# Patient Record
Sex: Female | Born: 1935 | Hispanic: No | Smoking: Never smoker
Health system: Southern US, Community
[De-identification: ages and names within clinical notes are randomized; demographics above are authoritative.]

## PROBLEM LIST (undated history)

## (undated) DIAGNOSIS — I4891 Unspecified atrial fibrillation: Secondary | ICD-10-CM

## (undated) DIAGNOSIS — I10 Essential (primary) hypertension: Secondary | ICD-10-CM

## (undated) DIAGNOSIS — G629 Polyneuropathy, unspecified: Secondary | ICD-10-CM

## (undated) DIAGNOSIS — K219 Gastro-esophageal reflux disease without esophagitis: Secondary | ICD-10-CM

## (undated) DIAGNOSIS — E119 Type 2 diabetes mellitus without complications: Secondary | ICD-10-CM

## (undated) DIAGNOSIS — N179 Acute kidney failure, unspecified: Secondary | ICD-10-CM

## (undated) DIAGNOSIS — R42 Dizziness and giddiness: Secondary | ICD-10-CM

## (undated) DIAGNOSIS — K635 Polyp of colon: Secondary | ICD-10-CM

## (undated) DIAGNOSIS — L84 Corns and callosities: Secondary | ICD-10-CM

## (undated) DIAGNOSIS — N189 Chronic kidney disease, unspecified: Secondary | ICD-10-CM

## (undated) DIAGNOSIS — F0280 Dementia in other diseases classified elsewhere without behavioral disturbance: Secondary | ICD-10-CM

## (undated) DIAGNOSIS — G309 Alzheimer's disease, unspecified: Secondary | ICD-10-CM

## (undated) DIAGNOSIS — I421 Obstructive hypertrophic cardiomyopathy: Secondary | ICD-10-CM

## (undated) DIAGNOSIS — M21619 Bunion of unspecified foot: Secondary | ICD-10-CM

## (undated) DIAGNOSIS — I495 Sick sinus syndrome: Secondary | ICD-10-CM

## (undated) DIAGNOSIS — I5189 Other ill-defined heart diseases: Secondary | ICD-10-CM

## (undated) DIAGNOSIS — M797 Fibromyalgia: Secondary | ICD-10-CM

## (undated) DIAGNOSIS — L8990 Pressure ulcer of unspecified site, unspecified stage: Secondary | ICD-10-CM

## (undated) DIAGNOSIS — R4182 Altered mental status, unspecified: Secondary | ICD-10-CM

## (undated) HISTORY — PX: EYE SURGERY: SHX253

## (undated) HISTORY — DX: Acute kidney failure, unspecified: N17.9

## (undated) HISTORY — PX: COLONOSCOPY: SHX174

## (undated) HISTORY — PX: UPPER GASTROINTESTINAL ENDOSCOPY: SHX188

## (undated) HISTORY — DX: Chronic kidney disease, unspecified: N18.9

## (undated) HISTORY — DX: Sick sinus syndrome: I49.5

## (undated) HISTORY — DX: Dizziness and giddiness: R42

## (undated) HISTORY — DX: Other ill-defined heart diseases: I51.89

## (undated) HISTORY — DX: Altered mental status, unspecified: R41.82

## (undated) HISTORY — PX: SHOULDER SURGERY: SHX246

## (undated) HISTORY — DX: Pressure ulcer of unspecified site, unspecified stage: L89.90

## (undated) HISTORY — DX: Obstructive hypertrophic cardiomyopathy: I42.1

## (undated) HISTORY — PX: HEMORRHOID SURGERY: SHX153

## (undated) HISTORY — DX: Bunion of unspecified foot: M21.619

## (undated) HISTORY — DX: Corns and callosities: L84

---

## 1999-01-30 ENCOUNTER — Other Ambulatory Visit: Admission: RE | Admit: 1999-01-30 | Discharge: 1999-01-30 | Payer: Self-pay | Admitting: Gynecology

## 1999-04-17 ENCOUNTER — Encounter (INDEPENDENT_AMBULATORY_CARE_PROVIDER_SITE_OTHER): Payer: Self-pay

## 1999-04-17 ENCOUNTER — Other Ambulatory Visit: Admission: RE | Admit: 1999-04-17 | Discharge: 1999-04-17 | Payer: Self-pay | Admitting: Gynecology

## 2000-03-30 ENCOUNTER — Other Ambulatory Visit: Admission: RE | Admit: 2000-03-30 | Discharge: 2000-03-30 | Payer: Self-pay | Admitting: Gynecology

## 2002-06-09 ENCOUNTER — Other Ambulatory Visit: Admission: RE | Admit: 2002-06-09 | Discharge: 2002-06-09 | Payer: Self-pay | Admitting: Gynecology

## 2004-07-09 ENCOUNTER — Other Ambulatory Visit: Admission: RE | Admit: 2004-07-09 | Discharge: 2004-07-09 | Payer: Self-pay | Admitting: Gynecology

## 2006-10-07 ENCOUNTER — Other Ambulatory Visit: Admission: RE | Admit: 2006-10-07 | Discharge: 2006-10-07 | Payer: Self-pay | Admitting: Gynecology

## 2010-06-03 DIAGNOSIS — M1990 Unspecified osteoarthritis, unspecified site: Secondary | ICD-10-CM | POA: Insufficient documentation

## 2010-06-03 DIAGNOSIS — K5792 Diverticulitis of intestine, part unspecified, without perforation or abscess without bleeding: Secondary | ICD-10-CM | POA: Insufficient documentation

## 2010-06-03 DIAGNOSIS — M797 Fibromyalgia: Secondary | ICD-10-CM | POA: Insufficient documentation

## 2010-12-16 DIAGNOSIS — N189 Chronic kidney disease, unspecified: Secondary | ICD-10-CM

## 2010-12-16 DIAGNOSIS — R32 Unspecified urinary incontinence: Secondary | ICD-10-CM | POA: Insufficient documentation

## 2010-12-16 HISTORY — DX: Chronic kidney disease, unspecified: N18.9

## 2011-01-27 DIAGNOSIS — M5137 Other intervertebral disc degeneration, lumbosacral region: Secondary | ICD-10-CM | POA: Insufficient documentation

## 2011-05-01 DIAGNOSIS — E119 Type 2 diabetes mellitus without complications: Secondary | ICD-10-CM

## 2011-05-01 DIAGNOSIS — I459 Conduction disorder, unspecified: Secondary | ICD-10-CM | POA: Insufficient documentation

## 2011-05-01 HISTORY — DX: Type 2 diabetes mellitus without complications: E11.9

## 2011-05-07 DIAGNOSIS — I495 Sick sinus syndrome: Secondary | ICD-10-CM

## 2011-05-07 DIAGNOSIS — I517 Cardiomegaly: Secondary | ICD-10-CM | POA: Insufficient documentation

## 2011-05-07 HISTORY — DX: Sick sinus syndrome: I49.5

## 2012-05-31 DIAGNOSIS — Q248 Other specified congenital malformations of heart: Secondary | ICD-10-CM | POA: Insufficient documentation

## 2012-05-31 DIAGNOSIS — I5189 Other ill-defined heart diseases: Secondary | ICD-10-CM

## 2012-05-31 HISTORY — DX: Other ill-defined heart diseases: I51.89

## 2013-06-29 DIAGNOSIS — I10 Essential (primary) hypertension: Secondary | ICD-10-CM | POA: Diagnosis not present

## 2013-06-29 DIAGNOSIS — M5137 Other intervertebral disc degeneration, lumbosacral region: Secondary | ICD-10-CM | POA: Diagnosis not present

## 2013-06-29 DIAGNOSIS — E785 Hyperlipidemia, unspecified: Secondary | ICD-10-CM | POA: Diagnosis not present

## 2013-06-29 DIAGNOSIS — E1149 Type 2 diabetes mellitus with other diabetic neurological complication: Secondary | ICD-10-CM | POA: Diagnosis not present

## 2013-06-29 DIAGNOSIS — E1142 Type 2 diabetes mellitus with diabetic polyneuropathy: Secondary | ICD-10-CM | POA: Diagnosis not present

## 2013-07-04 DIAGNOSIS — I359 Nonrheumatic aortic valve disorder, unspecified: Secondary | ICD-10-CM | POA: Diagnosis not present

## 2013-07-04 DIAGNOSIS — E1142 Type 2 diabetes mellitus with diabetic polyneuropathy: Secondary | ICD-10-CM | POA: Diagnosis not present

## 2013-07-04 DIAGNOSIS — I421 Obstructive hypertrophic cardiomyopathy: Secondary | ICD-10-CM | POA: Insufficient documentation

## 2013-07-04 DIAGNOSIS — I4949 Other premature depolarization: Secondary | ICD-10-CM | POA: Diagnosis not present

## 2013-07-04 DIAGNOSIS — I495 Sick sinus syndrome: Secondary | ICD-10-CM | POA: Diagnosis not present

## 2013-07-04 DIAGNOSIS — E1149 Type 2 diabetes mellitus with other diabetic neurological complication: Secondary | ICD-10-CM | POA: Diagnosis not present

## 2013-07-04 DIAGNOSIS — N189 Chronic kidney disease, unspecified: Secondary | ICD-10-CM | POA: Diagnosis not present

## 2013-07-04 DIAGNOSIS — I10 Essential (primary) hypertension: Secondary | ICD-10-CM | POA: Diagnosis not present

## 2013-07-04 DIAGNOSIS — I491 Atrial premature depolarization: Secondary | ICD-10-CM | POA: Diagnosis not present

## 2013-07-12 DIAGNOSIS — M2060 Acquired deformities of toe(s), unspecified, unspecified foot: Secondary | ICD-10-CM | POA: Insufficient documentation

## 2013-07-12 DIAGNOSIS — M7989 Other specified soft tissue disorders: Secondary | ICD-10-CM | POA: Diagnosis not present

## 2013-07-12 DIAGNOSIS — E1149 Type 2 diabetes mellitus with other diabetic neurological complication: Secondary | ICD-10-CM | POA: Diagnosis not present

## 2013-07-12 DIAGNOSIS — M2040 Other hammer toe(s) (acquired), unspecified foot: Secondary | ICD-10-CM | POA: Diagnosis not present

## 2013-07-12 DIAGNOSIS — E1142 Type 2 diabetes mellitus with diabetic polyneuropathy: Secondary | ICD-10-CM | POA: Diagnosis not present

## 2013-07-14 DIAGNOSIS — IMO0001 Reserved for inherently not codable concepts without codable children: Secondary | ICD-10-CM | POA: Diagnosis not present

## 2013-07-14 DIAGNOSIS — M5137 Other intervertebral disc degeneration, lumbosacral region: Secondary | ICD-10-CM | POA: Diagnosis not present

## 2013-08-19 DIAGNOSIS — R300 Dysuria: Secondary | ICD-10-CM | POA: Diagnosis not present

## 2013-09-02 DIAGNOSIS — M2040 Other hammer toe(s) (acquired), unspecified foot: Secondary | ICD-10-CM | POA: Diagnosis not present

## 2013-09-02 DIAGNOSIS — R0602 Shortness of breath: Secondary | ICD-10-CM | POA: Diagnosis not present

## 2013-09-02 DIAGNOSIS — E1149 Type 2 diabetes mellitus with other diabetic neurological complication: Secondary | ICD-10-CM | POA: Diagnosis not present

## 2013-09-02 DIAGNOSIS — E1142 Type 2 diabetes mellitus with diabetic polyneuropathy: Secondary | ICD-10-CM | POA: Diagnosis not present

## 2013-09-02 DIAGNOSIS — I129 Hypertensive chronic kidney disease with stage 1 through stage 4 chronic kidney disease, or unspecified chronic kidney disease: Secondary | ICD-10-CM | POA: Diagnosis not present

## 2013-09-02 DIAGNOSIS — M25519 Pain in unspecified shoulder: Secondary | ICD-10-CM | POA: Diagnosis not present

## 2013-09-02 DIAGNOSIS — N390 Urinary tract infection, site not specified: Secondary | ICD-10-CM | POA: Diagnosis not present

## 2013-09-05 DIAGNOSIS — E1142 Type 2 diabetes mellitus with diabetic polyneuropathy: Secondary | ICD-10-CM | POA: Diagnosis not present

## 2013-09-05 DIAGNOSIS — E1149 Type 2 diabetes mellitus with other diabetic neurological complication: Secondary | ICD-10-CM | POA: Diagnosis not present

## 2013-09-05 DIAGNOSIS — F329 Major depressive disorder, single episode, unspecified: Secondary | ICD-10-CM | POA: Diagnosis not present

## 2013-09-05 DIAGNOSIS — Z9181 History of falling: Secondary | ICD-10-CM | POA: Diagnosis not present

## 2013-09-05 DIAGNOSIS — I509 Heart failure, unspecified: Secondary | ICD-10-CM | POA: Diagnosis not present

## 2013-09-05 DIAGNOSIS — F3289 Other specified depressive episodes: Secondary | ICD-10-CM | POA: Diagnosis not present

## 2013-09-08 DIAGNOSIS — M19019 Primary osteoarthritis, unspecified shoulder: Secondary | ICD-10-CM | POA: Diagnosis not present

## 2013-09-08 DIAGNOSIS — F329 Major depressive disorder, single episode, unspecified: Secondary | ICD-10-CM | POA: Diagnosis not present

## 2013-09-08 DIAGNOSIS — E1149 Type 2 diabetes mellitus with other diabetic neurological complication: Secondary | ICD-10-CM | POA: Diagnosis not present

## 2013-09-08 DIAGNOSIS — E1142 Type 2 diabetes mellitus with diabetic polyneuropathy: Secondary | ICD-10-CM | POA: Diagnosis not present

## 2013-09-08 DIAGNOSIS — F3289 Other specified depressive episodes: Secondary | ICD-10-CM | POA: Diagnosis not present

## 2013-09-08 DIAGNOSIS — I509 Heart failure, unspecified: Secondary | ICD-10-CM | POA: Diagnosis not present

## 2013-09-08 DIAGNOSIS — M67919 Unspecified disorder of synovium and tendon, unspecified shoulder: Secondary | ICD-10-CM | POA: Diagnosis not present

## 2013-09-08 DIAGNOSIS — Z9181 History of falling: Secondary | ICD-10-CM | POA: Diagnosis not present

## 2013-09-09 DIAGNOSIS — I509 Heart failure, unspecified: Secondary | ICD-10-CM | POA: Diagnosis not present

## 2013-09-09 DIAGNOSIS — E1149 Type 2 diabetes mellitus with other diabetic neurological complication: Secondary | ICD-10-CM | POA: Diagnosis not present

## 2013-09-09 DIAGNOSIS — F3289 Other specified depressive episodes: Secondary | ICD-10-CM | POA: Diagnosis not present

## 2013-09-09 DIAGNOSIS — Z9181 History of falling: Secondary | ICD-10-CM | POA: Diagnosis not present

## 2013-09-09 DIAGNOSIS — E1142 Type 2 diabetes mellitus with diabetic polyneuropathy: Secondary | ICD-10-CM | POA: Diagnosis not present

## 2013-09-09 DIAGNOSIS — F329 Major depressive disorder, single episode, unspecified: Secondary | ICD-10-CM | POA: Diagnosis not present

## 2013-09-13 DIAGNOSIS — I509 Heart failure, unspecified: Secondary | ICD-10-CM | POA: Diagnosis not present

## 2013-09-13 DIAGNOSIS — E1142 Type 2 diabetes mellitus with diabetic polyneuropathy: Secondary | ICD-10-CM | POA: Diagnosis not present

## 2013-09-13 DIAGNOSIS — F329 Major depressive disorder, single episode, unspecified: Secondary | ICD-10-CM | POA: Diagnosis not present

## 2013-09-13 DIAGNOSIS — Z9181 History of falling: Secondary | ICD-10-CM | POA: Diagnosis not present

## 2013-09-13 DIAGNOSIS — E1149 Type 2 diabetes mellitus with other diabetic neurological complication: Secondary | ICD-10-CM | POA: Diagnosis not present

## 2013-09-13 DIAGNOSIS — F3289 Other specified depressive episodes: Secondary | ICD-10-CM | POA: Diagnosis not present

## 2013-09-13 DIAGNOSIS — M2040 Other hammer toe(s) (acquired), unspecified foot: Secondary | ICD-10-CM | POA: Diagnosis not present

## 2013-09-13 DIAGNOSIS — L84 Corns and callosities: Secondary | ICD-10-CM | POA: Diagnosis not present

## 2013-09-15 DIAGNOSIS — Z9181 History of falling: Secondary | ICD-10-CM | POA: Diagnosis not present

## 2013-09-15 DIAGNOSIS — F329 Major depressive disorder, single episode, unspecified: Secondary | ICD-10-CM | POA: Diagnosis not present

## 2013-09-15 DIAGNOSIS — E1149 Type 2 diabetes mellitus with other diabetic neurological complication: Secondary | ICD-10-CM | POA: Diagnosis not present

## 2013-09-15 DIAGNOSIS — F3289 Other specified depressive episodes: Secondary | ICD-10-CM | POA: Diagnosis not present

## 2013-09-15 DIAGNOSIS — E1142 Type 2 diabetes mellitus with diabetic polyneuropathy: Secondary | ICD-10-CM | POA: Diagnosis not present

## 2013-09-15 DIAGNOSIS — I509 Heart failure, unspecified: Secondary | ICD-10-CM | POA: Diagnosis not present

## 2013-09-16 DIAGNOSIS — Z9181 History of falling: Secondary | ICD-10-CM | POA: Diagnosis not present

## 2013-09-16 DIAGNOSIS — I509 Heart failure, unspecified: Secondary | ICD-10-CM | POA: Diagnosis not present

## 2013-09-16 DIAGNOSIS — E1142 Type 2 diabetes mellitus with diabetic polyneuropathy: Secondary | ICD-10-CM | POA: Diagnosis not present

## 2013-09-16 DIAGNOSIS — F329 Major depressive disorder, single episode, unspecified: Secondary | ICD-10-CM | POA: Diagnosis not present

## 2013-09-16 DIAGNOSIS — F3289 Other specified depressive episodes: Secondary | ICD-10-CM | POA: Diagnosis not present

## 2013-09-16 DIAGNOSIS — E1149 Type 2 diabetes mellitus with other diabetic neurological complication: Secondary | ICD-10-CM | POA: Diagnosis not present

## 2013-09-20 DIAGNOSIS — E1142 Type 2 diabetes mellitus with diabetic polyneuropathy: Secondary | ICD-10-CM | POA: Diagnosis not present

## 2013-09-20 DIAGNOSIS — F3289 Other specified depressive episodes: Secondary | ICD-10-CM | POA: Diagnosis not present

## 2013-09-20 DIAGNOSIS — Z9181 History of falling: Secondary | ICD-10-CM | POA: Diagnosis not present

## 2013-09-20 DIAGNOSIS — F329 Major depressive disorder, single episode, unspecified: Secondary | ICD-10-CM | POA: Diagnosis not present

## 2013-09-20 DIAGNOSIS — I509 Heart failure, unspecified: Secondary | ICD-10-CM | POA: Diagnosis not present

## 2013-09-20 DIAGNOSIS — E1149 Type 2 diabetes mellitus with other diabetic neurological complication: Secondary | ICD-10-CM | POA: Diagnosis not present

## 2013-09-22 DIAGNOSIS — I509 Heart failure, unspecified: Secondary | ICD-10-CM | POA: Diagnosis not present

## 2013-09-22 DIAGNOSIS — F329 Major depressive disorder, single episode, unspecified: Secondary | ICD-10-CM | POA: Diagnosis not present

## 2013-09-22 DIAGNOSIS — E1149 Type 2 diabetes mellitus with other diabetic neurological complication: Secondary | ICD-10-CM | POA: Diagnosis not present

## 2013-09-22 DIAGNOSIS — E1142 Type 2 diabetes mellitus with diabetic polyneuropathy: Secondary | ICD-10-CM | POA: Diagnosis not present

## 2013-09-22 DIAGNOSIS — F3289 Other specified depressive episodes: Secondary | ICD-10-CM | POA: Diagnosis not present

## 2013-09-22 DIAGNOSIS — Z9181 History of falling: Secondary | ICD-10-CM | POA: Diagnosis not present

## 2013-10-04 DIAGNOSIS — E1149 Type 2 diabetes mellitus with other diabetic neurological complication: Secondary | ICD-10-CM | POA: Diagnosis not present

## 2013-10-04 DIAGNOSIS — F3289 Other specified depressive episodes: Secondary | ICD-10-CM | POA: Diagnosis not present

## 2013-10-04 DIAGNOSIS — Z9181 History of falling: Secondary | ICD-10-CM | POA: Diagnosis not present

## 2013-10-04 DIAGNOSIS — I509 Heart failure, unspecified: Secondary | ICD-10-CM | POA: Diagnosis not present

## 2013-10-04 DIAGNOSIS — F329 Major depressive disorder, single episode, unspecified: Secondary | ICD-10-CM | POA: Diagnosis not present

## 2013-10-04 DIAGNOSIS — E1142 Type 2 diabetes mellitus with diabetic polyneuropathy: Secondary | ICD-10-CM | POA: Diagnosis not present

## 2013-10-11 DIAGNOSIS — M2010 Hallux valgus (acquired), unspecified foot: Secondary | ICD-10-CM | POA: Diagnosis not present

## 2013-10-11 DIAGNOSIS — M2040 Other hammer toe(s) (acquired), unspecified foot: Secondary | ICD-10-CM | POA: Diagnosis not present

## 2013-10-11 DIAGNOSIS — L84 Corns and callosities: Secondary | ICD-10-CM | POA: Diagnosis not present

## 2013-10-11 DIAGNOSIS — M216X9 Other acquired deformities of unspecified foot: Secondary | ICD-10-CM | POA: Diagnosis not present

## 2013-11-24 DIAGNOSIS — R413 Other amnesia: Secondary | ICD-10-CM | POA: Diagnosis not present

## 2013-11-24 DIAGNOSIS — I10 Essential (primary) hypertension: Secondary | ICD-10-CM | POA: Diagnosis not present

## 2013-11-24 DIAGNOSIS — M5137 Other intervertebral disc degeneration, lumbosacral region: Secondary | ICD-10-CM | POA: Diagnosis not present

## 2013-11-24 DIAGNOSIS — E1149 Type 2 diabetes mellitus with other diabetic neurological complication: Secondary | ICD-10-CM | POA: Diagnosis not present

## 2013-11-24 DIAGNOSIS — E1142 Type 2 diabetes mellitus with diabetic polyneuropathy: Secondary | ICD-10-CM | POA: Diagnosis not present

## 2013-11-24 DIAGNOSIS — E785 Hyperlipidemia, unspecified: Secondary | ICD-10-CM | POA: Diagnosis not present

## 2013-12-12 DIAGNOSIS — G3184 Mild cognitive impairment, so stated: Secondary | ICD-10-CM | POA: Diagnosis not present

## 2013-12-13 DIAGNOSIS — H3531 Nonexudative age-related macular degeneration: Secondary | ICD-10-CM | POA: Diagnosis not present

## 2013-12-13 DIAGNOSIS — Z961 Presence of intraocular lens: Secondary | ICD-10-CM | POA: Diagnosis not present

## 2013-12-13 DIAGNOSIS — E11319 Type 2 diabetes mellitus with unspecified diabetic retinopathy without macular edema: Secondary | ICD-10-CM | POA: Diagnosis not present

## 2013-12-13 DIAGNOSIS — H35059 Retinal neovascularization, unspecified, unspecified eye: Secondary | ICD-10-CM | POA: Diagnosis not present

## 2014-01-04 DIAGNOSIS — H43813 Vitreous degeneration, bilateral: Secondary | ICD-10-CM | POA: Diagnosis not present

## 2014-01-04 DIAGNOSIS — H43393 Other vitreous opacities, bilateral: Secondary | ICD-10-CM | POA: Diagnosis not present

## 2014-01-04 DIAGNOSIS — H3531 Nonexudative age-related macular degeneration: Secondary | ICD-10-CM | POA: Diagnosis not present

## 2014-01-12 DIAGNOSIS — Z23 Encounter for immunization: Secondary | ICD-10-CM | POA: Diagnosis not present

## 2014-02-09 DIAGNOSIS — J0100 Acute maxillary sinusitis, unspecified: Secondary | ICD-10-CM | POA: Diagnosis not present

## 2014-02-09 DIAGNOSIS — H66003 Acute suppurative otitis media without spontaneous rupture of ear drum, bilateral: Secondary | ICD-10-CM | POA: Diagnosis not present

## 2014-02-24 DIAGNOSIS — I350 Nonrheumatic aortic (valve) stenosis: Secondary | ICD-10-CM | POA: Diagnosis not present

## 2014-02-24 DIAGNOSIS — E785 Hyperlipidemia, unspecified: Secondary | ICD-10-CM | POA: Diagnosis not present

## 2014-02-24 DIAGNOSIS — E0843 Diabetes mellitus due to underlying condition with diabetic autonomic (poly)neuropathy: Secondary | ICD-10-CM | POA: Diagnosis not present

## 2014-02-24 DIAGNOSIS — M5137 Other intervertebral disc degeneration, lumbosacral region: Secondary | ICD-10-CM | POA: Diagnosis not present

## 2014-02-24 DIAGNOSIS — I10 Essential (primary) hypertension: Secondary | ICD-10-CM | POA: Diagnosis not present

## 2014-02-24 DIAGNOSIS — J029 Acute pharyngitis, unspecified: Secondary | ICD-10-CM | POA: Diagnosis not present

## 2014-05-21 DIAGNOSIS — E1142 Type 2 diabetes mellitus with diabetic polyneuropathy: Secondary | ICD-10-CM | POA: Insufficient documentation

## 2014-05-21 DIAGNOSIS — K219 Gastro-esophageal reflux disease without esophagitis: Secondary | ICD-10-CM | POA: Insufficient documentation

## 2014-06-01 DIAGNOSIS — I10 Essential (primary) hypertension: Secondary | ICD-10-CM | POA: Diagnosis not present

## 2014-06-01 DIAGNOSIS — M25512 Pain in left shoulder: Secondary | ICD-10-CM | POA: Diagnosis not present

## 2014-06-01 DIAGNOSIS — E785 Hyperlipidemia, unspecified: Secondary | ICD-10-CM | POA: Diagnosis not present

## 2014-06-01 DIAGNOSIS — F418 Other specified anxiety disorders: Secondary | ICD-10-CM | POA: Diagnosis not present

## 2014-06-01 DIAGNOSIS — E119 Type 2 diabetes mellitus without complications: Secondary | ICD-10-CM | POA: Diagnosis not present

## 2014-06-01 DIAGNOSIS — G8929 Other chronic pain: Secondary | ICD-10-CM | POA: Diagnosis not present

## 2014-06-01 DIAGNOSIS — K219 Gastro-esophageal reflux disease without esophagitis: Secondary | ICD-10-CM | POA: Diagnosis not present

## 2014-06-01 DIAGNOSIS — E0842 Diabetes mellitus due to underlying condition with diabetic polyneuropathy: Secondary | ICD-10-CM | POA: Diagnosis not present

## 2014-06-01 DIAGNOSIS — M25519 Pain in unspecified shoulder: Secondary | ICD-10-CM | POA: Diagnosis not present

## 2014-06-01 DIAGNOSIS — M542 Cervicalgia: Secondary | ICD-10-CM | POA: Diagnosis not present

## 2014-06-13 DIAGNOSIS — I10 Essential (primary) hypertension: Secondary | ICD-10-CM | POA: Diagnosis not present

## 2014-06-13 DIAGNOSIS — E119 Type 2 diabetes mellitus without complications: Secondary | ICD-10-CM | POA: Diagnosis not present

## 2014-06-13 DIAGNOSIS — E785 Hyperlipidemia, unspecified: Secondary | ICD-10-CM | POA: Diagnosis not present

## 2014-07-21 DIAGNOSIS — M12812 Other specific arthropathies, not elsewhere classified, left shoulder: Secondary | ICD-10-CM | POA: Diagnosis not present

## 2014-07-21 DIAGNOSIS — M12819 Other specific arthropathies, not elsewhere classified, unspecified shoulder: Secondary | ICD-10-CM | POA: Insufficient documentation

## 2014-07-21 DIAGNOSIS — M25512 Pain in left shoulder: Secondary | ICD-10-CM | POA: Diagnosis not present

## 2014-07-21 DIAGNOSIS — G8929 Other chronic pain: Secondary | ICD-10-CM | POA: Diagnosis not present

## 2014-09-17 DIAGNOSIS — N183 Chronic kidney disease, stage 3 unspecified: Secondary | ICD-10-CM | POA: Insufficient documentation

## 2014-09-19 DIAGNOSIS — I10 Essential (primary) hypertension: Secondary | ICD-10-CM | POA: Diagnosis not present

## 2014-09-19 DIAGNOSIS — E785 Hyperlipidemia, unspecified: Secondary | ICD-10-CM | POA: Diagnosis not present

## 2014-09-19 DIAGNOSIS — N183 Chronic kidney disease, stage 3 (moderate): Secondary | ICD-10-CM | POA: Diagnosis not present

## 2014-09-19 DIAGNOSIS — E0842 Diabetes mellitus due to underlying condition with diabetic polyneuropathy: Secondary | ICD-10-CM | POA: Diagnosis not present

## 2014-09-21 DIAGNOSIS — E0842 Diabetes mellitus due to underlying condition with diabetic polyneuropathy: Secondary | ICD-10-CM | POA: Diagnosis not present

## 2014-09-21 DIAGNOSIS — M25512 Pain in left shoulder: Secondary | ICD-10-CM | POA: Diagnosis not present

## 2014-09-21 DIAGNOSIS — I10 Essential (primary) hypertension: Secondary | ICD-10-CM | POA: Diagnosis not present

## 2014-09-21 DIAGNOSIS — M12812 Other specific arthropathies, not elsewhere classified, left shoulder: Secondary | ICD-10-CM | POA: Diagnosis not present

## 2014-09-21 DIAGNOSIS — D492 Neoplasm of unspecified behavior of bone, soft tissue, and skin: Secondary | ICD-10-CM | POA: Diagnosis not present

## 2014-09-21 DIAGNOSIS — E785 Hyperlipidemia, unspecified: Secondary | ICD-10-CM | POA: Diagnosis not present

## 2014-09-21 DIAGNOSIS — G8929 Other chronic pain: Secondary | ICD-10-CM | POA: Diagnosis not present

## 2014-10-18 DIAGNOSIS — L570 Actinic keratosis: Secondary | ICD-10-CM | POA: Diagnosis not present

## 2014-10-18 DIAGNOSIS — L821 Other seborrheic keratosis: Secondary | ICD-10-CM | POA: Diagnosis not present

## 2014-10-24 DIAGNOSIS — M79675 Pain in left toe(s): Secondary | ICD-10-CM | POA: Diagnosis not present

## 2014-10-24 DIAGNOSIS — M2042 Other hammer toe(s) (acquired), left foot: Secondary | ICD-10-CM | POA: Diagnosis not present

## 2014-10-24 DIAGNOSIS — M79674 Pain in right toe(s): Secondary | ICD-10-CM | POA: Diagnosis not present

## 2014-10-24 DIAGNOSIS — L602 Onychogryphosis: Secondary | ICD-10-CM | POA: Diagnosis not present

## 2014-10-24 DIAGNOSIS — M2012 Hallux valgus (acquired), left foot: Secondary | ICD-10-CM | POA: Diagnosis not present

## 2014-10-24 DIAGNOSIS — E118 Type 2 diabetes mellitus with unspecified complications: Secondary | ICD-10-CM | POA: Diagnosis not present

## 2014-10-24 DIAGNOSIS — M2041 Other hammer toe(s) (acquired), right foot: Secondary | ICD-10-CM | POA: Diagnosis not present

## 2014-10-24 DIAGNOSIS — M2011 Hallux valgus (acquired), right foot: Secondary | ICD-10-CM | POA: Diagnosis not present

## 2014-10-31 DIAGNOSIS — S4992XA Unspecified injury of left shoulder and upper arm, initial encounter: Secondary | ICD-10-CM | POA: Diagnosis not present

## 2014-10-31 DIAGNOSIS — S0990XA Unspecified injury of head, initial encounter: Secondary | ICD-10-CM | POA: Diagnosis not present

## 2014-10-31 DIAGNOSIS — M19012 Primary osteoarthritis, left shoulder: Secondary | ICD-10-CM | POA: Diagnosis not present

## 2014-10-31 DIAGNOSIS — F332 Major depressive disorder, recurrent severe without psychotic features: Secondary | ICD-10-CM | POA: Diagnosis not present

## 2014-10-31 DIAGNOSIS — G319 Degenerative disease of nervous system, unspecified: Secondary | ICD-10-CM | POA: Diagnosis not present

## 2014-10-31 DIAGNOSIS — T148 Other injury of unspecified body region: Secondary | ICD-10-CM | POA: Diagnosis not present

## 2014-10-31 DIAGNOSIS — F329 Major depressive disorder, single episode, unspecified: Secondary | ICD-10-CM | POA: Diagnosis not present

## 2014-10-31 DIAGNOSIS — I421 Obstructive hypertrophic cardiomyopathy: Secondary | ICD-10-CM | POA: Diagnosis not present

## 2014-10-31 DIAGNOSIS — Z7982 Long term (current) use of aspirin: Secondary | ICD-10-CM | POA: Diagnosis not present

## 2014-10-31 DIAGNOSIS — R531 Weakness: Secondary | ICD-10-CM | POA: Diagnosis not present

## 2014-10-31 DIAGNOSIS — Z888 Allergy status to other drugs, medicaments and biological substances status: Secondary | ICD-10-CM | POA: Diagnosis not present

## 2014-10-31 DIAGNOSIS — S40012A Contusion of left shoulder, initial encounter: Secondary | ICD-10-CM | POA: Diagnosis not present

## 2014-10-31 DIAGNOSIS — E1122 Type 2 diabetes mellitus with diabetic chronic kidney disease: Secondary | ICD-10-CM | POA: Diagnosis not present

## 2014-10-31 DIAGNOSIS — M542 Cervicalgia: Secondary | ICD-10-CM | POA: Diagnosis not present

## 2014-10-31 DIAGNOSIS — W1839XA Other fall on same level, initial encounter: Secondary | ICD-10-CM | POA: Diagnosis not present

## 2014-10-31 DIAGNOSIS — Z79899 Other long term (current) drug therapy: Secondary | ICD-10-CM | POA: Diagnosis not present

## 2014-11-01 DIAGNOSIS — R45851 Suicidal ideations: Secondary | ICD-10-CM | POA: Diagnosis present

## 2014-11-01 DIAGNOSIS — I509 Heart failure, unspecified: Secondary | ICD-10-CM | POA: Diagnosis present

## 2014-11-01 DIAGNOSIS — I129 Hypertensive chronic kidney disease with stage 1 through stage 4 chronic kidney disease, or unspecified chronic kidney disease: Secondary | ICD-10-CM | POA: Diagnosis present

## 2014-11-01 DIAGNOSIS — R1312 Dysphagia, oropharyngeal phase: Secondary | ICD-10-CM | POA: Diagnosis not present

## 2014-11-01 DIAGNOSIS — Z888 Allergy status to other drugs, medicaments and biological substances status: Secondary | ICD-10-CM | POA: Diagnosis not present

## 2014-11-01 DIAGNOSIS — R2681 Unsteadiness on feet: Secondary | ICD-10-CM | POA: Diagnosis not present

## 2014-11-01 DIAGNOSIS — M797 Fibromyalgia: Secondary | ICD-10-CM | POA: Diagnosis present

## 2014-11-01 DIAGNOSIS — J302 Other seasonal allergic rhinitis: Secondary | ICD-10-CM | POA: Diagnosis present

## 2014-11-01 DIAGNOSIS — F329 Major depressive disorder, single episode, unspecified: Secondary | ICD-10-CM | POA: Diagnosis present

## 2014-11-01 DIAGNOSIS — K219 Gastro-esophageal reflux disease without esophagitis: Secondary | ICD-10-CM | POA: Diagnosis present

## 2014-11-01 DIAGNOSIS — G308 Other Alzheimer's disease: Secondary | ICD-10-CM | POA: Diagnosis not present

## 2014-11-01 DIAGNOSIS — M12812 Other specific arthropathies, not elsewhere classified, left shoulder: Secondary | ICD-10-CM | POA: Diagnosis not present

## 2014-11-01 DIAGNOSIS — N183 Chronic kidney disease, stage 3 (moderate): Secondary | ICD-10-CM | POA: Diagnosis not present

## 2014-11-01 DIAGNOSIS — R32 Unspecified urinary incontinence: Secondary | ICD-10-CM | POA: Diagnosis present

## 2014-11-01 DIAGNOSIS — E0842 Diabetes mellitus due to underlying condition with diabetic polyneuropathy: Secondary | ICD-10-CM | POA: Diagnosis not present

## 2014-11-01 DIAGNOSIS — I421 Obstructive hypertrophic cardiomyopathy: Secondary | ICD-10-CM | POA: Diagnosis present

## 2014-11-01 DIAGNOSIS — E1142 Type 2 diabetes mellitus with diabetic polyneuropathy: Secondary | ICD-10-CM | POA: Diagnosis present

## 2014-11-01 DIAGNOSIS — N179 Acute kidney failure, unspecified: Secondary | ICD-10-CM | POA: Diagnosis not present

## 2014-11-01 DIAGNOSIS — F0281 Dementia in other diseases classified elsewhere with behavioral disturbance: Secondary | ICD-10-CM | POA: Diagnosis not present

## 2014-11-01 DIAGNOSIS — M6281 Muscle weakness (generalized): Secondary | ICD-10-CM | POA: Diagnosis not present

## 2014-11-01 DIAGNOSIS — M479 Spondylosis, unspecified: Secondary | ICD-10-CM | POA: Diagnosis present

## 2014-11-01 DIAGNOSIS — E785 Hyperlipidemia, unspecified: Secondary | ICD-10-CM | POA: Diagnosis present

## 2014-11-01 DIAGNOSIS — E1122 Type 2 diabetes mellitus with diabetic chronic kidney disease: Secondary | ICD-10-CM | POA: Diagnosis present

## 2014-11-01 DIAGNOSIS — M5137 Other intervertebral disc degeneration, lumbosacral region: Secondary | ICD-10-CM | POA: Diagnosis not present

## 2014-11-01 DIAGNOSIS — F99 Mental disorder, not otherwise specified: Secondary | ICD-10-CM | POA: Diagnosis not present

## 2014-11-01 DIAGNOSIS — S40012A Contusion of left shoulder, initial encounter: Secondary | ICD-10-CM | POA: Diagnosis present

## 2014-11-01 DIAGNOSIS — E538 Deficiency of other specified B group vitamins: Secondary | ICD-10-CM | POA: Diagnosis present

## 2014-11-01 DIAGNOSIS — I350 Nonrheumatic aortic (valve) stenosis: Secondary | ICD-10-CM | POA: Diagnosis present

## 2014-11-01 DIAGNOSIS — Z9181 History of falling: Secondary | ICD-10-CM | POA: Diagnosis not present

## 2014-11-01 DIAGNOSIS — N189 Chronic kidney disease, unspecified: Secondary | ICD-10-CM | POA: Diagnosis not present

## 2014-11-01 DIAGNOSIS — M47817 Spondylosis without myelopathy or radiculopathy, lumbosacral region: Secondary | ICD-10-CM | POA: Diagnosis present

## 2014-11-01 DIAGNOSIS — Z79899 Other long term (current) drug therapy: Secondary | ICD-10-CM | POA: Diagnosis not present

## 2014-11-01 DIAGNOSIS — G309 Alzheimer's disease, unspecified: Secondary | ICD-10-CM | POA: Diagnosis present

## 2014-11-01 DIAGNOSIS — Z7951 Long term (current) use of inhaled steroids: Secondary | ICD-10-CM | POA: Diagnosis not present

## 2014-11-01 DIAGNOSIS — Z794 Long term (current) use of insulin: Secondary | ICD-10-CM | POA: Diagnosis not present

## 2014-11-01 DIAGNOSIS — F332 Major depressive disorder, recurrent severe without psychotic features: Secondary | ICD-10-CM | POA: Diagnosis present

## 2014-11-01 DIAGNOSIS — I10 Essential (primary) hypertension: Secondary | ICD-10-CM | POA: Diagnosis not present

## 2014-11-01 DIAGNOSIS — Z7982 Long term (current) use of aspirin: Secondary | ICD-10-CM | POA: Diagnosis not present

## 2014-11-02 DIAGNOSIS — F332 Major depressive disorder, recurrent severe without psychotic features: Secondary | ICD-10-CM | POA: Insufficient documentation

## 2014-11-03 DIAGNOSIS — F0281 Dementia in other diseases classified elsewhere with behavioral disturbance: Secondary | ICD-10-CM | POA: Insufficient documentation

## 2014-11-03 DIAGNOSIS — G309 Alzheimer's disease, unspecified: Secondary | ICD-10-CM | POA: Insufficient documentation

## 2014-11-07 DIAGNOSIS — H9190 Unspecified hearing loss, unspecified ear: Secondary | ICD-10-CM | POA: Insufficient documentation

## 2014-11-14 DIAGNOSIS — R490 Dysphonia: Secondary | ICD-10-CM | POA: Diagnosis not present

## 2014-11-14 DIAGNOSIS — R41841 Cognitive communication deficit: Secondary | ICD-10-CM | POA: Diagnosis not present

## 2014-11-14 DIAGNOSIS — M6281 Muscle weakness (generalized): Secondary | ICD-10-CM | POA: Diagnosis not present

## 2014-11-14 DIAGNOSIS — R488 Other symbolic dysfunctions: Secondary | ICD-10-CM | POA: Diagnosis not present

## 2014-11-14 DIAGNOSIS — R296 Repeated falls: Secondary | ICD-10-CM | POA: Diagnosis not present

## 2014-11-14 DIAGNOSIS — R2681 Unsteadiness on feet: Secondary | ICD-10-CM | POA: Diagnosis not present

## 2014-11-15 DIAGNOSIS — R490 Dysphonia: Secondary | ICD-10-CM | POA: Diagnosis not present

## 2014-11-15 DIAGNOSIS — R41841 Cognitive communication deficit: Secondary | ICD-10-CM | POA: Diagnosis not present

## 2014-11-15 DIAGNOSIS — M6281 Muscle weakness (generalized): Secondary | ICD-10-CM | POA: Diagnosis not present

## 2014-11-15 DIAGNOSIS — R488 Other symbolic dysfunctions: Secondary | ICD-10-CM | POA: Diagnosis not present

## 2014-11-15 DIAGNOSIS — R2681 Unsteadiness on feet: Secondary | ICD-10-CM | POA: Diagnosis not present

## 2014-11-15 DIAGNOSIS — R296 Repeated falls: Secondary | ICD-10-CM | POA: Diagnosis not present

## 2014-11-16 DIAGNOSIS — R41841 Cognitive communication deficit: Secondary | ICD-10-CM | POA: Diagnosis not present

## 2014-11-16 DIAGNOSIS — R488 Other symbolic dysfunctions: Secondary | ICD-10-CM | POA: Diagnosis not present

## 2014-11-16 DIAGNOSIS — M6281 Muscle weakness (generalized): Secondary | ICD-10-CM | POA: Diagnosis not present

## 2014-11-16 DIAGNOSIS — R490 Dysphonia: Secondary | ICD-10-CM | POA: Diagnosis not present

## 2014-11-16 DIAGNOSIS — R296 Repeated falls: Secondary | ICD-10-CM | POA: Diagnosis not present

## 2014-11-16 DIAGNOSIS — R2681 Unsteadiness on feet: Secondary | ICD-10-CM | POA: Diagnosis not present

## 2014-11-17 DIAGNOSIS — R296 Repeated falls: Secondary | ICD-10-CM | POA: Diagnosis not present

## 2014-11-17 DIAGNOSIS — R488 Other symbolic dysfunctions: Secondary | ICD-10-CM | POA: Diagnosis not present

## 2014-11-17 DIAGNOSIS — R2681 Unsteadiness on feet: Secondary | ICD-10-CM | POA: Diagnosis not present

## 2014-11-17 DIAGNOSIS — R41841 Cognitive communication deficit: Secondary | ICD-10-CM | POA: Diagnosis not present

## 2014-11-17 DIAGNOSIS — R490 Dysphonia: Secondary | ICD-10-CM | POA: Diagnosis not present

## 2014-11-17 DIAGNOSIS — M6281 Muscle weakness (generalized): Secondary | ICD-10-CM | POA: Diagnosis not present

## 2014-11-20 DIAGNOSIS — R2681 Unsteadiness on feet: Secondary | ICD-10-CM | POA: Diagnosis not present

## 2014-11-20 DIAGNOSIS — R296 Repeated falls: Secondary | ICD-10-CM | POA: Diagnosis not present

## 2014-11-20 DIAGNOSIS — M6281 Muscle weakness (generalized): Secondary | ICD-10-CM | POA: Diagnosis not present

## 2014-11-20 DIAGNOSIS — R490 Dysphonia: Secondary | ICD-10-CM | POA: Diagnosis not present

## 2014-11-20 DIAGNOSIS — R41841 Cognitive communication deficit: Secondary | ICD-10-CM | POA: Diagnosis not present

## 2014-11-20 DIAGNOSIS — R488 Other symbolic dysfunctions: Secondary | ICD-10-CM | POA: Diagnosis not present

## 2014-11-21 DIAGNOSIS — R296 Repeated falls: Secondary | ICD-10-CM | POA: Diagnosis not present

## 2014-11-21 DIAGNOSIS — R2681 Unsteadiness on feet: Secondary | ICD-10-CM | POA: Diagnosis not present

## 2014-11-21 DIAGNOSIS — M6281 Muscle weakness (generalized): Secondary | ICD-10-CM | POA: Diagnosis not present

## 2014-11-21 DIAGNOSIS — R41841 Cognitive communication deficit: Secondary | ICD-10-CM | POA: Diagnosis not present

## 2014-11-21 DIAGNOSIS — R488 Other symbolic dysfunctions: Secondary | ICD-10-CM | POA: Diagnosis not present

## 2014-11-21 DIAGNOSIS — R490 Dysphonia: Secondary | ICD-10-CM | POA: Diagnosis not present

## 2014-11-22 DIAGNOSIS — R490 Dysphonia: Secondary | ICD-10-CM | POA: Diagnosis not present

## 2014-11-22 DIAGNOSIS — R2681 Unsteadiness on feet: Secondary | ICD-10-CM | POA: Diagnosis not present

## 2014-11-22 DIAGNOSIS — R488 Other symbolic dysfunctions: Secondary | ICD-10-CM | POA: Diagnosis not present

## 2014-11-22 DIAGNOSIS — R41841 Cognitive communication deficit: Secondary | ICD-10-CM | POA: Diagnosis not present

## 2014-11-22 DIAGNOSIS — M6281 Muscle weakness (generalized): Secondary | ICD-10-CM | POA: Diagnosis not present

## 2014-11-22 DIAGNOSIS — R296 Repeated falls: Secondary | ICD-10-CM | POA: Diagnosis not present

## 2014-11-23 ENCOUNTER — Encounter (HOSPITAL_COMMUNITY): Payer: Self-pay | Admitting: Emergency Medicine

## 2014-11-23 ENCOUNTER — Inpatient Hospital Stay (HOSPITAL_COMMUNITY)
Admission: EM | Admit: 2014-11-23 | Discharge: 2014-11-27 | DRG: 683 | Disposition: A | Discharge status: Skilled Nursing Facility | Payer: Medicare Other | Attending: Internal Medicine | Admitting: Internal Medicine

## 2014-11-23 DIAGNOSIS — N179 Acute kidney failure, unspecified: Secondary | ICD-10-CM

## 2014-11-23 DIAGNOSIS — R488 Other symbolic dysfunctions: Secondary | ICD-10-CM | POA: Diagnosis not present

## 2014-11-23 DIAGNOSIS — R4182 Altered mental status, unspecified: Secondary | ICD-10-CM | POA: Diagnosis not present

## 2014-11-23 DIAGNOSIS — R2681 Unsteadiness on feet: Secondary | ICD-10-CM | POA: Diagnosis not present

## 2014-11-23 DIAGNOSIS — R41841 Cognitive communication deficit: Secondary | ICD-10-CM | POA: Diagnosis not present

## 2014-11-23 DIAGNOSIS — F0280 Dementia in other diseases classified elsewhere without behavioral disturbance: Secondary | ICD-10-CM | POA: Diagnosis present

## 2014-11-23 DIAGNOSIS — N390 Urinary tract infection, site not specified: Secondary | ICD-10-CM | POA: Diagnosis present

## 2014-11-23 DIAGNOSIS — R778 Other specified abnormalities of plasma proteins: Secondary | ICD-10-CM | POA: Diagnosis present

## 2014-11-23 DIAGNOSIS — I129 Hypertensive chronic kidney disease with stage 1 through stage 4 chronic kidney disease, or unspecified chronic kidney disease: Secondary | ICD-10-CM | POA: Diagnosis present

## 2014-11-23 DIAGNOSIS — Z7982 Long term (current) use of aspirin: Secondary | ICD-10-CM

## 2014-11-23 DIAGNOSIS — Z833 Family history of diabetes mellitus: Secondary | ICD-10-CM

## 2014-11-23 DIAGNOSIS — B961 Klebsiella pneumoniae [K. pneumoniae] as the cause of diseases classified elsewhere: Secondary | ICD-10-CM | POA: Diagnosis present

## 2014-11-23 DIAGNOSIS — R531 Weakness: Secondary | ICD-10-CM | POA: Diagnosis not present

## 2014-11-23 DIAGNOSIS — E1122 Type 2 diabetes mellitus with diabetic chronic kidney disease: Secondary | ICD-10-CM | POA: Diagnosis present

## 2014-11-23 DIAGNOSIS — R419 Unspecified symptoms and signs involving cognitive functions and awareness: Secondary | ICD-10-CM | POA: Diagnosis not present

## 2014-11-23 DIAGNOSIS — L89151 Pressure ulcer of sacral region, stage 1: Secondary | ICD-10-CM | POA: Diagnosis present

## 2014-11-23 DIAGNOSIS — G309 Alzheimer's disease, unspecified: Secondary | ICD-10-CM | POA: Diagnosis present

## 2014-11-23 DIAGNOSIS — E876 Hypokalemia: Secondary | ICD-10-CM | POA: Diagnosis present

## 2014-11-23 DIAGNOSIS — R7989 Other specified abnormal findings of blood chemistry: Secondary | ICD-10-CM | POA: Diagnosis present

## 2014-11-23 DIAGNOSIS — I10 Essential (primary) hypertension: Secondary | ICD-10-CM | POA: Diagnosis not present

## 2014-11-23 DIAGNOSIS — Z79899 Other long term (current) drug therapy: Secondary | ICD-10-CM | POA: Diagnosis not present

## 2014-11-23 DIAGNOSIS — F329 Major depressive disorder, single episode, unspecified: Secondary | ICD-10-CM | POA: Diagnosis present

## 2014-11-23 DIAGNOSIS — M797 Fibromyalgia: Secondary | ICD-10-CM | POA: Diagnosis present

## 2014-11-23 DIAGNOSIS — Z66 Do not resuscitate: Secondary | ICD-10-CM | POA: Diagnosis present

## 2014-11-23 DIAGNOSIS — K219 Gastro-esophageal reflux disease without esophagitis: Secondary | ICD-10-CM | POA: Diagnosis present

## 2014-11-23 DIAGNOSIS — Z86010 Personal history of colonic polyps: Secondary | ICD-10-CM

## 2014-11-23 DIAGNOSIS — Z823 Family history of stroke: Secondary | ICD-10-CM

## 2014-11-23 DIAGNOSIS — L8990 Pressure ulcer of unspecified site, unspecified stage: Secondary | ICD-10-CM | POA: Insufficient documentation

## 2014-11-23 DIAGNOSIS — R748 Abnormal levels of other serum enzymes: Secondary | ICD-10-CM | POA: Diagnosis present

## 2014-11-23 DIAGNOSIS — R1312 Dysphagia, oropharyngeal phase: Secondary | ICD-10-CM | POA: Diagnosis not present

## 2014-11-23 DIAGNOSIS — R911 Solitary pulmonary nodule: Secondary | ICD-10-CM | POA: Diagnosis not present

## 2014-11-23 DIAGNOSIS — M6281 Muscle weakness (generalized): Secondary | ICD-10-CM | POA: Diagnosis not present

## 2014-11-23 DIAGNOSIS — N189 Chronic kidney disease, unspecified: Secondary | ICD-10-CM | POA: Diagnosis present

## 2014-11-23 DIAGNOSIS — Z9181 History of falling: Secondary | ICD-10-CM | POA: Diagnosis not present

## 2014-11-23 DIAGNOSIS — R296 Repeated falls: Secondary | ICD-10-CM | POA: Diagnosis not present

## 2014-11-23 DIAGNOSIS — M25551 Pain in right hip: Secondary | ICD-10-CM | POA: Diagnosis not present

## 2014-11-23 DIAGNOSIS — R490 Dysphonia: Secondary | ICD-10-CM | POA: Diagnosis not present

## 2014-11-23 DIAGNOSIS — E119 Type 2 diabetes mellitus without complications: Secondary | ICD-10-CM | POA: Diagnosis not present

## 2014-11-23 HISTORY — DX: Gastro-esophageal reflux disease without esophagitis: K21.9

## 2014-11-23 HISTORY — DX: Type 2 diabetes mellitus without complications: E11.9

## 2014-11-23 HISTORY — DX: Polyneuropathy, unspecified: G62.9

## 2014-11-23 HISTORY — DX: Polyp of colon: K63.5

## 2014-11-23 HISTORY — DX: Alzheimer's disease, unspecified: G30.9

## 2014-11-23 HISTORY — DX: Dementia in other diseases classified elsewhere, unspecified severity, without behavioral disturbance, psychotic disturbance, mood disturbance, and anxiety: F02.80

## 2014-11-23 HISTORY — DX: Fibromyalgia: M79.7

## 2014-11-23 HISTORY — DX: Essential (primary) hypertension: I10

## 2014-11-23 LAB — CBC WITH DIFFERENTIAL/PLATELET
BASOS ABS: 0 10*3/uL (ref 0.0–0.1)
BASOS PCT: 0 %
Eosinophils Absolute: 0.1 10*3/uL (ref 0.0–0.7)
Eosinophils Relative: 0 %
HEMATOCRIT: 44 % (ref 36.0–46.0)
HEMOGLOBIN: 14.6 g/dL (ref 12.0–15.0)
Lymphocytes Relative: 19 %
Lymphs Abs: 2.2 10*3/uL (ref 0.7–4.0)
MCH: 30.5 pg (ref 26.0–34.0)
MCHC: 33.2 g/dL (ref 30.0–36.0)
MCV: 92.1 fL (ref 78.0–100.0)
MONO ABS: 1.1 10*3/uL — AB (ref 0.1–1.0)
Monocytes Relative: 10 %
NEUTROS ABS: 8 10*3/uL — AB (ref 1.7–7.7)
NEUTROS PCT: 71 %
Platelets: 234 10*3/uL (ref 150–400)
RBC: 4.78 MIL/uL (ref 3.87–5.11)
RDW: 13.3 % (ref 11.5–15.5)
WBC: 11.4 10*3/uL — ABNORMAL HIGH (ref 4.0–10.5)

## 2014-11-23 LAB — COMPREHENSIVE METABOLIC PANEL
ALBUMIN: 4 g/dL (ref 3.5–5.0)
ALT: 20 U/L (ref 14–54)
AST: 35 U/L (ref 15–41)
Alkaline Phosphatase: 82 U/L (ref 38–126)
Anion gap: 12 (ref 5–15)
BILIRUBIN TOTAL: 1.1 mg/dL (ref 0.3–1.2)
BUN: 66 mg/dL — AB (ref 6–20)
CHLORIDE: 107 mmol/L (ref 101–111)
CO2: 24 mmol/L (ref 22–32)
Calcium: 10 mg/dL (ref 8.9–10.3)
Creatinine, Ser: 2.49 mg/dL — ABNORMAL HIGH (ref 0.44–1.00)
GFR calc Af Amer: 19 mL/min — ABNORMAL LOW (ref >60)
GFR calc non Af Amer: 17 mL/min — ABNORMAL LOW (ref >60)
GLUCOSE: 103 mg/dL — AB (ref 65–99)
POTASSIUM: 4.1 mmol/L (ref 3.5–5.1)
SODIUM: 143 mmol/L (ref 135–145)
TOTAL PROTEIN: 7.3 g/dL (ref 6.5–8.1)

## 2014-11-23 LAB — I-STAT TROPONIN, ED: Troponin i, poc: 0.42 ng/mL (ref 0.00–0.08)

## 2014-11-23 LAB — I-STAT CG4 LACTIC ACID, ED: LACTIC ACID, VENOUS: 1.17 mmol/L (ref 0.5–2.0)

## 2014-11-23 MED ORDER — SODIUM CHLORIDE 0.9 % IV SOLN
Freq: Once | INTRAVENOUS | Status: AC
  Administered 2014-11-24: 01:00:00 via INTRAVENOUS

## 2014-11-23 MED ORDER — ASPIRIN EC 81 MG PO TBEC
81.0000 mg | DELAYED_RELEASE_TABLET | Freq: Once | ORAL | Status: AC
  Administered 2014-11-24: 81 mg via ORAL
  Filled 2014-11-23: qty 1

## 2014-11-23 MED ORDER — SODIUM CHLORIDE 0.9 % IV BOLUS (SEPSIS)
500.0000 mL | Freq: Once | INTRAVENOUS | Status: AC
  Administered 2014-11-23: 500 mL via INTRAVENOUS

## 2014-11-23 NOTE — ED Notes (Signed)
Pt from [facility] with her son. Pt has a hx of renal insufficiency and alzheimer's. Pt is alert and oriented x 4 at assessment. Pt's son states that the pt was hospitalized recently and was moved to [place] approximately 2 weeks ago (for depression and a fall at home). In that time the pt's son reports that the pt is no longer able to walk with her walker and is more lethargic. The pt reports that she is more "tired than normal and has a difficult time swallowing.     Pt was seen at [facility] today where blood was drawn and she was instructed to come to the ED for "problems with her kidneys".  Pt denies pain or discomfort at this time

## 2014-11-23 NOTE — ED Notes (Signed)
Pt sent by PCP for dehydration. Pt c/o increased fatigue and decreased UO. Denies pain, N/V/D, fever/chills. A&O x4.

## 2014-11-23 NOTE — ED Notes (Signed)
Nurse drawing labs. 

## 2014-11-24 ENCOUNTER — Inpatient Hospital Stay (HOSPITAL_COMMUNITY): Payer: Medicare Other

## 2014-11-24 ENCOUNTER — Encounter (HOSPITAL_COMMUNITY): Payer: Self-pay | Admitting: Internal Medicine

## 2014-11-24 DIAGNOSIS — I10 Essential (primary) hypertension: Secondary | ICD-10-CM

## 2014-11-24 DIAGNOSIS — N189 Chronic kidney disease, unspecified: Secondary | ICD-10-CM

## 2014-11-24 DIAGNOSIS — E119 Type 2 diabetes mellitus without complications: Secondary | ICD-10-CM

## 2014-11-24 DIAGNOSIS — F329 Major depressive disorder, single episode, unspecified: Secondary | ICD-10-CM | POA: Diagnosis present

## 2014-11-24 DIAGNOSIS — N179 Acute kidney failure, unspecified: Secondary | ICD-10-CM | POA: Diagnosis present

## 2014-11-24 DIAGNOSIS — L8990 Pressure ulcer of unspecified site, unspecified stage: Secondary | ICD-10-CM | POA: Insufficient documentation

## 2014-11-24 DIAGNOSIS — R7989 Other specified abnormal findings of blood chemistry: Secondary | ICD-10-CM

## 2014-11-24 DIAGNOSIS — R531 Weakness: Secondary | ICD-10-CM

## 2014-11-24 HISTORY — DX: Pressure ulcer of unspecified site, unspecified stage: L89.90

## 2014-11-24 HISTORY — DX: Chronic kidney disease, unspecified: N18.9

## 2014-11-24 HISTORY — DX: Chronic kidney disease, unspecified: N17.9

## 2014-11-24 LAB — CBC WITH DIFFERENTIAL/PLATELET
BASOS PCT: 0 %
Basophils Absolute: 0 10*3/uL (ref 0.0–0.1)
EOS ABS: 0.1 10*3/uL (ref 0.0–0.7)
Eosinophils Relative: 1 %
HCT: 41.4 % (ref 36.0–46.0)
Hemoglobin: 13.8 g/dL (ref 12.0–15.0)
Lymphocytes Relative: 18 %
Lymphs Abs: 1.8 10*3/uL (ref 0.7–4.0)
MCH: 30.7 pg (ref 26.0–34.0)
MCHC: 33.3 g/dL (ref 30.0–36.0)
MCV: 92 fL (ref 78.0–100.0)
MONO ABS: 0.8 10*3/uL (ref 0.1–1.0)
MONOS PCT: 8 %
NEUTROS PCT: 73 %
Neutro Abs: 7.3 10*3/uL (ref 1.7–7.7)
Platelets: 189 10*3/uL (ref 150–400)
RBC: 4.5 MIL/uL (ref 3.87–5.11)
RDW: 13.3 % (ref 11.5–15.5)
WBC: 10 10*3/uL (ref 4.0–10.5)

## 2014-11-24 LAB — GLUCOSE, CAPILLARY
GLUCOSE-CAPILLARY: 146 mg/dL — AB (ref 65–99)
Glucose-Capillary: 45 mg/dL — ABNORMAL LOW (ref 65–99)
Glucose-Capillary: 54 mg/dL — ABNORMAL LOW (ref 65–99)
Glucose-Capillary: 81 mg/dL (ref 65–99)
Glucose-Capillary: 72 mg/dL (ref 65–99)

## 2014-11-24 LAB — I-STAT CG4 LACTIC ACID, ED: LACTIC ACID, VENOUS: 0.81 mmol/L (ref 0.5–2.0)

## 2014-11-24 LAB — URINALYSIS, ROUTINE W REFLEX MICROSCOPIC
GLUCOSE, UA: NEGATIVE mg/dL
Ketones, ur: NEGATIVE mg/dL
Nitrite: POSITIVE — AB
PH: 5 (ref 5.0–8.0)
Protein, ur: NEGATIVE mg/dL
Specific Gravity, Urine: 1.02 (ref 1.005–1.030)
Urobilinogen, UA: 1 mg/dL (ref 0.0–1.0)

## 2014-11-24 LAB — SODIUM, URINE, RANDOM: Sodium, Ur: 65 mmol/L

## 2014-11-24 LAB — TROPONIN I
TROPONIN I: 0.27 ng/mL — AB (ref <0.031)
TROPONIN I: 0.24 ng/mL — AB (ref <0.031)

## 2014-11-24 LAB — COMPREHENSIVE METABOLIC PANEL
ALK PHOS: 71 U/L (ref 38–126)
ALT: 20 U/L (ref 14–54)
AST: 30 U/L (ref 15–41)
Albumin: 3.5 g/dL (ref 3.5–5.0)
Anion gap: 12 (ref 5–15)
BILIRUBIN TOTAL: 1 mg/dL (ref 0.3–1.2)
BUN: 63 mg/dL — ABNORMAL HIGH (ref 6–20)
CALCIUM: 9.4 mg/dL (ref 8.9–10.3)
CO2: 20 mmol/L — AB (ref 22–32)
CREATININE: 2.08 mg/dL — AB (ref 0.44–1.00)
Chloride: 110 mmol/L (ref 101–111)
GFR calc non Af Amer: 21 mL/min — ABNORMAL LOW (ref >60)
GFR, EST AFRICAN AMERICAN: 24 mL/min — AB (ref >60)
GLUCOSE: 86 mg/dL (ref 65–99)
Potassium: 3.1 mmol/L — ABNORMAL LOW (ref 3.5–5.1)
SODIUM: 142 mmol/L (ref 135–145)
TOTAL PROTEIN: 6.5 g/dL (ref 6.5–8.1)

## 2014-11-24 LAB — URINE MICROSCOPIC-ADD ON

## 2014-11-24 LAB — CREATININE, URINE, RANDOM: Creatinine, Urine: 172.72 mg/dL

## 2014-11-24 LAB — CBG MONITORING, ED: GLUCOSE-CAPILLARY: 89 mg/dL (ref 65–99)

## 2014-11-24 MED ORDER — ESCITALOPRAM OXALATE 10 MG PO TABS
5.0000 mg | ORAL_TABLET | Freq: Every day | ORAL | Status: DC
  Administered 2014-11-24 – 2014-11-27 (×4): 5 mg via ORAL
  Filled 2014-11-24 (×4): qty 1

## 2014-11-24 MED ORDER — PANTOPRAZOLE SODIUM 20 MG PO TBEC
20.0000 mg | DELAYED_RELEASE_TABLET | Freq: Every day | ORAL | Status: DC
  Administered 2014-11-24 – 2014-11-27 (×4): 20 mg via ORAL
  Filled 2014-11-24 (×4): qty 1

## 2014-11-24 MED ORDER — DEXTROSE 5 % IV SOLN
1.0000 g | INTRAVENOUS | Status: DC
  Administered 2014-11-25 – 2014-11-27 (×3): 1 g via INTRAVENOUS
  Filled 2014-11-24 (×3): qty 10

## 2014-11-24 MED ORDER — ASPIRIN EC 325 MG PO TBEC
325.0000 mg | DELAYED_RELEASE_TABLET | Freq: Every day | ORAL | Status: DC
  Administered 2014-11-25 – 2014-11-27 (×3): 325 mg via ORAL
  Filled 2014-11-24 (×3): qty 1

## 2014-11-24 MED ORDER — ENOXAPARIN SODIUM 30 MG/0.3ML ~~LOC~~ SOLN
30.0000 mg | SUBCUTANEOUS | Status: DC
  Administered 2014-11-25 – 2014-11-26 (×2): 30 mg via SUBCUTANEOUS
  Filled 2014-11-24 (×2): qty 0.3

## 2014-11-24 MED ORDER — GLIMEPIRIDE 2 MG PO TABS
2.0000 mg | ORAL_TABLET | Freq: Every day | ORAL | Status: DC
  Administered 2014-11-24 – 2014-11-25 (×2): 2 mg via ORAL
  Filled 2014-11-24 (×2): qty 1

## 2014-11-24 MED ORDER — MELATONIN 1 MG PO TABS: 1.0000 mg | ORAL_TABLET | Freq: Every day | ORAL | Status: DC

## 2014-11-24 MED ORDER — ACETAMINOPHEN 325 MG PO TABS
650.0000 mg | ORAL_TABLET | Freq: Four times a day (QID) | ORAL | Status: DC | PRN
  Administered 2014-11-26: 650 mg via ORAL
  Filled 2014-11-24: qty 2

## 2014-11-24 MED ORDER — SODIUM CHLORIDE 0.9 % IV SOLN
INTRAVENOUS | Status: AC
  Administered 2014-11-24 (×2): via INTRAVENOUS

## 2014-11-24 MED ORDER — LABETALOL HCL 5 MG/ML IV SOLN
5.0000 mg | INTRAVENOUS | Status: DC | PRN
  Administered 2014-11-27: 5 mg via INTRAVENOUS
  Filled 2014-11-24: qty 4

## 2014-11-24 MED ORDER — ENOXAPARIN SODIUM 40 MG/0.4ML ~~LOC~~ SOLN
40.0000 mg | SUBCUTANEOUS | Status: DC
  Administered 2014-11-24: 40 mg via SUBCUTANEOUS
  Filled 2014-11-24: qty 0.4

## 2014-11-24 MED ORDER — LOXAPINE SUCCINATE 25 MG PO CAPS
25.0000 mg | ORAL_CAPSULE | Freq: Every day | ORAL | Status: DC
  Administered 2014-11-24 – 2014-11-26 (×3): 25 mg via ORAL
  Filled 2014-11-24 (×5): qty 1

## 2014-11-24 MED ORDER — METOPROLOL SUCCINATE ER 25 MG PO TB24
25.0000 mg | ORAL_TABLET | Freq: Every day | ORAL | Status: DC
  Administered 2014-11-24 – 2014-11-27 (×4): 25 mg via ORAL
  Filled 2014-11-24 (×4): qty 1

## 2014-11-24 MED ORDER — ADULT MULTIVITAMIN W/MINERALS CH
1.0000 | ORAL_TABLET | Freq: Every day | ORAL | Status: DC
  Administered 2014-11-24 – 2014-11-27 (×4): 1 via ORAL
  Filled 2014-11-24 (×4): qty 1

## 2014-11-24 MED ORDER — DEXTROSE 5 % IV SOLN
1.0000 g | Freq: Once | INTRAVENOUS | Status: AC
  Administered 2014-11-24: 1 g via INTRAVENOUS
  Filled 2014-11-24: qty 10

## 2014-11-24 MED ORDER — POTASSIUM CHLORIDE CRYS ER 20 MEQ PO TBCR
40.0000 meq | EXTENDED_RELEASE_TABLET | Freq: Once | ORAL | Status: AC
  Administered 2014-11-24: 40 meq via ORAL
  Filled 2014-11-24: qty 2

## 2014-11-24 MED ORDER — VITAMIN B-12 1000 MCG PO TABS
1000.0000 ug | ORAL_TABLET | Freq: Every day | ORAL | Status: DC
  Administered 2014-11-24 – 2014-11-27 (×4): 1000 ug via ORAL
  Filled 2014-11-24 (×4): qty 1

## 2014-11-24 MED ORDER — FLUTICASONE PROPIONATE 50 MCG/ACT NA SUSP
2.0000 | Freq: Every day | NASAL | Status: DC
  Administered 2014-11-24 – 2014-11-27 (×4): 2 via NASAL
  Filled 2014-11-24 (×2): qty 16

## 2014-11-24 MED ORDER — ACETAMINOPHEN 650 MG RE SUPP: 650.0000 mg | Freq: Four times a day (QID) | RECTAL | Status: DC | PRN

## 2014-11-24 MED ORDER — ONDANSETRON HCL 4 MG/2ML IJ SOLN: 4.0000 mg | Freq: Four times a day (QID) | INTRAMUSCULAR | Status: DC | PRN

## 2014-11-24 MED ORDER — INSULIN ASPART 100 UNIT/ML ~~LOC~~ SOLN
0.0000 [IU] | Freq: Three times a day (TID) | SUBCUTANEOUS | Status: DC
  Administered 2014-11-24 – 2014-11-25 (×2): 1 [IU] via SUBCUTANEOUS

## 2014-11-24 MED ORDER — ONDANSETRON HCL 4 MG PO TABS: 4.0000 mg | ORAL_TABLET | Freq: Four times a day (QID) | ORAL | Status: DC | PRN

## 2014-11-24 NOTE — Consult Note (Signed)
WOC wound consult note Reason for Consult: Stage I Pressure injury to coccyx, present on admission Wound type: Stage I pressure injury, intact nonblanchable skin to coccyx Pressure Ulcer POA: Yes Measurement: 1 cm x 1cm Wound bed:Intact nonblanchable redness Drainage (amount, consistency, odor) none Periwound:intact Dressing procedure/placement/frequency:Cleanse sacrococcygeal area with soap and water.  Pat gently dry.  Apply Allevyn sacral silicone bordered foam dressing.  Change every 3 days and PRN soilage. Will not follow at this time.  Please re-consult if needed.  Domenic Moras RN BSN [place] Pager [phone]

## 2014-11-24 NOTE — Progress Notes (Signed)
  Echocardiogram 2D Echocardiogram has been performed.  Emma Mcguire 11/24/2014, 12:41 PM

## 2014-11-24 NOTE — H&P (Signed)
[facility] History and Physical  Emma Mcguire:[id number] DOB: 02/10/1928 DOA: 11/23/2014  Referring physician: Dr.Nguyen. PCP: Hermine Messick, MD  Specialists: None.  History obtained from patient's son. Patient has history of dementia.  Chief Complaint: Weakness.  HPI: Emma Mcguire is a 81 y.o. female who was recently admitted at [facility] for psychosis last month and has been placed at assisted living facility in [place] recently was found to be increasingly weak over the last few days by patient's family. Patient was taken to her new PCP yesterday and lab work showed worsening creatinine. Patient's creatinine in August 2060 was around 1.1 which can be found in care everywhere. Presently is around 2.4. Point-of-care troponin is mildly positive but EKG is unremarkable. On exam patient denies any chest pain or shortness of breath does complain of mild right groin pain around the right hip. Denies any nausea vomiting diarrhea. As per patient's son patient has not been drinking a lot of fluid over the last few weeks. Patient has been admitted for further management of acute renal failure and mildly elevated point-of-care troponin.   Review of Systems: As presented in the history of presenting illness, rest negative.  Past Medical History  Diagnosis Date  . Hypertension   . Alzheimer disease   . Fibromyalgia   . Abnormal heart rhythm   . Colon polyp   . GERD (gastroesophageal reflux disease)   . Diabetes mellitus without complication   . Neuropathy    Past Surgical History  Procedure Laterality Date  . Shoulder surgery     Social History:  reports that she has never smoked. She does not have any smokeless tobacco history on file. She reports that she does not drink alcohol or use illicit drugs. Where does patient live in assisted living facility. Can patient participate in ADLs? Not sure.  Allergies  Allergen Reactions  . Cymbalta [Duloxetine Hcl]  Other (See Comments)    Listed on MAR  . Lipitor [Atorvastatin] Other (See Comments)    Listed on MAR  . Metformin And Related Other (See Comments)    Listed on MAR  . Simvastatin Other (See Comments)    Listed on MAR  . Verapamil     Sinus arrest    Family History:  Family History  Problem Relation Age of Onset  . Stroke Mother   . Diabetes Mellitus II Maternal Grandmother       Prior to Admission medications   Medication Sig Start Date End Date Taking? Authorizing Provider  aspirin 81 MG chewable tablet Chew 81 mg by mouth daily. 11/09/14 11/09/15 Yes Historical Provider, MD  celecoxib (CELEBREX) 200 MG capsule Take 200 mg by mouth daily. 11/09/14 12/09/14 Yes Historical Provider, MD  escitalopram (LEXAPRO) 5 MG tablet Take 5 mg by mouth daily. 11/09/14 02/07/15 Yes Historical Provider, MD  fluticasone (FLONASE) 50 MCG/ACT nasal spray Place 2 sprays into both nostrils daily.   Yes Historical Provider, MD  glimepiride (AMARYL) 2 MG tablet Take 2 mg by mouth daily. 11/09/14 11/09/15 Yes Historical Provider, MD  hydrochlorothiazide (HYDRODIURIL) 25 MG tablet Take 25 mg by mouth daily. 11/09/14 11/09/15 Yes Historical Provider, MD  losartan (COZAAR) 100 MG tablet Take 100 mg by mouth daily. 11/09/14 11/09/15 Yes Historical Provider, MD  loxapine (LOXITANE) 25 MG capsule Take 25 mg by mouth at bedtime. 11/09/14 11/09/15 Yes Historical Provider, MD  Melatonin 1 MG TABS Take 1 mg by mouth at bedtime. 11/09/14 11/09/15 Yes Historical Provider, MD  metoprolol succinate (TOPROL  XL) 25 MG 24 hr tablet Take 25 mg by mouth daily. 11/20/14 11/20/15 Yes Historical Provider, MD  Multiple Vitamin (THERA) TABS Take 1 tablet by mouth daily. 11/09/14 11/09/15 Yes Historical Provider, MD  pantoprazole (PROTONIX) 20 MG tablet Take 20 mg by mouth daily.   Yes Historical Provider, MD  vitamin B-12 (CYANOCOBALAMIN) 1000 MCG tablet Take 1,000 mcg by mouth daily. 11/09/14 11/09/15 Yes Historical Provider, MD    Physical Exam: Filed Vitals:    11/23/14 2027 11/24/14 0139  BP: 134/66 129/58  Pulse: 93 89  Temp: 97.8 F (36.6 C)   TempSrc: Oral   Resp: 18 22  SpO2: 95% 95%     General:  Moderately built and nourished.  Eyes: Anicteric no pallor.  ENT: No discharge from the ears eyes nose and mouth.  Neck: No mass felt.  Cardiovascular: S1-S2 heard.  Respiratory: No rhonchi or crepitations.  Abdomen: Soft nontender bowel sounds present.  Skin: No rash.  Musculoskeletal: No edema.  Psychiatric: Patient has dementia.  Neurologic: Alert awake oriented to her name and place. Moves all extremities 5 x 5. No facial asymmetry. Tongue is midline.  Labs on Admission:  Basic Metabolic Panel:  Recent Labs Lab 11/23/14 2249  NA 143  K 4.1  CL 107  CO2 24  GLUCOSE 103*  BUN 66*  CREATININE 2.49*  CALCIUM 10.0   Liver Function Tests:  Recent Labs Lab 11/23/14 2249  AST 35  ALT 20  ALKPHOS 82  BILITOT 1.1  PROT 7.3  ALBUMIN 4.0   No results for input(s): LIPASE, AMYLASE in the last 168 hours. No results for input(s): AMMONIA in the last 168 hours. CBC:  Recent Labs Lab 11/23/14 2249  WBC 11.4*  NEUTROABS 8.0*  HGB 14.6  HCT 44.0  MCV 92.1  PLT 234   Cardiac Enzymes: No results for input(s): CKTOTAL, CKMB, CKMBINDEX, TROPONINI in the last 168 hours.  BNP (last 3 results) No results for input(s): BNP in the last 8760 hours.  ProBNP (last 3 results) No results for input(s): PROBNP in the last 8760 hours.  CBG: No results for input(s): GLUCAP in the last 168 hours.  Radiological Exams on Admission: Dg Chest 1 View  11/24/2014   CLINICAL DATA:  81 year old female with weakness  EXAM: CHEST  1 VIEW  COMPARISON:  None.  FINDINGS: Single-view of the chest demonstrate clear lungs. No pleural effusion or pneumothorax. A 6 mm nodular density in the right lower lung field superimposed over the right anterior fourth rib may represent a vessel on end. A pulmonary nodule is not excluded. Direct  comparison with prior imaging studies, if available, for follow-up with CT recommended. The cardiac silhouette is within normal limits. There is degenerative changes of the spine and shoulders. No acute fracture.  IMPRESSION: No acute cardiopulmonary process.  A vessel on end or a 6 mm right mid lung field pulmonary nodule. Follow-up recommended.   Electronically Signed   By: Anner Crete M.D.   On: 11/24/2014 01:00   Dg Pelvis 1-2 Views  11/24/2014   CLINICAL DATA:  81 year old female with right hip pain  EXAM: PELVIS - 1-2 VIEW  COMPARISON:  None.  FINDINGS: No acute fracture. There is degenerative changes of the visualized lower lumbar spine. The soft tissues are grossly unremarkable.  IMPRESSION: No acute fracture.   Electronically Signed   By: Anner Crete M.D.   On: 11/24/2014 00:57    EKG: Independently reviewed. Normal sinus rhythm.  Assessment/Plan Principal Problem:  Renal failure (ARF), acute on chronic Active Problems:   Elevated troponin   Hypertension   Diabetes mellitus type 2, controlled   MDD (major depressive disorder)   1. Acute on chronic renal failure - suspect most likely secondary to poor oral intake. Check FENa. Urine studies are pending. Continue with gentle hydration and I'm holding off patient's ARB and HCTZ. Closely follow intake output and metabolic panel. 2. Elevated troponin point of care - check regular troponins and 2-D echo. Patient is on aspirin. Patient denies any chest pain. 3. Hypertension - presently holding off patient's ARB and HCTZ due to renal failure. Patient has been placed on when necessary IV labetalol for systolic blood pressure more than 160. 4. History of HOCM and aortic stenosis - check 2-D echo. 5. Diabetes mellitus type 2 - continue home medications. 6. History of depression and psychosis - continue home medications. 7. Dementia - presently no acute issues.  I have reviewed patient's old charts labs in the care of  everywhere. Personally reviewed patient's x-rays and EKG.   DVT Prophylaxis Lovenox.  Code Status: DO NOT RESUSCITATE.  Family Communication: Discussed with patient's son.  Disposition Plan: Admit to inpatient.    KAKRAKANDY,ARSHAD N. [facility] Pager [phone].  If 7PM-7AM, please contact night-coverage [email or web address] Password [email or web address] 11/24/2014, 1:52 AM

## 2014-11-24 NOTE — Progress Notes (Addendum)
PATIENT DETAILS Name: Emma Mcguire Age: 81 y.o. Sex: female Date of Birth: [birth year] Admit Date: 11/23/2014 Admitting Physician Rise Patience, MD NTZ:GYFVCBS,WHQPR, MD  Brief Narrative: Emma Mcguire is a 81 y.o. female who was recently admitted at [facility] for psychosis last month and has been placed at assisted living facility in [place] recently was found to have generalized weakness. She was found to have ARF, and admitted for further evaluation and treatment.   Subjective: Awake/alert-no chest pain or SOB. Denies recent vomiting or diarrhea.   Assessment/Plan: Principal Problem: Acute Renal Failure:likely pre-renal azotemia from ARB/HCTZ use and poor oral intake. Continue to hold culprit meds, gently hydrate and follow lytes. If no improvement in renal function-then would pursue further work up.  Active Problems: Elevated troponin:doubt ACS-troponin pattern not consistent with ACS. Suspect false positive elevation in a setting of ARF. Importantly no Chest pain or SOB. Await Echo to assess EF/wall motion-if echo unremarkable-doubt further work up needed.  UTI:UA consistent with UTI-poor historian-presents with gen weakness. Restart Rocephin and follow cultures  Hypokalemia:replete and recheck  History of HOCM and aortic stenosis:await 2-D echo   Hypertension:BP currently reasonably well controlled with Metoprolol.Continue to hold Losartan/HCTZ. Follow and adjust.  Diabetes mellitus type 2, controlled:CBG's stable, continue SSI, Amaryl. Follow  Depression/Psychoses:stable-continue Loxapine, Lexapro  Stage 1 pressure ulcer:present on admission. Supportive care  Disposition: Remain inpatient  Antimicrobial agents  See below  Anti-infectives    Start     Dose/Rate Route Frequency Ordered Stop   11/24/14 0315  cefTRIAXone (ROCEPHIN) 1 g in dextrose 5 % 50 mL IVPB     1 g 100 mL/hr over 30 Minutes Intravenous  Once 11/24/14  0301 11/24/14 0406      DVT Prophylaxis: Prophylactic Lovenox  Code Status: DNR per H&P  Family Communication None at bedside  Procedures: None  CONSULTS:  None  Time spent 30 minutes-Greater than 50% of this time was spent in counseling, explanation of diagnosis, planning of further management, and coordination of care.  MEDICATIONS: Scheduled Meds: . aspirin EC  325 mg Oral Daily  . enoxaparin (LOVENOX) injection  40 mg Subcutaneous Q24H  . escitalopram  5 mg Oral Daily  . fluticasone  2 spray Each Nare Daily  . glimepiride  2 mg Oral Daily  . insulin aspart  0-9 Units Subcutaneous TID WC  . loxapine  25 mg Oral QHS  . metoprolol succinate  25 mg Oral Daily  . multivitamin with minerals  1 tablet Oral Daily  . pantoprazole  20 mg Oral Daily  . vitamin B-12  1,000 mcg Oral Daily   Continuous Infusions: . sodium chloride 75 mL/hr at 11/24/14 0336   PRN Meds:.acetaminophen **OR** acetaminophen, labetalol, ondansetron **OR** ondansetron (ZOFRAN) IV    PHYSICAL EXAM: Vital signs in last 24 hours: Filed Vitals:   11/24/14 0930 11/24/14 0954 11/24/14 1150 11/24/14 1200  BP: 164/67 164/67 137/62   Pulse: 99 101 92   Temp:   98.5 F (36.9 C)   TempSrc:   Temporal   Resp: 22 24 14    Height:    5\' 9"  (1.753 m)  Weight:    81.784 kg (180 lb 4.8 oz)  SpO2: 95% 95% 94%     Weight change:  Filed Weights   11/24/14 1200  Weight: 81.784 kg (180 lb 4.8 oz)   Body mass index is 26.61 kg/(m^2).   Gen Exam:  Awake and alert with clear speech.  Neck: Supple, No JVD.   Chest: B/L Clear.   CVS: S1 S2 Regular Abdomen: soft, BS +, non tender, non distended.  Extremities: no edema, lower extremities warm to touch. Neurologic: Non Focal.   Skin: No Rash.   Wounds: N/A.   Intake/Output from previous day: No intake or output data in the 24 hours ending 11/24/14 1227   LAB RESULTS: CBC  Recent Labs Lab 11/23/14 2249 11/24/14 0849  WBC 11.4* 10.0  HGB 14.6 13.8   HCT 44.0 41.4  PLT 234 189  MCV 92.1 92.0  MCH 30.5 30.7  MCHC 33.2 33.3  RDW 13.3 13.3  LYMPHSABS 2.2 1.8  MONOABS 1.1* 0.8  EOSABS 0.1 0.1  BASOSABS 0.0 0.0    Chemistries   Recent Labs Lab 11/23/14 2249 11/24/14 0849  NA 143 142  K 4.1 3.1*  CL 107 110  CO2 24 20*  GLUCOSE 103* 86  BUN 66* 63*  CREATININE 2.49* 2.08*  CALCIUM 10.0 9.4    CBG:  Recent Labs Lab 11/24/14 0839 11/24/14 1157  GLUCAP 89 146*    GFR Estimated Creatinine Clearance: 22.6 mL/min (by C-G formula based on Cr of 2.08).  Coagulation profile No results for input(s): INR, PROTIME in the last 168 hours.  Cardiac Enzymes  Recent Labs Lab 11/24/14 0849  TROPONINI 0.27*    Invalid input(s): POCBNP No results for input(s): DDIMER in the last 72 hours. No results for input(s): HGBA1C in the last 72 hours. No results for input(s): CHOL, HDL, LDLCALC, TRIG, CHOLHDL, LDLDIRECT in the last 72 hours. No results for input(s): TSH, T4TOTAL, T3FREE, THYROIDAB in the last 72 hours.  Invalid input(s): FREET3 No results for input(s): VITAMINB12, FOLATE, FERRITIN, TIBC, IRON, RETICCTPCT in the last 72 hours. No results for input(s): LIPASE, AMYLASE in the last 72 hours.  Urine Studies No results for input(s): UHGB, CRYS in the last 72 hours.  Invalid input(s): UACOL, UAPR, USPG, UPH, UTP, UGL, UKET, UBIL, UNIT, UROB, ULEU, UEPI, UWBC, URBC, UBAC, CAST, UCOM, BILUA  MICROBIOLOGY: No results found for this or any previous visit (from the past 240 hour(s)).  RADIOLOGY STUDIES/RESULTS: Dg Chest 1 View  11/24/2014   CLINICAL DATA:  81 year old female with weakness  EXAM: CHEST  1 VIEW  COMPARISON:  None.  FINDINGS: Single-view of the chest demonstrate clear lungs. No pleural effusion or pneumothorax. A 6 mm nodular density in the right lower lung field superimposed over the right anterior fourth rib may represent a vessel on end. A pulmonary nodule is not excluded. Direct comparison with prior  imaging studies, if available, for follow-up with CT recommended. The cardiac silhouette is within normal limits. There is degenerative changes of the spine and shoulders. No acute fracture.  IMPRESSION: No acute cardiopulmonary process.  A vessel on end or a 6 mm right mid lung field pulmonary nodule. Follow-up recommended.   Electronically Signed   By: Anner Crete M.D.   On: 11/24/2014 01:00   Dg Pelvis 1-2 Views  11/24/2014   CLINICAL DATA:  81 year old female with right hip pain  EXAM: PELVIS - 1-2 VIEW  COMPARISON:  None.  FINDINGS: No acute fracture. There is degenerative changes of the visualized lower lumbar spine. The soft tissues are grossly unremarkable.  IMPRESSION: No acute fracture.   Electronically Signed   By: Anner Crete M.D.   On: 11/24/2014 00:57    Oren Binet, MD  [facility] Pager:[phone]  If 7PM-7AM, please contact  night-coverage [email or web address] Password [email or web address] 11/24/2014, 12:27 PM   LOS: 1 day

## 2014-11-24 NOTE — ED Notes (Signed)
Report received from previous RN.

## 2014-11-24 NOTE — Progress Notes (Signed)
Blood sugar was 45 at 1657, nurse was not made aware.  Retaken at 1731.  Applejuice given, MD made aware.  Sugar increased to 81.

## 2014-11-24 NOTE — ED Notes (Addendum)
Pt can go to floor at 11:20

## 2014-11-24 NOTE — ED Notes (Signed)
Pt back from x-ray.

## 2014-11-24 NOTE — Clinical Social Work Note (Signed)
Clinical Social Work Assessment  Patient Details  Name: Emma Mcguire MRN: [id number] Date of Birth: 31-Aug-1927  Date of referral:  11/24/14               Reason for consult:  Facility Placement                Permission sought to share information with:  Facility Sport and exercise psychologist, Family Supports Permission granted to share information::  Yes, Verbal Permission Granted  Name::     Janyce Ellinger  Agency::  [facility]  Relationship::  son  Contact Information:  [phone]  Housing/Transportation Living arrangements for the past 2 months:  [place] of Information:  Patient, Spouse Patient Interpreter Needed:  None Criminal Activity/Legal Involvement Pertinent to Current Situation/Hospitalization:    Significant Relationships:  None Lives with:  Facility Resident Do you feel safe going back to the place where you live?    Need for family participation in patient care:  Yes (Comment)  Care giving concerns:  Patient admitted from [facility] assisted living   Social Worker assessment / plan:  CSW met with pt at bedside to complete psychosocial assessment. Patient shared that she is a resident at [facility] assisted living. Pt recently moved tehre. Pt son is a strong support and pt sister. Pt hopes to return to [facility] when medically stable however open to snf placement if needed.   Employment status:  Retired Forensic scientist:  Medicare PT Recommendations:  Not assessed at this time [place] / Referral to community resources:  Other (Comment Required) (none identified at this time)  Patient/Family's Response to care:  Pt thankful for csw support and hopes to return to [facility] once medically stable.   Patient/Family's Understanding of and Emotional Response to Diagnosis, Current Treatment, and Prognosis:  Pt appears to be understanding of current prognosis, increased weakness and possible need for skilled  nursing for short term rehab once medically stable. Pt is however hopeful to turn to alf once medically stable.  Emotional Assessment Appearance:  Appears stated age Attitude/Demeanor/Rapport:   (calm and coopeartive) Affect (typically observed):  Accepting Orientation:  Oriented to Self, Oriented to Place, Oriented to  Time, Oriented to Situation Alcohol / Substance use:  Not Applicable Psych involvement (Current and /or in the community):  No (Comment)  Discharge Needs  Concerns to be addressed:  Discharge Planning Concerns (may need snf upon discharge) Readmission within the last 30 days:  No Current discharge risk:  None Barriers to Discharge:  Other (may need snf upon dischage)   REED, KRISTEN A, LCSW 11/24/2014, 1:30 PM

## 2014-11-24 NOTE — Progress Notes (Signed)
CRITICAL VALUE ALERT  Critical value received:  glucose  Date of notification:  9/16  Time of notification:  1601  Critical value read back:yes  Nurse who received alert:  Trenton Gammon  MD notified (1st page):  Ghimire  Time of first page:  1800  MD notified (2nd page):  Time of second page:  Responding MD:    Time MD responded:

## 2014-11-24 NOTE — ED Provider Notes (Signed)
CSN: [id number]     Arrival date & time 11/23/14  2017 History   First MD Initiated Contact with Patient 11/23/14 2106     Chief Complaint  Patient presents with  . Fatigue     (Consider location/radiation/quality/duration/timing/severity/associated sxs/prior Treatment) HPI Comments: 81 y.o. Female with history of HTN, alzheimer disease, DM present with her son for abnormal labs.  The patient was seen today outpatient for fatigue and general decline.  The patient recently moved into an assisted living facility and since that time has slowly declined and has been less active and complaining of just generally felling unwell.  No nausea, vomiting, diarrhea, abdominal pain, chest pain, shortness of breath.  No fevers or chills.  The patient's son was called tonight and told to bring the patient to the ED because labs they drew at the visit showed there was something wrong with her kidneys and that she was dehydrated.     Past Medical History  Diagnosis Date  . Hypertension   . Alzheimer disease   . Fibromyalgia   . Abnormal heart rhythm   . Colon polyp   . GERD (gastroesophageal reflux disease)   . Diabetes mellitus without complication   . Neuropathy    Past Surgical History  Procedure Laterality Date  . Shoulder surgery     Family History  Problem Relation Age of Onset  . Stroke Mother   . Diabetes Mellitus II Maternal Grandmother    Social History  Substance Use Topics  . Smoking status: Never Smoker   . Smokeless tobacco: None  . Alcohol Use: No   OB History    No data available     Review of Systems  Constitutional: Positive for activity change, appetite change and fatigue. Negative for fever and chills.  HENT: Negative for congestion, postnasal drip and rhinorrhea.   Eyes: Negative for pain, redness and visual disturbance.  Respiratory: Negative for cough, chest tightness and shortness of breath.   Cardiovascular: Negative for chest pain and palpitations.   Gastrointestinal: Negative for nausea, vomiting, abdominal pain, diarrhea and constipation.  Genitourinary: Negative for dysuria, urgency, frequency and hematuria.  Musculoskeletal: Negative for back pain, gait problem and neck pain.  Skin: Negative for rash.  Neurological: Positive for weakness (generalized). Negative for dizziness, light-headedness and headaches.  Hematological: Negative for adenopathy. Does not bruise/bleed easily.      Allergies  Cymbalta; Lipitor; Metformin and related; Simvastatin; and Verapamil  Home Medications   Prior to Admission medications   Medication Sig Start Date End Date Taking? Authorizing Provider  aspirin 81 MG chewable tablet Chew 81 mg by mouth daily. 11/09/14 11/09/15 Yes Historical Provider, MD  celecoxib (CELEBREX) 200 MG capsule Take 200 mg by mouth daily. 11/09/14 12/09/14 Yes Historical Provider, MD  escitalopram (LEXAPRO) 5 MG tablet Take 5 mg by mouth daily. 11/09/14 02/07/15 Yes Historical Provider, MD  fluticasone (FLONASE) 50 MCG/ACT nasal spray Place 2 sprays into both nostrils daily.   Yes Historical Provider, MD  glimepiride (AMARYL) 2 MG tablet Take 2 mg by mouth daily. 11/09/14 11/09/15 Yes Historical Provider, MD  hydrochlorothiazide (HYDRODIURIL) 25 MG tablet Take 25 mg by mouth daily. 11/09/14 11/09/15 Yes Historical Provider, MD  losartan (COZAAR) 100 MG tablet Take 100 mg by mouth daily. 11/09/14 11/09/15 Yes Historical Provider, MD  loxapine (LOXITANE) 25 MG capsule Take 25 mg by mouth at bedtime. 11/09/14 11/09/15 Yes Historical Provider, MD  Melatonin 1 MG TABS Take 1 mg by mouth at bedtime. 11/09/14 11/09/15 Yes Historical  Provider, MD  metoprolol succinate (TOPROL XL) 25 MG 24 hr tablet Take 25 mg by mouth daily. 11/20/14 11/20/15 Yes Historical Provider, MD  Multiple Vitamin (THERA) TABS Take 1 tablet by mouth daily. 11/09/14 11/09/15 Yes Historical Provider, MD  pantoprazole (PROTONIX) 20 MG tablet Take 20 mg by mouth daily.   Yes Historical Provider, MD   vitamin B-12 (CYANOCOBALAMIN) 1000 MCG tablet Take 1,000 mcg by mouth daily. 11/09/14 11/09/15 Yes Historical Provider, MD   BP 129/58 mmHg  Pulse 89  Temp(Src) 97.8 F (36.6 C) (Oral)  Resp 22  SpO2 95% Physical Exam  Constitutional: She appears well-developed and well-nourished. No distress.  HENT:  Head: Normocephalic and atraumatic.  Right Ear: External ear normal.  Left Ear: External ear normal.  Mouth/Throat: Oropharynx is clear and moist. No oropharyngeal exudate.  Eyes: EOM are normal. Pupils are equal, round, and reactive to light.  Neck: Normal range of motion. Neck supple.  Cardiovascular: Normal rate, regular rhythm and intact distal pulses.   Murmur heard. Pulmonary/Chest: Effort normal. No respiratory distress. She has no wheezes. She has no rales.  Abdominal: Soft. She exhibits no distension. There is no tenderness.  Musculoskeletal: She exhibits no edema or tenderness.  Neurological: She is alert. No cranial nerve deficit or sensory deficit. She exhibits normal muscle tone. Coordination normal.  Slowed finger to nose examination without ataxia  Skin: She is not diaphoretic.  Psychiatric: She is slowed.  Vitals reviewed.   ED Course  Procedures (including critical care time) Labs Review Labs Reviewed  CBC WITH DIFFERENTIAL/PLATELET - Abnormal; Notable for the following:    WBC 11.4 (*)    Neutro Abs 8.0 (*)    Monocytes Absolute 1.1 (*)    All other components within normal limits  COMPREHENSIVE METABOLIC PANEL - Abnormal; Notable for the following:    Glucose, Bld 103 (*)    BUN 66 (*)    Creatinine, Ser 2.49 (*)    GFR calc non Af Amer 17 (*)    GFR calc Af Amer 19 (*)    All other components within normal limits  URINALYSIS, ROUTINE W REFLEX MICROSCOPIC (NOT AT [facility]) - Abnormal; Notable for the following:    APPearance CLOUDY (*)    Hgb urine dipstick MODERATE (*)    Bilirubin Urine SMALL (*)    Nitrite POSITIVE (*)    Leukocytes, UA SMALL (*)    All  other components within normal limits  URINE MICROSCOPIC-ADD ON - Abnormal; Notable for the following:    Bacteria, UA MANY (*)    All other components within normal limits  I-STAT TROPOININ, ED - Abnormal; Notable for the following:    Troponin i, poc 0.42 (*)    All other components within normal limits  SODIUM, URINE, RANDOM  CREATININE, URINE, RANDOM  I-STAT CG4 LACTIC ACID, ED  I-STAT CG4 LACTIC ACID, ED    Imaging Review Dg Chest 1 View  11/24/2014   CLINICAL DATA:  81 year old female with weakness  EXAM: CHEST  1 VIEW  COMPARISON:  None.  FINDINGS: Single-view of the chest demonstrate clear lungs. No pleural effusion or pneumothorax. A 6 mm nodular density in the right lower lung field superimposed over the right anterior fourth rib may represent a vessel on end. A pulmonary nodule is not excluded. Direct comparison with prior imaging studies, if available, for follow-up with CT recommended. The cardiac silhouette is within normal limits. There is degenerative changes of the spine and shoulders. No acute fracture.  IMPRESSION:  No acute cardiopulmonary process.  A vessel on end or a 6 mm right mid lung field pulmonary nodule. Follow-up recommended.   Electronically Signed   By: Anner Crete M.D.   On: 11/24/2014 01:00   Dg Pelvis 1-2 Views  11/24/2014   CLINICAL DATA:  81 year old female with right hip pain  EXAM: PELVIS - 1-2 VIEW  COMPARISON:  None.  FINDINGS: No acute fracture. There is degenerative changes of the visualized lower lumbar spine. The soft tissues are grossly unremarkable.  IMPRESSION: No acute fracture.   Electronically Signed   By: Anner Crete M.D.   On: 11/24/2014 00:57   I have personally reviewed and evaluated these images and lab results as part of my medical decision-making.   EKG Interpretation   Date/Time:  Thursday November 23 2014 22:25:06 EDT Ventricular Rate:  88 PR Interval:  154 QRS Duration: 80 QT Interval:  396 QTC Calculation: 479 R  Axis:   29 Text Interpretation:  Sinus rhythm Abnormal R-wave progression, early  transition No previous ECGs available Confirmed by Gianni Fuchs ([id number])  on 11/23/2014 11:17:30 PM      MDM  Patient was seen and evaluated in stable condition.  Laboratory results revealed elevated BUN and Cr with Cr of 2.49 up from a previously normal value.  Leukocytosis also present.  Troponin elevated but EKG not consistent with acute ischemia and component of elevation likely related to renal function - ASA given.  Patient appeared improved after IV fluids.  Case discussed with Dr. Hal Hope who agreed with admission and patient admitted under his care with telemetry.  After initial admission order placed, UA noted to be consistent with infection and Rocephin ordered. Final diagnoses:  Acute renal failure, unspecified acute renal failure type  Elevated troponin    1. Acute renal failure  2. UTI  3. Elevated Troponin    Harvel Quale, MD 11/24/14 [phone]

## 2014-11-24 NOTE — Evaluation (Signed)
Physical Therapy Evaluation Patient Details Name: EVVA DIN MRN: [id number] DOB: 1927-11-19 Today's Date: 11/24/2014   History of Present Illness  81 y.o. female who was admitted from assisted living facility presented with generalized weakness and found to have ARF.  PMHx: Alzheimers, DM, fibromyalgia, neuropathy  Clinical Impression  Pt admitted with above diagnosis. Pt currently with functional limitations due to the deficits listed below (see PT Problem List).  Pt will benefit from skilled PT to increase their independence and safety with mobility to allow discharge to the venue listed below.  RN into room to give K+ pills prior to mobility and pt had choking episode with last 1/2 of pill.  Pt reported feeling scared thereafter which likely limited mobility.  Pt requiring assist for bed mobility and declined standing, transferring to recliner, and ambulating today.  May need SNF upon d/c depending on progress.       Follow Up Recommendations SNF;Supervision/Assistance - 24 hour    Equipment Recommendations  None recommended by PT    Recommendations for Other Services       Precautions / Restrictions Precautions Precautions: Fall      Mobility  Bed Mobility Overal bed mobility: Needs Assistance Bed Mobility: Supine to Sit;Sit to Supine     Supine to sit: Mod assist;HOB elevated Sit to supine: Max assist   General bed mobility comments: required encouragement to mobilize and once assisted to EOB pt felt dizzy and not agreeable to stand, assist back to bed, pt assistance variable (likely due to cognition)  Transfers                    Ambulation/Gait                Stairs            Wheelchair Mobility    Modified Rankin (Stroke Patients Only)       Balance Overall balance assessment: Needs assistance Sitting-balance support: Single extremity supported;Feet supported Sitting balance-Leahy Scale: Fair                                        Pertinent Vitals/Pain Pain Assessment: No/denies pain    Home Living Family/patient expects to be discharged to:: Assisted living               Home Equipment: Walker - 2 wheels      Prior Function Level of Independence: Independent with assistive device(s)         Comments: states she ambulates to dining hall     Hand Dominance        Extremity/Trunk Assessment   Upper Extremity Assessment: Generalized weakness           Lower Extremity Assessment: Generalized weakness         Communication   Communication: No difficulties  Cognition Arousal/Alertness: Awake/alert Behavior During Therapy: Flat affect Overall Cognitive Status: No family/caregiver present to determine baseline cognitive functioning                      General Comments      Exercises        Assessment/Plan    PT Assessment Patient needs continued PT services  PT Diagnosis Difficulty walking;Generalized weakness   PT Problem List Decreased strength;Decreased activity tolerance;Decreased mobility  PT Treatment Interventions DME instruction;Gait training;Functional mobility training;Patient/family education;Therapeutic activities;Therapeutic exercise   PT  Goals (Current goals can be found in the Care Plan section) Acute Rehab PT Goals PT Goal Formulation: With patient Time For Goal Achievement: 12/01/14 Potential to Achieve Goals: Good    Frequency Min 3X/week   Barriers to discharge        Co-evaluation               End of Session   Activity Tolerance: Other (comment) (reported dizziness) Patient left: with call bell/phone within reach;in bed;with bed alarm set Nurse Communication: Mobility status         Time: 1740-8144 PT Time Calculation (min) (ACUTE ONLY): 20 min   Charges:   PT Evaluation $Initial PT Evaluation Tier I: 1 Procedure     PT G Codes:        LEMYRE,KATHrine E 11/24/2014, 4:14 PM Carmelia Bake, PT,  DPT 11/24/2014 Pager: [phone]

## 2014-11-24 NOTE — ED Notes (Signed)
Pt's son can be reached [phone] (home)                                          [phone] (cell)

## 2014-11-25 DIAGNOSIS — N179 Acute kidney failure, unspecified: Principal | ICD-10-CM

## 2014-11-25 DIAGNOSIS — N189 Chronic kidney disease, unspecified: Secondary | ICD-10-CM

## 2014-11-25 LAB — BASIC METABOLIC PANEL
ANION GAP: 10 (ref 5–15)
BUN: 52 mg/dL — AB (ref 6–20)
CO2: 22 mmol/L (ref 22–32)
Calcium: 8.9 mg/dL (ref 8.9–10.3)
Chloride: 109 mmol/L (ref 101–111)
Creatinine, Ser: 1.61 mg/dL — ABNORMAL HIGH (ref 0.44–1.00)
GFR, EST AFRICAN AMERICAN: 33 mL/min — AB (ref >60)
GFR, EST NON AFRICAN AMERICAN: 28 mL/min — AB (ref >60)
Glucose, Bld: 67 mg/dL (ref 65–99)
POTASSIUM: 3.9 mmol/L (ref 3.5–5.1)
SODIUM: 141 mmol/L (ref 135–145)

## 2014-11-25 LAB — GLUCOSE, CAPILLARY
GLUCOSE-CAPILLARY: 66 mg/dL (ref 65–99)
GLUCOSE-CAPILLARY: 102 mg/dL — AB (ref 65–99)
GLUCOSE-CAPILLARY: 126 mg/dL — AB (ref 65–99)
GLUCOSE-CAPILLARY: 150 mg/dL — AB (ref 65–99)
Glucose-Capillary: 56 mg/dL — ABNORMAL LOW (ref 65–99)
Glucose-Capillary: 98 mg/dL (ref 65–99)

## 2014-11-25 MED ORDER — SODIUM CHLORIDE 0.9 % IV SOLN
INTRAVENOUS | Status: DC
  Administered 2014-11-25 – 2014-11-26 (×3): via INTRAVENOUS

## 2014-11-25 NOTE — Progress Notes (Signed)
Hypoglycemic Event  CBG: 66  Treatment: 15 GM carbohydrate snack  Symptoms: None  Follow-up CBG: PRAF:4255 CBG Result:102  Possible Reasons for Event: Inadequate meal intake  Comments/MD notified: hypoglycemic protocol    Illa Level  Remember to initiate Hypoglycemia Order Set & complete

## 2014-11-25 NOTE — Progress Notes (Signed)
PATIENT DETAILS Name: Emma Mcguire Age: 81 y.o. Sex: female Date of Birth: 03-Apr-1927 Admit Date: 11/23/2014 Admitting Physician Rise Patience, MD XKG:YJEHUDJ,SHFWY, MD  Brief Narrative: Emma Mcguire is a 81 y.o. female who was recently admitted at [facility] for psychosis last month and has been placed at assisted living facility in [place] recently was found to have generalized weakness. She was found to have ARF, and admitted for further evaluation and treatment.   Subjective: Appears to be comfortable without any significant shortness of breath chest pain, denies any nausea vomiting   Assessment/Plan: Principal Problem: Acute Renal Failure:likely pre-renal azotemia from ARB/HCTZ use and poor oral intake. Continue to hold culprit meds, continue normal saline at 75, creatinine improving, follow electrolytes.   Active Problems: Elevated troponin:doubt ACS-troponin pattern not consistent with ACS. Suspect false positive elevation in a setting of ARF. Importantly no Chest pain or SOB. 2-D echo shows an EF of 65-70%, LVH, no regional wall motion abnormalities  UTI: Continue Rocephin and follow cultures  Hypokalemia:replete and recheck  History of HOCM and aortic stenosis: Results of 2-D echo as above  Hypertension:BP currently reasonably well controlled with Metoprolol.Continue to hold Losartan/HCTZ.   Diabetes mellitus type 2, controlled hypoglycemic this morning, continue SSI, discontinue Amaryl. Follow  Depression/Psychoses:stable-continue Loxapine, Lexapro  Stage 1 pressure ulcer:present on admission. Supportive care  Disposition: Anticipate discharge to SNF on Monday  Antimicrobial agents  See below  Anti-infectives    Start     Dose/Rate Route Frequency Ordered Stop   11/25/14 0400  cefTRIAXone (ROCEPHIN) 1 g in dextrose 5 % 50 mL IVPB     1 g 100 mL/hr over 30 Minutes Intravenous Every 24 hours 11/24/14 1250     11/24/14 0315  cefTRIAXone (ROCEPHIN) 1 g in dextrose 5 % 50 mL IVPB     1 g 100 mL/hr over 30 Minutes Intravenous  Once 11/24/14 0301 11/24/14 0406      DVT Prophylaxis: Prophylactic Lovenox  Code Status: DNR per H&P  Family Communication None at bedside  Procedures: None  CONSULTS:  None  Time spent 30 minutes-Greater than 50% of this time was spent in counseling, explanation of diagnosis, planning of further management, and coordination of care.  MEDICATIONS: Scheduled Meds: . aspirin EC  325 mg Oral Daily  . cefTRIAXone (ROCEPHIN)  IV  1 g Intravenous Q24H  . enoxaparin (LOVENOX) injection  30 mg Subcutaneous Q24H  . escitalopram  5 mg Oral Daily  . fluticasone  2 spray Each Nare Daily  . glimepiride  2 mg Oral Daily  . insulin aspart  0-9 Units Subcutaneous TID WC  . loxapine  25 mg Oral QHS  . metoprolol succinate  25 mg Oral Daily  . multivitamin with minerals  1 tablet Oral Daily  . pantoprazole  20 mg Oral Daily  . vitamin B-12  1,000 mcg Oral Daily   Continuous Infusions:   PRN Meds:.acetaminophen **OR** acetaminophen, labetalol, ondansetron **OR** ondansetron (ZOFRAN) IV    PHYSICAL EXAM: Vital signs in last 24 hours: Filed Vitals:   11/24/14 2046 11/25/14 0535 11/25/14 1016 11/25/14 1112  BP: 160/80 177/81 156/70 163/62  Pulse: 95 95 92 92  Temp: 97.4 F (36.3 C) 97.8 F (36.6 C) 97.4 F (36.3 C)   TempSrc: Oral Oral Oral   Resp: 16 16 20    Height:      Weight:  81.5 kg (179 lb  10.8 oz)    SpO2: 96% 97% 98%     Weight change:  Filed Weights   11/24/14 1200 11/25/14 0535  Weight: 81.784 kg (180 lb 4.8 oz) 81.5 kg (179 lb 10.8 oz)   Body mass index is 26.52 kg/(m^2).   Gen Exam: Awake and alert with clear speech.  Neck: Supple, No JVD.   Chest: B/L Clear.   CVS: S1 S2 Regular Abdomen: soft, BS +, non tender, non distended.  Extremities: no edema, lower extremities warm to touch. Neurologic: Non Focal.   Skin: No Rash.   Wounds:  N/A.   Intake/Output from previous day:  Intake/Output Summary (Last 24 hours) at 11/25/14 1128 Last data filed at 11/25/14 0755  Gross per 24 hour  Intake 2003.75 ml  Output   1100 ml  Net 903.75 ml     LAB RESULTS: CBC  Recent Labs Lab 11/23/14 2249 11/24/14 0849  WBC 11.4* 10.0  HGB 14.6 13.8  HCT 44.0 41.4  PLT 234 189  MCV 92.1 92.0  MCH 30.5 30.7  MCHC 33.2 33.3  RDW 13.3 13.3  LYMPHSABS 2.2 1.8  MONOABS 1.1* 0.8  EOSABS 0.1 0.1  BASOSABS 0.0 0.0    Chemistries   Recent Labs Lab 11/23/14 2249 11/24/14 0849 11/25/14 0600  NA 143 142 141  K 4.1 3.1* 3.9  CL 107 110 109  CO2 24 20* 22  GLUCOSE 103* 86 67  BUN 66* 63* 52*  CREATININE 2.49* 2.08* 1.61*  CALCIUM 10.0 9.4 8.9    CBG:  Recent Labs Lab 11/24/14 1731 11/24/14 1752 11/24/14 2050 11/25/14 0753 11/25/14 0831  GLUCAP 54* 81 72 66 102*    GFR Estimated Creatinine Clearance: 29.2 mL/min (by C-G formula based on Cr of 1.61).  Coagulation profile No results for input(s): INR, PROTIME in the last 168 hours.  Cardiac Enzymes  Recent Labs Lab 11/24/14 0849 11/24/14 1500  TROPONINI 0.27* 0.24*    Invalid input(s): POCBNP No results for input(s): DDIMER in the last 72 hours. No results for input(s): HGBA1C in the last 72 hours. No results for input(s): CHOL, HDL, LDLCALC, TRIG, CHOLHDL, LDLDIRECT in the last 72 hours. No results for input(s): TSH, T4TOTAL, T3FREE, THYROIDAB in the last 72 hours.  Invalid input(s): FREET3 No results for input(s): VITAMINB12, FOLATE, FERRITIN, TIBC, IRON, RETICCTPCT in the last 72 hours. No results for input(s): LIPASE, AMYLASE in the last 72 hours.  Urine Studies No results for input(s): UHGB, CRYS in the last 72 hours.  Invalid input(s): UACOL, UAPR, USPG, UPH, UTP, UGL, UKET, UBIL, UNIT, UROB, ULEU, UEPI, UWBC, URBC, UBAC, CAST, UCOM, BILUA  MICROBIOLOGY: No results found for this or any previous visit (from the past 240  hour(s)).  RADIOLOGY STUDIES/RESULTS: Dg Chest 1 View  11/24/2014   CLINICAL DATA:  81 year old female with weakness  EXAM: CHEST  1 VIEW  COMPARISON:  None.  FINDINGS: Single-view of the chest demonstrate clear lungs. No pleural effusion or pneumothorax. A 6 mm nodular density in the right lower lung field superimposed over the right anterior fourth rib may represent a vessel on end. A pulmonary nodule is not excluded. Direct comparison with prior imaging studies, if available, for follow-up with CT recommended. The cardiac silhouette is within normal limits. There is degenerative changes of the spine and shoulders. No acute fracture.  IMPRESSION: No acute cardiopulmonary process.  A vessel on end or a 6 mm right mid lung field pulmonary nodule. Follow-up recommended.   Electronically Signed  By: Anner Crete M.D.   On: 11/24/2014 01:00   Dg Pelvis 1-2 Views  11/24/2014   CLINICAL DATA:  81 year old female with right hip pain  EXAM: PELVIS - 1-2 VIEW  COMPARISON:  None.  FINDINGS: No acute fracture. There is degenerative changes of the visualized lower lumbar spine. The soft tissues are grossly unremarkable.  IMPRESSION: No acute fracture.   Electronically Signed   By: Anner Crete M.D.   On: 11/24/2014 00:57    Reyne Dumas, MD  [facility] Pager:[phone]  If 7PM-7AM, please contact night-coverage [email or web address] Password [email or web address] 11/25/2014, 11:28 AM   LOS: 2 days

## 2014-11-25 NOTE — Progress Notes (Signed)
Hypoglycemic Event  CBG: 56  Treatment: 15 GM carbohydrate snack  Symptoms: None  Follow-up CBG: Time:1725 CBG Result:98  Possible Reasons for Event: Inadequate meal intake  Comments/MD notified: hypoglycemic protocol    Illa Level  Remember to initiate Hypoglycemia Order Set & complete

## 2014-11-25 NOTE — Progress Notes (Signed)
Foley left in placed after 3 attempts to I&O cath patient. RN assisted by J. Roosevelt Locks, Therapist, sports and NT. Results called to on-call provider.

## 2014-11-25 NOTE — Progress Notes (Signed)
Bladder scan performed. > 800 cc found in bladder.

## 2014-11-26 LAB — COMPREHENSIVE METABOLIC PANEL
ALK PHOS: 65 U/L (ref 38–126)
ALT: 18 U/L (ref 14–54)
ANION GAP: 8 (ref 5–15)
AST: 23 U/L (ref 15–41)
Albumin: 2.8 g/dL — ABNORMAL LOW (ref 3.5–5.0)
BILIRUBIN TOTAL: 0.8 mg/dL (ref 0.3–1.2)
BUN: 35 mg/dL — ABNORMAL HIGH (ref 6–20)
CALCIUM: 8.8 mg/dL — AB (ref 8.9–10.3)
CO2: 23 mmol/L (ref 22–32)
Chloride: 111 mmol/L (ref 101–111)
Creatinine, Ser: 1.2 mg/dL — ABNORMAL HIGH (ref 0.44–1.00)
GFR, EST AFRICAN AMERICAN: 46 mL/min — AB (ref >60)
GFR, EST NON AFRICAN AMERICAN: 40 mL/min — AB (ref >60)
Glucose, Bld: 109 mg/dL — ABNORMAL HIGH (ref 65–99)
Potassium: 3.4 mmol/L — ABNORMAL LOW (ref 3.5–5.1)
SODIUM: 142 mmol/L (ref 135–145)
TOTAL PROTEIN: 5.6 g/dL — AB (ref 6.5–8.1)

## 2014-11-26 LAB — CBC
HCT: 36.4 % (ref 36.0–46.0)
HEMOGLOBIN: 11.9 g/dL — AB (ref 12.0–15.0)
MCH: 30 pg (ref 26.0–34.0)
MCHC: 32.7 g/dL (ref 30.0–36.0)
MCV: 91.7 fL (ref 78.0–100.0)
PLATELETS: 171 10*3/uL (ref 150–400)
RBC: 3.97 MIL/uL (ref 3.87–5.11)
RDW: 13.2 % (ref 11.5–15.5)
WBC: 11.5 10*3/uL — AB (ref 4.0–10.5)

## 2014-11-26 LAB — GLUCOSE, CAPILLARY
GLUCOSE-CAPILLARY: 108 mg/dL — AB (ref 65–99)
GLUCOSE-CAPILLARY: 127 mg/dL — AB (ref 65–99)
GLUCOSE-CAPILLARY: 141 mg/dL — AB (ref 65–99)
Glucose-Capillary: 101 mg/dL — ABNORMAL HIGH (ref 65–99)

## 2014-11-26 MED ORDER — ENOXAPARIN SODIUM 40 MG/0.4ML ~~LOC~~ SOLN
40.0000 mg | SUBCUTANEOUS | Status: DC
  Administered 2014-11-27: 40 mg via SUBCUTANEOUS
  Filled 2014-11-26: qty 0.4

## 2014-11-26 MED ORDER — POLYETHYLENE GLYCOL 3350 17 G PO PACK
17.0000 g | PACK | Freq: Two times a day (BID) | ORAL | Status: DC
  Administered 2014-11-26: 17 g via ORAL
  Filled 2014-11-26 (×3): qty 1

## 2014-11-26 MED ORDER — TAMSULOSIN HCL 0.4 MG PO CAPS
0.4000 mg | ORAL_CAPSULE | Freq: Every day | ORAL | Status: DC
  Administered 2014-11-26 – 2014-11-27 (×2): 0.4 mg via ORAL
  Filled 2014-11-26 (×2): qty 1

## 2014-11-26 MED ORDER — DOCUSATE SODIUM 100 MG PO CAPS
100.0000 mg | ORAL_CAPSULE | Freq: Every day | ORAL | Status: DC
  Administered 2014-11-26 – 2014-11-27 (×2): 100 mg via ORAL
  Filled 2014-11-26 (×2): qty 1

## 2014-11-26 NOTE — Progress Notes (Signed)
Foley d/c'd at 1000.  Pt unable to void after several attempts.  Bladder scan showed >350cc.  MD notified, will continue to monitor.

## 2014-11-26 NOTE — Progress Notes (Signed)
PATIENT DETAILS Name: Emma Mcguire Age: 81 y.o. Sex: female Date of Birth: [birth year] Admit Date: 11/23/2014 Admitting Physician Rise Patience, MD LNL:[id number],RDEYC, MD  Brief Narrative: Emma Mcguire is a 81 y.o. female who was recently admitted at [facility] for psychosis last month and has been placed at assisted living facility in [place] recently was found to have generalized weakness. She was found to have ARF, and admitted for further evaluation and treatment.   Subjective: Patient's son visiting her, all questions answered, complaining of constipation  Assessment/Plan: Principal Problem: Acute Renal Failure:likely pre-renal azotemia from ARB/HCTZ use and poor oral intake. Continue to hold culprit meds, continue normal saline at 75, creatinine improving, follow electrolytes. DC Foley, DC IV fluids tomorrow   Hypokalemia-replete  Elevated troponin: Peak 0.42, doubt ACS-troponin pattern not consistent with ACS. Suspect false positive elevation in a setting of ARF. Importantly no Chest pain or SOB. 2-D echo shows an EF of 65-70%, LVH, no regional wall motion abnormalities. No further cardiac workup indicated. Discussed with the son  UTI: Continue Rocephin , urine culture shows greater than 100,000 colonies, gram-negative rods, sensitivity pending  History of HOCM and aortic stenosis: Results of 2-D echo as above  Hypertension:BP currently reasonably well controlled with Metoprolol.Continue to hold Losartan/HCTZ.   Diabetes mellitus type 2, controlled hypoglycemic this morning, discontinue SSI given hypoglycemic event this morning, discontinue Amaryl. Follow  Depression/Psychoses:stable-continue Loxapine, Lexapro  Stage 1 pressure ulcer:present on admission. Supportive care  Disposition: Anticipate discharge to SNF on Monday  Antimicrobial agents  See below  Anti-infectives    Start     Dose/Rate Route Frequency Ordered Stop     11/25/14 0400  cefTRIAXone (ROCEPHIN) 1 g in dextrose 5 % 50 mL IVPB     1 g 100 mL/hr over 30 Minutes Intravenous Every 24 hours 11/24/14 1250     11/24/14 0315  cefTRIAXone (ROCEPHIN) 1 g in dextrose 5 % 50 mL IVPB     1 g 100 mL/hr over 30 Minutes Intravenous  Once 11/24/14 0301 11/24/14 0406      DVT Prophylaxis: Prophylactic Lovenox  Code Status: DNR per H&P  Family Communication None at bedside  Procedures: None  CONSULTS:  None  Time spent 30 minutes-Greater than 50% of this time was spent in counseling, explanation of diagnosis, planning of further management, and coordination of care.  MEDICATIONS: Scheduled Meds: . aspirin EC  325 mg Oral Daily  . cefTRIAXone (ROCEPHIN)  IV  1 g Intravenous Q24H  . docusate sodium  100 mg Oral Daily  . enoxaparin (LOVENOX) injection  30 mg Subcutaneous Q24H  . escitalopram  5 mg Oral Daily  . fluticasone  2 spray Each Nare Daily  . loxapine  25 mg Oral QHS  . metoprolol succinate  25 mg Oral Daily  . multivitamin with minerals  1 tablet Oral Daily  . pantoprazole  20 mg Oral Daily  . polyethylene glycol  17 g Oral BID  . vitamin B-12  1,000 mcg Oral Daily   Continuous Infusions: . sodium chloride 75 mL/hr at 11/26/14 0858   PRN Meds:.acetaminophen **OR** acetaminophen, labetalol, ondansetron **OR** ondansetron (ZOFRAN) IV    PHYSICAL EXAM: Vital signs in last 24 hours: Filed Vitals:   11/25/14 1112 11/25/14 1300 11/25/14 2142 11/26/14 0403  BP: 163/62 150/58 150/62 142/55  Pulse: 92 87 90 89  Temp:  98.2 F (36.8 C) 98.2 F (  36.8 C) 98.7 F (37.1 C)  TempSrc:   Oral Oral  Resp:  16 16 16   Height:      Weight:    82.3 kg (181 lb 7 oz)  SpO2:  99% 99% 97%    Weight change: 0.517 kg (1 lb 2.2 oz) Filed Weights   11/24/14 1200 11/25/14 0535 11/26/14 0403  Weight: 81.784 kg (180 lb 4.8 oz) 81.5 kg (179 lb 10.8 oz) 82.3 kg (181 lb 7 oz)   Body mass index is 26.78 kg/(m^2).   Gen Exam: Awake and alert  with clear speech.  Neck: Supple, No JVD.   Chest: B/L Clear.   CVS: S1 S2 Regular Abdomen: soft, BS +, non tender, non distended.  Extremities: no edema, lower extremities warm to touch. Neurologic: Non Focal.   Skin: No Rash.   Wounds: N/A.   Intake/Output from previous day:  Intake/Output Summary (Last 24 hours) at 11/26/14 1134 Last data filed at 11/26/14 0800  Gross per 24 hour  Intake 1993.75 ml  Output    750 ml  Net 1243.75 ml     LAB RESULTS: CBC  Recent Labs Lab 11/23/14 2249 11/24/14 0849 11/26/14 0535  WBC 11.4* 10.0 11.5*  HGB 14.6 13.8 11.9*  HCT 44.0 41.4 36.4  PLT 234 189 171  MCV 92.1 92.0 91.7  MCH 30.5 30.7 30.0  MCHC 33.2 33.3 32.7  RDW 13.3 13.3 13.2  LYMPHSABS 2.2 1.8  --   MONOABS 1.1* 0.8  --   EOSABS 0.1 0.1  --   BASOSABS 0.0 0.0  --     Chemistries   Recent Labs Lab 11/23/14 2249 11/24/14 0849 11/25/14 0600 11/26/14 0535  NA 143 142 141 142  K 4.1 3.1* 3.9 3.4*  CL 107 110 109 111  CO2 24 20* 22 23  GLUCOSE 103* 86 67 109*  BUN 66* 63* 52* 35*  CREATININE 2.49* 2.08* 1.61* 1.20*  CALCIUM 10.0 9.4 8.9 8.8*    CBG:  Recent Labs Lab 11/25/14 1207 11/25/14 1640 11/25/14 1725 11/25/14 2145 11/26/14 0735  GLUCAP 126* 56* 98 150* 108*    GFR Estimated Creatinine Clearance: 39.3 mL/min (by C-G formula based on Cr of 1.2).  Coagulation profile No results for input(s): INR, PROTIME in the last 168 hours.  Cardiac Enzymes  Recent Labs Lab 11/24/14 0849 11/24/14 1500  TROPONINI 0.27* 0.24*    Invalid input(s): POCBNP No results for input(s): DDIMER in the last 72 hours. No results for input(s): HGBA1C in the last 72 hours. No results for input(s): CHOL, HDL, LDLCALC, TRIG, CHOLHDL, LDLDIRECT in the last 72 hours. No results for input(s): TSH, T4TOTAL, T3FREE, THYROIDAB in the last 72 hours.  Invalid input(s): FREET3 No results for input(s): VITAMINB12, FOLATE, FERRITIN, TIBC, IRON, RETICCTPCT in the last 72  hours. No results for input(s): LIPASE, AMYLASE in the last 72 hours.  Urine Studies No results for input(s): UHGB, CRYS in the last 72 hours.  Invalid input(s): UACOL, UAPR, USPG, UPH, UTP, UGL, UKET, UBIL, UNIT, UROB, ULEU, UEPI, UWBC, URBC, UBAC, CAST, UCOM, BILUA  MICROBIOLOGY: Recent Results (from the past 240 hour(s))  Urine culture     Status: None (Preliminary result)   Collection Time: 11/24/14  1:13 PM  Result Value Ref Range Status   Specimen Description URINE, CLEAN CATCH  Final   Special Requests NONE  Final   Culture   Final    >=100,000 COLONIES/mL GRAM NEGATIVE RODS Performed at [facility]  Report Status PENDING  Incomplete    RADIOLOGY STUDIES/RESULTS: Dg Chest 1 View  11/24/2014   CLINICAL DATA:  81 year old female with weakness  EXAM: CHEST  1 VIEW  COMPARISON:  None.  FINDINGS: Single-view of the chest demonstrate clear lungs. No pleural effusion or pneumothorax. A 6 mm nodular density in the right lower lung field superimposed over the right anterior fourth rib may represent a vessel on end. A pulmonary nodule is not excluded. Direct comparison with prior imaging studies, if available, for follow-up with CT recommended. The cardiac silhouette is within normal limits. There is degenerative changes of the spine and shoulders. No acute fracture.  IMPRESSION: No acute cardiopulmonary process.  A vessel on end or a 6 mm right mid lung field pulmonary nodule. Follow-up recommended.   Electronically Signed   By: Anner Crete M.D.   On: 11/24/2014 01:00   Dg Pelvis 1-2 Views  11/24/2014   CLINICAL DATA:  81 year old female with right hip pain  EXAM: PELVIS - 1-2 VIEW  COMPARISON:  None.  FINDINGS: No acute fracture. There is degenerative changes of the visualized lower lumbar spine. The soft tissues are grossly unremarkable.  IMPRESSION: No acute fracture.   Electronically Signed   By: Anner Crete M.D.   On: 11/24/2014 00:57    Reyne Dumas, MD  [facility] Pager:[phone]  If 7PM-7AM, please contact night-coverage [email or web address] Password [email or web address] 11/26/2014, 11:34 AM   LOS: 3 days

## 2014-11-27 DIAGNOSIS — Z79899 Other long term (current) drug therapy: Secondary | ICD-10-CM | POA: Diagnosis not present

## 2014-11-27 DIAGNOSIS — I129 Hypertensive chronic kidney disease with stage 1 through stage 4 chronic kidney disease, or unspecified chronic kidney disease: Secondary | ICD-10-CM | POA: Diagnosis not present

## 2014-11-27 DIAGNOSIS — R41841 Cognitive communication deficit: Secondary | ICD-10-CM | POA: Diagnosis not present

## 2014-11-27 DIAGNOSIS — R404 Transient alteration of awareness: Secondary | ICD-10-CM | POA: Diagnosis not present

## 2014-11-27 DIAGNOSIS — D72829 Elevated white blood cell count, unspecified: Secondary | ICD-10-CM | POA: Diagnosis not present

## 2014-11-27 DIAGNOSIS — I10 Essential (primary) hypertension: Secondary | ICD-10-CM | POA: Diagnosis not present

## 2014-11-27 DIAGNOSIS — Z792 Long term (current) use of antibiotics: Secondary | ICD-10-CM | POA: Diagnosis not present

## 2014-11-27 DIAGNOSIS — R7989 Other specified abnormal findings of blood chemistry: Secondary | ICD-10-CM | POA: Diagnosis not present

## 2014-11-27 DIAGNOSIS — R1312 Dysphagia, oropharyngeal phase: Secondary | ICD-10-CM | POA: Diagnosis not present

## 2014-11-27 DIAGNOSIS — Z7982 Long term (current) use of aspirin: Secondary | ICD-10-CM | POA: Diagnosis not present

## 2014-11-27 DIAGNOSIS — T43595A Adverse effect of other antipsychotics and neuroleptics, initial encounter: Secondary | ICD-10-CM | POA: Diagnosis not present

## 2014-11-27 DIAGNOSIS — R2681 Unsteadiness on feet: Secondary | ICD-10-CM | POA: Diagnosis not present

## 2014-11-27 DIAGNOSIS — R609 Edema, unspecified: Secondary | ICD-10-CM | POA: Diagnosis not present

## 2014-11-27 DIAGNOSIS — F329 Major depressive disorder, single episode, unspecified: Secondary | ICD-10-CM | POA: Diagnosis not present

## 2014-11-27 DIAGNOSIS — F0390 Unspecified dementia without behavioral disturbance: Secondary | ICD-10-CM | POA: Diagnosis not present

## 2014-11-27 DIAGNOSIS — Z9181 History of falling: Secondary | ICD-10-CM | POA: Diagnosis not present

## 2014-11-27 DIAGNOSIS — R627 Adult failure to thrive: Secondary | ICD-10-CM | POA: Diagnosis not present

## 2014-11-27 DIAGNOSIS — N179 Acute kidney failure, unspecified: Secondary | ICD-10-CM | POA: Diagnosis not present

## 2014-11-27 DIAGNOSIS — R4182 Altered mental status, unspecified: Secondary | ICD-10-CM | POA: Diagnosis not present

## 2014-11-27 DIAGNOSIS — F29 Unspecified psychosis not due to a substance or known physiological condition: Secondary | ICD-10-CM | POA: Diagnosis not present

## 2014-11-27 DIAGNOSIS — R531 Weakness: Secondary | ICD-10-CM | POA: Diagnosis not present

## 2014-11-27 DIAGNOSIS — E119 Type 2 diabetes mellitus without complications: Secondary | ICD-10-CM | POA: Diagnosis not present

## 2014-11-27 DIAGNOSIS — R259 Unspecified abnormal involuntary movements: Secondary | ICD-10-CM | POA: Diagnosis not present

## 2014-11-27 DIAGNOSIS — F331 Major depressive disorder, recurrent, moderate: Secondary | ICD-10-CM | POA: Diagnosis not present

## 2014-11-27 DIAGNOSIS — N189 Chronic kidney disease, unspecified: Secondary | ICD-10-CM | POA: Diagnosis not present

## 2014-11-27 DIAGNOSIS — Z87440 Personal history of urinary (tract) infections: Secondary | ICD-10-CM | POA: Diagnosis not present

## 2014-11-27 DIAGNOSIS — N390 Urinary tract infection, site not specified: Secondary | ICD-10-CM | POA: Diagnosis not present

## 2014-11-27 DIAGNOSIS — K219 Gastro-esophageal reflux disease without esophagitis: Secondary | ICD-10-CM | POA: Diagnosis not present

## 2014-11-27 DIAGNOSIS — G309 Alzheimer's disease, unspecified: Secondary | ICD-10-CM | POA: Diagnosis not present

## 2014-11-27 DIAGNOSIS — Z86010 Personal history of colonic polyps: Secondary | ICD-10-CM | POA: Diagnosis not present

## 2014-11-27 DIAGNOSIS — M6281 Muscle weakness (generalized): Secondary | ICD-10-CM | POA: Diagnosis not present

## 2014-11-27 DIAGNOSIS — F0280 Dementia in other diseases classified elsewhere without behavioral disturbance: Secondary | ICD-10-CM | POA: Diagnosis not present

## 2014-11-27 DIAGNOSIS — Z5189 Encounter for other specified aftercare: Secondary | ICD-10-CM | POA: Diagnosis not present

## 2014-11-27 DIAGNOSIS — Z7951 Long term (current) use of inhaled steroids: Secondary | ICD-10-CM | POA: Diagnosis not present

## 2014-11-27 DIAGNOSIS — N3000 Acute cystitis without hematuria: Secondary | ICD-10-CM | POA: Diagnosis not present

## 2014-11-27 LAB — URINE CULTURE: Culture: >=100000

## 2014-11-27 LAB — GLUCOSE, CAPILLARY
GLUCOSE-CAPILLARY: 144 mg/dL — AB (ref 65–99)
GLUCOSE-CAPILLARY: 132 mg/dL — AB (ref 65–99)

## 2014-11-27 MED ORDER — DOCUSATE SODIUM 100 MG PO CAPS: 100.0000 mg | ORAL_CAPSULE | Freq: Every day | ORAL | Status: DC

## 2014-11-27 MED ORDER — CEFDINIR 300 MG PO CAPS: 300.0000 mg | ORAL_CAPSULE | Freq: Two times a day (BID) | ORAL | Status: DC

## 2014-11-27 NOTE — Discharge Summary (Signed)
Physician Discharge Summary  MARCELLA Mcguire MRN: [id number] DOB/AGE: May 15, 1927 81 y.o.  PCP: Emma Messick, MD   Admit date: 11/23/2014 Discharge date: 11/27/2014  Discharge Diagnoses:   Principal Problem:   Renal failure (ARF), acute on chronic Active Problems:   Elevated troponin   Hypertension   Diabetes mellitus type 2, controlled   MDD (major depressive disorder)   Pressure ulcer  UTI   Follow-up recommendations Follow-up with PCP in 3-5 days , including all  additional recommended appointments as below Follow-up CBC, CMP in 3-5 days      Medication List    STOP taking these medications        CELEBREX 200 MG capsule  Generic drug:  celecoxib     hydrochlorothiazide 25 MG tablet  Commonly known as:  HYDRODIURIL     losartan 100 MG tablet  Commonly known as:  COZAAR     Melatonin 1 MG Tabs      TAKE these medications        aspirin 81 MG chewable tablet  Chew 81 mg by mouth daily.     cefdinir 300 MG capsule  Commonly known as:  OMNICEF  Take 1 capsule (300 mg total) by mouth 2 (two) times daily.     docusate sodium 100 MG capsule  Commonly known as:  COLACE  Take 1 capsule (100 mg total) by mouth daily.     escitalopram 5 MG tablet  Commonly known as:  LEXAPRO  Take 5 mg by mouth daily.     fluticasone 50 MCG/ACT nasal spray  Commonly known as:  FLONASE  Place 2 sprays into both nostrils daily.     glimepiride 2 MG tablet  Commonly known as:  AMARYL  Take 2 mg by mouth daily.     loxapine 25 MG capsule  Commonly known as:  LOXITANE  Take 25 mg by mouth at bedtime.     pantoprazole 20 MG tablet  Commonly known as:  PROTONIX  Take 20 mg by mouth daily.     THERA Tabs  Take 1 tablet by mouth daily.     TOPROL XL 25 MG 24 hr tablet  Generic drug:  metoprolol succinate  Take 25 mg by mouth daily.     vitamin B-12 1000 MCG tablet  Commonly known as:  CYANOCOBALAMIN  Take 1,000 mcg by mouth daily.         Discharge Condition:  Stable  Disposition: Final discharge disposition not confirmed   Consults:  None     Significant Diagnostic Studies:  Dg Chest 1 View  11/24/2014   CLINICAL DATA:  81 year old female with weakness  EXAM: CHEST  1 VIEW  COMPARISON:  None.  FINDINGS: Single-view of the chest demonstrate clear lungs. No pleural effusion or pneumothorax. A 6 mm nodular density in the right lower lung field superimposed over the right anterior fourth rib may represent a vessel on end. A pulmonary nodule is not excluded. Direct comparison with prior imaging studies, if available, for follow-up with CT recommended. The cardiac silhouette is within normal limits. There is degenerative changes of the spine and shoulders. No acute fracture.  IMPRESSION: No acute cardiopulmonary process.  A vessel on end or a 6 mm right mid lung field pulmonary nodule. Follow-up recommended.   Electronically Signed   By: Anner Crete M.D.   On: 11/24/2014 01:00   Dg Pelvis 1-2 Views  11/24/2014   CLINICAL DATA:  81 year old female with right hip pain  EXAM: PELVIS -  1-2 VIEW  COMPARISON:  None.  FINDINGS: No acute fracture. There is degenerative changes of the visualized lower lumbar spine. The soft tissues are grossly unremarkable.  IMPRESSION: No acute fracture.   Electronically Signed   By: Anner Crete M.D.   On: 11/24/2014 00:57        Filed Weights   11/24/14 1200 11/25/14 0535 11/26/14 0403  Weight: 81.784 kg (180 lb 4.8 oz) 81.5 kg (179 lb 10.8 oz) 82.3 kg (181 lb 7 oz)     Microbiology: Recent Results (from the past 240 hour(s))  Urine culture     Status: None   Collection Time: 11/24/14  1:13 PM  Result Value Ref Range Status   Specimen Description URINE, CLEAN CATCH  Final   Special Requests NONE  Final   Culture   Final    >=100,000 COLONIES/mL KLEBSIELLA PNEUMONIAE Performed at [facility]    Report Status 11/27/2014 FINAL  Final   Organism ID, Bacteria KLEBSIELLA PNEUMONIAE  Final       Susceptibility   Klebsiella pneumoniae - MIC*    AMPICILLIN >=32 RESISTANT Resistant     CEFAZOLIN <=4 SENSITIVE Sensitive     CEFTRIAXONE <=1 SENSITIVE Sensitive     CIPROFLOXACIN <=0.25 SENSITIVE Sensitive     GENTAMICIN <=1 SENSITIVE Sensitive     IMIPENEM <=0.25 SENSITIVE Sensitive     NITROFURANTOIN 32 SENSITIVE Sensitive     TRIMETH/SULFA <=20 SENSITIVE Sensitive     AMPICILLIN/SULBACTAM 4 SENSITIVE Sensitive     PIP/TAZO <=4 SENSITIVE Sensitive     * >=100,000 COLONIES/mL KLEBSIELLA PNEUMONIAE       Blood Culture    Component Value Date/Time   SDES URINE, CLEAN CATCH 11/24/2014 1313   SPECREQUEST NONE 11/24/2014 1313   CULT  11/24/2014 1313    >=100,000 COLONIES/mL KLEBSIELLA PNEUMONIAE Performed at [place] 11/27/2014 FINAL 11/24/2014 1313      Labs: Results for orders placed or performed during the hospital encounter of 11/23/14 (from the past 48 hour(s))  Glucose, capillary     Status: Abnormal   Collection Time: 11/25/14 12:07 PM  Result Value Ref Range   Glucose-Capillary 126 (H) 65 - 99 mg/dL  Glucose, capillary     Status: Abnormal   Collection Time: 11/25/14  4:40 PM  Result Value Ref Range   Glucose-Capillary 56 (L) 65 - 99 mg/dL  Glucose, capillary     Status: None   Collection Time: 11/25/14  5:25 PM  Result Value Ref Range   Glucose-Capillary 98 65 - 99 mg/dL  Glucose, capillary     Status: Abnormal   Collection Time: 11/25/14  9:45 PM  Result Value Ref Range   Glucose-Capillary 150 (H) 65 - 99 mg/dL   Comment 1 Notify RN   Comprehensive metabolic panel     Status: Abnormal   Collection Time: 11/26/14  5:35 AM  Result Value Ref Range   Sodium 142 135 - 145 mmol/L   Potassium 3.4 (L) 3.5 - 5.1 mmol/L   Chloride 111 101 - 111 mmol/L   CO2 23 22 - 32 mmol/L   Glucose, Bld 109 (H) 65 - 99 mg/dL   BUN 35 (H) 6 - 20 mg/dL   Creatinine, Ser 1.20 (H) 0.44 - 1.00 mg/dL   Calcium 8.8 (L) 8.9 - 10.3 mg/dL   Total Protein  5.6 (L) 6.5 - 8.1 g/dL   Albumin 2.8 (L) 3.5 - 5.0 g/dL   AST 23 15 - 41  U/L   ALT 18 14 - 54 U/L   Alkaline Phosphatase 65 38 - 126 U/L   Total Bilirubin 0.8 0.3 - 1.2 mg/dL   GFR calc non Af Amer 40 (L) >60 mL/min   GFR calc Af Amer 46 (L) >60 mL/min    Comment: (NOTE) The eGFR has been calculated using the CKD EPI equation. This calculation has not been validated in all clinical situations. eGFR's persistently <60 mL/min signify possible Chronic Kidney Disease.    Anion gap 8 5 - 15  CBC     Status: Abnormal   Collection Time: 11/26/14  5:35 AM  Result Value Ref Range   WBC 11.5 (H) 4.0 - 10.5 K/uL    Comment: WHITE COUNT CONFIRMED ON SMEAR   RBC 3.97 3.87 - 5.11 MIL/uL   Hemoglobin 11.9 (L) 12.0 - 15.0 g/dL   HCT 36.4 36.0 - 46.0 %   MCV 91.7 78.0 - 100.0 fL   MCH 30.0 26.0 - 34.0 pg   MCHC 32.7 30.0 - 36.0 g/dL   RDW 13.2 11.5 - 15.5 %   Platelets 171 150 - 400 K/uL  Glucose, capillary     Status: Abnormal   Collection Time: 11/26/14  7:35 AM  Result Value Ref Range   Glucose-Capillary 108 (H) 65 - 99 mg/dL  Glucose, capillary     Status: Abnormal   Collection Time: 11/26/14 12:31 PM  Result Value Ref Range   Glucose-Capillary 141 (H) 65 - 99 mg/dL  Glucose, capillary     Status: Abnormal   Collection Time: 11/26/14  5:18 PM  Result Value Ref Range   Glucose-Capillary 101 (H) 65 - 99 mg/dL  Glucose, capillary     Status: Abnormal   Collection Time: 11/26/14 11:34 PM  Result Value Ref Range   Glucose-Capillary 127 (H) 65 - 99 mg/dL   Comment 1 Notify RN    Comment 2 Document in Chart   Glucose, capillary     Status: Abnormal   Collection Time: 11/27/14  8:08 AM  Result Value Ref Range   Glucose-Capillary 144 (H) 65 - 99 mg/dL     Lipid Panel  No results found for: CHOL, TRIG, HDL, CHOLHDL, VLDL, LDLCALC, LDLDIRECT   No results found for: HGBA1C   Lab Results  Component Value Date   CREATININE 1.20* 11/26/2014    Emma Mcguire is a 81 y.o. female  who was recently admitted at [facility] for psychosis last month and has been placed at assisted living facility in [place] recently was found to have generalized weakness. She was found to have ARF, and admitted for further evaluation and treatment.   Subjective: Patient was able to void after discontinuation of her Foley catheter  Assessment/Plan: Principal Problem: Acute Renal Failure:likely pre-renal azotemia from ARB/HCTZ use and poor oral intake. We held hydrochlorothiazide/HCTZ patient hydrated aggressively with normal saline, creatinine improving, 1.20 prior to discharge, follow electrolytes. Patient was able to void after discontinuation of Foley   Hypokalemia-repleted  Elevated troponin: Peak 0.42, doubt ACS-troponin pattern not consistent with ACS. Suspect false positive elevation in a setting of ARF. Importantly no Chest pain or SOB. 2-D echo shows an EF of 65-70%, LVH, no regional wall motion abnormalities. No further cardiac workup indicated. Discussed with the son  Klebsiella pneumonia UTI: Initially treated with Rocephin , urine culture shows greater than 100,000 colonies, sensitivity as above, patient switched to [facility] for another 7 days  History of HOCM and aortic stenosis: Results of 2-D  echo as above  Hypertension:BP currently reasonably well controlled with Metoprolol.Continue to hold Losartan/HCTZ.   Diabetes mellitus type 2, controlled hypoglycemic this morning, can resume low dose Amaryl if the patient is eating. Her CBG has been borderline. Follow  Depression/Psychoses:stable-continue Loxapine, Lexapro  Stage 1 pressure ulcer:present on admission. Supportive care    Discharge Exam:  Blood pressure 149/68, pulse 94, temperature 97.9 F (36.6 C), temperature source Oral, resp. rate 20, height _0  (1.753 m), weight 82.3 kg (181 lb 7 oz), SpO2 97 %.  Gen Exam: Awake and alert with clear speech.  Neck: Supple, No JVD.  Chest: B/L Clear.   CVS: S1 S2 Regular Abdomen: soft, BS +, non tender, non distended.  Extremities: no edema, lower extremities warm to touch. Neurologic: Non Focal.  Skin: No Rash.  Wounds: N/A.         Discharge Instructions    Diet - low sodium heart healthy    Complete by:  As directed      Increase activity slowly    Complete by:  As directed              Signed: ABROL,NAYANA 11/27/2014, 10:27 AM        Time spent >45 mins

## 2014-11-27 NOTE — Clinical Social Work Placement (Signed)
   CLINICAL SOCIAL WORK PLACEMENT  NOTE  Date:  11/27/2014  Patient Details  Name: Emma Mcguire MRN: [id number] Date of Birth: [birth year]  Clinical Social Work is seeking post-discharge placement for this patient at the [place] level of care (*CSW will initial, date and re-position this form in  chart as items are completed):  Yes   Patient/family provided with [facility] Department's list of facilities offering this level of care within the geographic area requested by the patient (or if unable, by the patient's family).  Yes   Patient/family informed of their freedom to choose among providers that offer the needed level of care, that participate in Medicare, Medicaid or managed care program needed by the patient, have an available bed and are willing to accept the patient.  Yes   Patient/family informed of [place]'s ownership interest in [facility] and [facility], as well as of the fact that they are under no obligation to receive care at these facilities.  PASRR submitted to EDS on 11/27/14     PASRR number received on 11/27/14     Existing PASRR number confirmed on       FL2 transmitted to all facilities in geographic area requested by pt/family on 11/26/14     FL2 transmitted to all facilities within larger geographic area on       Patient informed that his/her managed care company has contracts with or will negotiate with certain facilities, including the following:        Yes   Patient/family informed of bed offers received.  Patient chooses bed at [facility]     Physician recommends and patient chooses bed at      Patient to be transferred to [facility] on 11/27/14.  Patient to be transferred to facility by ambulance [name])     Patient family notified on 11/27/14 of transfer.  Name of family member notified:  pt notified at bedside and pt son, Emma Mcguire notified via telephone      PHYSICIAN Please sign DNR, Please sign FL2     Additional Comment:    _______________________________________________ [name], LCSW 11/27/2014, 1:54 PM

## 2014-11-27 NOTE — Progress Notes (Signed)
Pt for discharge to [facility] and Rehab.  CSW facilitated pt discharge needs including contacting facility, faxing pt discharge information via [facility], discussing with pt at bedside and pt son, Clair Gulling via telephone, providing RN phone number to call report, and arranging ambulance transport for pt to [facility] and Rehab.  Pt coping appropriately with transition to [employer].  No further social work needs identified at this time.  CSW signing off.   Alison Murray, MSW, [place] Work [phone]

## 2014-11-27 NOTE — Progress Notes (Signed)
Physical Therapy Treatment Patient Details Name: Emma Mcguire MRN: [id number] DOB: Nov 27, 1927 Today's Date: 12-21-2014    History of Present Illness 81 y.o. female who was admitted from assisted living facility presented with generalized weakness and found to have ARF.  PMHx: Alzheimers, DM, fibromyalgia, neuropathy    PT Comments    Pt continues to require assist for bed mobility.  Pt encouraged to transfer however pt felt too weak and unable today so returned to supine.  Continue to recommend ST -SNF upon d/c.  Follow Up Recommendations  SNF;Supervision/Assistance - 24 hour     Equipment Recommendations  None recommended by PT    Recommendations for Other Services       Precautions / Restrictions Precautions Precautions: Fall    Mobility  Bed Mobility Overal bed mobility: Needs Assistance;+2 for physical assistance Bed Mobility: Supine to Sit;Sit to Supine;Rolling Rolling: Min assist   Supine to sit: Mod assist Sit to supine: Min assist   General bed mobility comments: pt reported need to use bedpan so encouraged trying up to [facility], assist for guiding LEs over EOB then trunk upright, pt sat EOB approx 5 minutes however felt unable to stand and transfer today, pt assisting more with return to bed, placed bed pan (pt to call out when finished and RN aware)  Transfers                    Ambulation/Gait                 Stairs            Wheelchair Mobility    Modified Rankin (Stroke Patients Only)       Balance                                    Cognition Arousal/Alertness: Awake/alert Behavior During Therapy: WFL for tasks assessed/performed Overall Cognitive Status: No family/caregiver present to determine baseline cognitive functioning                      Exercises      General Comments        Pertinent Vitals/Pain Pain Assessment: No/denies pain    Home Living                      Prior  Function            PT Goals (current goals can now be found in the care plan section) Progress towards PT goals: Progressing toward goals    Frequency  Min 3X/week    PT Plan Current plan remains appropriate    Co-evaluation             End of Session   Activity Tolerance: Patient limited by fatigue Patient left: in bed;with call bell/phone within reach;with bed alarm set     Time: 9485-4627 PT Time Calculation (min) (ACUTE ONLY): 16 min  Charges:  $Therapeutic Activity: 8-22 mins                    G Codes:      Avelynn Sellin,KATHrine E December 21, 2014, 1:20 PM Carmelia Bake, PT, DPT 12/21/2014 Pager: [phone]

## 2014-11-27 NOTE — Progress Notes (Signed)
CSW continuing to follow.   Per weekend CSW, pt and pt son are agreeable to SNF if needed and prefer [facility] and Rehab.   CSW followed up with pt and pt son, Clair Gulling at bedside.  Pt admitted from [facility] and PT recommending SNF.   CSW reviewed SNF bed offers and pt and pt son choose [facility].  CSW contacted [place] and confirmed bed availability for today.  CSW to facilitate pt discharge needs to [facility] today.  Alison Murray, MSW, [place] Work [phone]

## 2014-11-27 NOTE — Care Management Important Message (Signed)
Important Message  Patient Details  Name: Emma Mcguire MRN: [id number] Date of Birth: January 01, 1928   Medicare Important Message Given:  Yes-second notification given    Camillo Flaming 11/27/2014, 11:12 AMImportant Message  Patient Details  Name: Emma Mcguire MRN: [id number] Date of Birth: 10/03/1927   Medicare Important Message Given:  Yes-second notification given    Camillo Flaming 11/27/2014, 11:12 AM

## 2014-11-28 ENCOUNTER — Encounter: Payer: Self-pay | Admitting: Internal Medicine

## 2014-11-28 ENCOUNTER — Non-Acute Institutional Stay (SKILLED_NURSING_FACILITY): Payer: Medicare Other | Admitting: Internal Medicine

## 2014-11-28 DIAGNOSIS — F331 Major depressive disorder, recurrent, moderate: Secondary | ICD-10-CM

## 2014-11-28 DIAGNOSIS — N3000 Acute cystitis without hematuria: Secondary | ICD-10-CM | POA: Diagnosis not present

## 2014-11-28 DIAGNOSIS — I10 Essential (primary) hypertension: Secondary | ICD-10-CM

## 2014-11-28 DIAGNOSIS — R7989 Other specified abnormal findings of blood chemistry: Secondary | ICD-10-CM

## 2014-11-28 DIAGNOSIS — F29 Unspecified psychosis not due to a substance or known physiological condition: Secondary | ICD-10-CM | POA: Diagnosis not present

## 2014-11-28 DIAGNOSIS — E119 Type 2 diabetes mellitus without complications: Secondary | ICD-10-CM

## 2014-11-28 DIAGNOSIS — K219 Gastro-esophageal reflux disease without esophagitis: Secondary | ICD-10-CM | POA: Diagnosis not present

## 2014-11-28 DIAGNOSIS — R778 Other specified abnormalities of plasma proteins: Secondary | ICD-10-CM

## 2014-11-28 DIAGNOSIS — N189 Chronic kidney disease, unspecified: Secondary | ICD-10-CM

## 2014-11-28 DIAGNOSIS — N179 Acute kidney failure, unspecified: Secondary | ICD-10-CM | POA: Diagnosis not present

## 2014-11-28 NOTE — Progress Notes (Signed)
MRN: [id number] Name: Emma Mcguire  Sex: female Age: 81 y.o. DOB: 1927-08-18  [place] #: [facility] Facility/Room:505 Level Of Care: SNF Emma Mcguire: Emma Mcguire D Emergency Contacts: Extended Emergency Contact Information Primary Emergency Contact: Emma Mcguire,Emma Mcguire Address: [address], [place] [address] [place] Phone: [phone] Relation: Son  Code Status:   Allergies: Cymbalta; Lipitor; Metformin and related; Simvastatin; and Verapamil  Chief Complaint  Patient presents with  . New Admit To SNF    HPI: Patient is 81 y.o. female who was recently admitted at [facility] for psychosis last month and has been placed at assisted living facility in [place] recently was found to have generalized weakness. She was found to have ARF, and admitted from 9/15-19 for continued work-up and treatment. Pt's course was complicated by elevated troponin, eventually felt not to be cardiac and acute UTI. Pt is being admitted to SNF for continued generalized weakness for OT/PT. While at SNF pt will be followed for HTN tx with metoprolol, GERD, tx with protonix, and depression with psychosis tx with lexapro and loxapine.  Past Medical History  Diagnosis Date  . Hypertension   . Alzheimer disease   . Fibromyalgia   . Abnormal heart rhythm   . Colon polyp   . GERD (gastroesophageal reflux disease)   . Diabetes mellitus without complication   . Neuropathy   . HOCM (hypertrophic obstructive cardiomyopathy)   . Aortic stenosis     Past Surgical History  Procedure Laterality Date  . Shoulder surgery        Medication List       This list is accurate as of: 11/28/14 11:59 PM.  Always use your most recent med list.               aspirin 81 MG chewable tablet  Chew 81 mg by mouth daily.     cefdinir 300 MG capsule  Commonly known as:  OMNICEF  Take 1 capsule (300 mg total) by mouth 2 (two) times daily.     docusate sodium 100 MG  capsule  Commonly known as:  COLACE  Take 1 capsule (100 mg total) by mouth daily.     escitalopram 5 MG tablet  Commonly known as:  LEXAPRO  Take 5 mg by mouth daily.     fluticasone 50 MCG/ACT nasal spray  Commonly known as:  FLONASE  Place 2 sprays into both nostrils daily.     glimepiride 2 MG tablet  Commonly known as:  AMARYL  Take 2 mg by mouth daily.     loxapine 25 MG capsule  Commonly known as:  LOXITANE  Take 25 mg by mouth at bedtime.     pantoprazole 20 MG tablet  Commonly known as:  PROTONIX  Take 20 mg by mouth daily.     THERA Tabs  Take 1 tablet by mouth daily.     TOPROL XL 25 MG 24 hr tablet  Generic drug:  metoprolol succinate  Take 25 mg by mouth daily.     vitamin B-12 1000 MCG tablet  Commonly known as:  CYANOCOBALAMIN  Take 1,000 mcg by mouth daily.        No orders of the defined types were placed in this encounter.     There is no immunization history on file for this patient.  Social History  Substance Use Topics  . Smoking status: Never Smoker   . Smokeless tobacco:  Not on file  . Alcohol Use: No    Family history is +stroke, DM   Review of Systems  DATA OBTAINED: from patient, nurse, medical record GENERAL:  no fevers, fatigue, appetite changes SKIN: No itching, rash or wounds EYES: No eye pain, redness, discharge EARS: No earache, tinnitus, change in hearing NOSE: No congestion, drainage or bleeding  MOUTH/THROAT: No mouth or tooth pain, No sore throat RESPIRATORY: No cough, wheezing, SOB CARDIAC: No chest pain, palpitations, lower extremity edema  GI: No abdominal pain, No N/V/D or constipation, No heartburn or reflux  GU: No dysuria, frequency or urgency, or incontinence  MUSCULOSKELETAL: No unrelieved bone/joint pain NEUROLOGIC: No headache, dizziness or focal weakness PSYCHIATRIC: No c/o anxiety or sadness   Filed Vitals:   11/28/14 2054  BP: 119/73  Pulse: 76  Temp: 97.5 F (36.4 C)  Resp: 18    SpO2  Readings from Last 1 Encounters:  11/27/14 97%        Physical Exam  GENERAL APPEARANCE: Alert, conversant,  WF,No acute distress.  SKIN: No diaphoresis rash HEAD: Normocephalic, atraumatic  EYES: Conjunctiva/lids clear. Pupils round, reactive. EOMs intact.  EARS: External exam WNL, canals clear. Hearing grossly normal.  NOSE: No deformity or discharge.  MOUTH/THROAT: Lips w/o lesions  RESPIRATORY: Breathing is even, unlabored. Lung sounds are clear   CARDIOVASCULAR: Heart RRR 2/6 systolic murmur, no rubs or gallops. No peripheral edema.   GASTROINTESTINAL: Abdomen is soft, non-tender, not distended w/ normal bowel sounds. GENITOURINARY: Bladder non tender, not distended  MUSCULOSKELETAL: No abnormal joints or musculature NEUROLOGIC:  Cranial nerves 2-12 grossly intact. Moves all extremities  PSYCHIATRIC: Mood and affect appropriate to situation with dementia, no behavioral issues  Patient Active Problem List   Diagnosis Date Noted  . UTI (urinary tract infection) 12/02/2014  . GERD (gastroesophageal reflux disease) 12/02/2014  . Psychosis 12/02/2014  . Renal failure (ARF), acute on chronic 11/24/2014  . Hypertension 11/24/2014  . Diabetes mellitus type 2, controlled 11/24/2014  . MDD (major depressive disorder) 11/24/2014  . Pressure ulcer 11/24/2014  . Elevated troponin 11/23/2014    CBC    Component Value Date/Time   WBC 11.5* 11/26/2014 0535   RBC 3.97 11/26/2014 0535   HGB 11.9* 11/26/2014 0535   HCT 36.4 11/26/2014 0535   PLT 171 11/26/2014 0535   MCV 91.7 11/26/2014 0535   LYMPHSABS 1.8 11/24/2014 0849   MONOABS 0.8 11/24/2014 0849   EOSABS 0.1 11/24/2014 0849   BASOSABS 0.0 11/24/2014 0849    CMP     Component Value Date/Time   NA 142 11/26/2014 0535   K 3.4* 11/26/2014 0535   CL 111 11/26/2014 0535   CO2 23 11/26/2014 0535   GLUCOSE 109* 11/26/2014 0535   BUN 35* 11/26/2014 0535   CREATININE 1.20* 11/26/2014 0535   CALCIUM 8.8* 11/26/2014 0535    PROT 5.6* 11/26/2014 0535   ALBUMIN 2.8* 11/26/2014 0535   AST 23 11/26/2014 0535   ALT 18 11/26/2014 0535   ALKPHOS 65 11/26/2014 0535   BILITOT 0.8 11/26/2014 0535   GFRNONAA 40* 11/26/2014 0535   GFRAA 46* 11/26/2014 0535    No results found for: HGBA1C   Dg Chest 1 View  11/24/2014   CLINICAL DATA:  81 year old female with weakness  EXAM: CHEST  1 VIEW  COMPARISON:  None.  FINDINGS: Single-view of the chest demonstrate clear lungs. No pleural effusion or pneumothorax. A 6 mm nodular density in the right lower lung field superimposed over the right anterior  fourth rib may represent a vessel on end. A pulmonary nodule is not excluded. Direct comparison with prior imaging studies, if available, for follow-up with CT recommended. The cardiac silhouette is within normal limits. There is degenerative changes of the spine and shoulders. No acute fracture.  IMPRESSION: No acute cardiopulmonary process.  A vessel on end or a 6 mm right mid lung field pulmonary nodule. Follow-up recommended.   Electronically Signed   By: Anner Crete M.D.   On: 11/24/2014 01:00   Dg Pelvis 1-2 Views  11/24/2014   CLINICAL DATA:  81 year old female with right hip pain  EXAM: PELVIS - 1-2 VIEW  COMPARISON:  None.  FINDINGS: No acute fracture. There is degenerative changes of the visualized lower lumbar spine. The soft tissues are grossly unremarkable.  IMPRESSION: No acute fracture.   Electronically Signed   By: Anner Crete M.D.   On: 11/24/2014 00:57    Not all labs, radiology exams or other studies done during hospitalization come through on my EPIC note; however they are reviewed by me.    Assessment and Plan  Renal failure (ARF), acute on chronic :likely pre-renal azotemia from ARB/HCTZ use and poor oral intake. We held hydrochlorothiazide/HCTZ patient hydrated aggressively with normal saline, creatinine improving, 1.20 prior to discharge, follow electrolytes. Patient was able to void after  discontinuation of Foley SNF - encourage fluids, follow-up BMP  Elevated troponin Peak 0.42, doubt ACS-troponin pattern not consistent with ACS. Suspect false positive elevation in a setting of ARF. Importantly no Chest pain or SOB. 2-D echo shows an EF of 65-70%, LVH, no regional wall motion abnormalities. No further cardiac workup indicated. Discussed with the son  UTI (urinary tract infection) Klebsiella pneumonia UTI: Initially treated with Rocephin , urine culture shows greater than 100,000 colonies SNF - cont  Omnicef for another 7 days   Hypertension BP currently reasonably well controlled with Metoprolol SNF - cont metoprolol and Continue to hold Losartan/HCTZ.   Diabetes mellitus type 2, controlled CBG's reported borderlne but amaryl restarted at hospital; SNF - follow CBG until stable on amaryl;d/c amaryl if needed  GERD (gastroesophageal reflux disease) No sx;SNF - cont protonix  MDD (major depressive disorder) SNF - cont lexapro.  Psychosis SNF - cont loxapine   Time spent > 35 min;> 50% of time with patient was spent reviewing records, labs, tests and studies, counseling and developing plan of care  Hennie Duos, MD

## 2014-12-02 ENCOUNTER — Encounter: Payer: Self-pay | Admitting: Internal Medicine

## 2014-12-02 DIAGNOSIS — F29 Unspecified psychosis not due to a substance or known physiological condition: Secondary | ICD-10-CM | POA: Insufficient documentation

## 2014-12-02 DIAGNOSIS — N390 Urinary tract infection, site not specified: Secondary | ICD-10-CM | POA: Insufficient documentation

## 2014-12-02 DIAGNOSIS — K219 Gastro-esophageal reflux disease without esophagitis: Secondary | ICD-10-CM | POA: Insufficient documentation

## 2014-12-02 NOTE — Assessment & Plan Note (Signed)
No sx;SNF - cont protonix

## 2014-12-02 NOTE — Assessment & Plan Note (Signed)
SNF - cont loxapine

## 2014-12-02 NOTE — Assessment & Plan Note (Signed)
Klebsiella pneumonia UTI: Initially treated with Rocephin , urine culture shows greater than 100,000 colonies SNF - cont  Omnicef for another 7 days

## 2014-12-02 NOTE — Assessment & Plan Note (Signed)
:  likely pre-renal azotemia from ARB/HCTZ use and poor oral intake. We held hydrochlorothiazide/HCTZ patient hydrated aggressively with normal saline, creatinine improving, 1.20 prior to discharge, follow electrolytes. Patient was able to void after discontinuation of Foley SNF - encourage fluids, follow-up BMP

## 2014-12-02 NOTE — Assessment & Plan Note (Signed)
Peak 0.42, doubt ACS-troponin pattern not consistent with ACS. Suspect false positive elevation in a setting of ARF. Importantly no Chest pain or SOB. 2-D echo shows an EF of 65-70%, LVH, no regional wall motion abnormalities. No further cardiac workup indicated. Discussed with the son

## 2014-12-02 NOTE — Assessment & Plan Note (Signed)
BP currently reasonably well controlled with Metoprolol SNF - cont metoprolol and Continue to hold Losartan/HCTZ.

## 2014-12-02 NOTE — Assessment & Plan Note (Signed)
CBG's reported borderlne but amaryl restarted at hospital; SNF - follow CBG until stable on amaryl;d/c amaryl if needed

## 2014-12-02 NOTE — Assessment & Plan Note (Signed)
SNF - cont lexapro.

## 2014-12-11 ENCOUNTER — Non-Acute Institutional Stay (SKILLED_NURSING_FACILITY): Payer: Medicare Other | Admitting: Internal Medicine

## 2014-12-11 ENCOUNTER — Encounter: Payer: Self-pay | Admitting: Internal Medicine

## 2014-12-11 DIAGNOSIS — R4182 Altered mental status, unspecified: Secondary | ICD-10-CM

## 2014-12-11 DIAGNOSIS — N390 Urinary tract infection, site not specified: Secondary | ICD-10-CM | POA: Diagnosis not present

## 2014-12-11 DIAGNOSIS — I10 Essential (primary) hypertension: Secondary | ICD-10-CM | POA: Diagnosis not present

## 2014-12-11 HISTORY — DX: Altered mental status, unspecified: R41.82

## 2014-12-11 NOTE — Progress Notes (Signed)
Patient ID: Emma Mcguire, female   DOB: 07-01-1927, 81 y.o.   MRN: [id number] MRN: [id number] Name: Emma Mcguire  Sex: female Age: 81 y.o. DOB: [birth year]  [place] #: [name] Facility/Room:505 Level Of Care: SNF Provider: Wille Celeste Emergency Contacts: Extended Emergency Contact Information Primary Emergency Contact: Chelf,James Address: [address] [place], [place] [address] [place] Phone: [phone] Relation: Son  Code Status:   Allergies: Cymbalta; Lipitor; Metformin and related; Simvastatin; and Verapamil  Chief Complaint  Patient presents with  . Acute Visit  acute visit follow-up increased confusion-change in cognition  HPI: Patient is 81 y.o. female who was recently admitted at [facility] for psychosis last month and was placed at assisted living facility in [place] recently was found to have generalized weakness. She was found to have ARF, and admitted from 9/15-19 for continued work-up and treatment. Pt's course was complicated by elevated troponin, eventually felt not to be cardiac and acute UTI. Pt wasadmitted to SNF for continued generalized weakness for OT/PT. While at SNF   followed for HTN tx with metoprolol, GERD, tx with protonix, and depression with psychosis tx with lexapro and loxapine.--Her son feels she's had some increased confusion and declining cognition recently a urine culture is pending.  Patient also had elevated blood pressure readings I see a systolic of 761 and 950 Dr. Sheppard Coil did assess this and increased her Toprol XL to 50 mg a day from 25-this appears to be helping blood pressure today is 142/70 this will warrant further monitoring   Past Medical History  Diagnosis Date  . Hypertension   . Alzheimer disease   . Fibromyalgia   . Abnormal heart rhythm   . Colon polyp   . GERD (gastroesophageal reflux disease)   . Diabetes mellitus without complication ([place])   . Neuropathy ([place])   .  HOCM (hypertrophic obstructive cardiomyopathy) ([place])   . Aortic stenosis     Past Surgical History  Procedure Laterality Date  . Shoulder surgery        Medication List       This list is accurate as of: 12/11/14 11:59 PM.  Always use your most recent med list.               aspirin 81 MG chewable tablet  Chew 81 mg by mouth daily.     cefdinir 300 MG capsule  Commonly known as:  OMNICEF  Take 1 capsule (300 mg total) by mouth 2 (two) times daily.     docusate sodium 100 MG capsule  Commonly known as:  COLACE  Take 1 capsule (100 mg total) by mouth daily.     escitalopram 5 MG tablet  Commonly known as:  LEXAPRO  Take 5 mg by mouth daily.     fluticasone 50 MCG/ACT nasal spray  Commonly known as:  FLONASE  Place 2 sprays into both nostrils daily.     glimepiride 2 MG tablet  Commonly known as:  AMARYL  Take 2 mg by mouth daily.     loxapine 25 MG capsule  Commonly known as:  LOXITANE  Take 25 mg by mouth at bedtime.     pantoprazole 20 MG tablet  Commonly known as:  PROTONIX  Take 20 mg by mouth daily.     THERA Tabs  Take 1 tablet by mouth daily.     TOPROL XL 25 MG 24 hr tablet  Generic  drug:  metoprolol succinate  Take 50 mg by mouth daily.     vitamin B-12 1000 MCG tablet  Commonly known as:  CYANOCOBALAMIN  Take 1,000 mcg by mouth daily.        No orders of the defined types were placed in this encounter.     There is no immunization history on file for this patient.  Social History  Substance Use Topics  . Smoking status: Never Smoker   . Smokeless tobacco: Not on file  . Alcohol Use: No    Family history is +stroke, DM   Review of Systems  DATA OBTAINED: from patient, nurse, medical record GENERAL:  no fevers, fatigue, appetite changes SKIN: No itching, rash or wounds EYES: No eye pain, redness, discharge EARS: No earache, tinnitus, change in hearing NOSE: No congestion, drainage or bleeding  MOUTH/THROAT: No mouth or tooth  pain, No sore throat RESPIRATORY: No cough, wheezing, SOB CARDIAC: No chest pain, palpitations,  mild lower extremity edema  GI: No abdominal pain, No N/V/D or constipation, No heartburn or reflux  GU: No dysuria, frequency or urgency, or incontinence  MUSCULOSKELETAL: No unrelieved bone/joint pain NEUROLOGIC: No headache, dizziness or focal weakness PSYCHIATRIC: No c/o anxiety or sadness--her son feels she's had a declining cognition and some increased confusion   Filed Vitals:   12/11/14 2236  BP: 140/70  Pulse: 92  Temp: 97.6 F (36.4 C)  Resp: 16    SpO2 Readings from Last 1 Encounters:  11/27/14 97%        Physical Exam  GENERAL APPEARANCE: Alert, conversant,  WF,No acute distress.  SKIN: No diaphoresis rash HEAD: Normocephalic, atraumatic  EYES: Conjunctiva/lids clear. Pupils round, reactive. EOMs intact.  EARS: External exam WNL, canals clear. Hearing grossly normal.  NOSE: No deformity or discharge.  MOUTH/THROAT: oropharynx is clear mucous membranes moist  RESPIRATORY: Breathing is even, unlabored. Lung sounds are clear   CARDIOVASCULAR: Heart RRR 2/6 systolic murmur, no rubs or gallops. Mild  peripheral edema.   GASTROINTESTINAL: Abdomen is soft, non-tender, not distended w/ normal bowel sounds. GENITOURINARY:some possible mild suprapubic tenderness to palpation, not distended  MUSCULOSKELETAL: No abnormal joints or musculature NEUROLOGIC:  Cranial nerves 2-12 grossly intact. Moves all extremities  PSYCHIATRIC: Mood and affect appropriate to situation with what appears to be 6 ementia-she is talking interactive oriented to self she is able to follow simple verbal commands  Patient Active Problem List   Diagnosis Date Noted  . Altered mental status 12/11/2014  . UTI (urinary tract infection) 12/02/2014  . GERD (gastroesophageal reflux disease) 12/02/2014  . Psychosis 12/02/2014  . Renal failure (ARF), acute on chronic (HCC) 11/24/2014  . Hypertension  11/24/2014  . Diabetes mellitus type 2, controlled ([place]) 11/24/2014  . MDD (major depressive disorder) ([place]) 11/24/2014  . Pressure ulcer 11/24/2014  . Elevated troponin 11/23/2014    CBC    Component Value Date/Time   WBC 11.5* 11/26/2014 0535   RBC 3.97 11/26/2014 0535   HGB 11.9* 11/26/2014 0535   HCT 36.4 11/26/2014 0535   PLT 171 11/26/2014 0535   MCV 91.7 11/26/2014 0535   LYMPHSABS 1.8 11/24/2014 0849   MONOABS 0.8 11/24/2014 0849   EOSABS 0.1 11/24/2014 0849   BASOSABS 0.0 11/24/2014 0849    CMP     Component Value Date/Time   NA 142 11/26/2014 0535   K 3.4* 11/26/2014 0535   CL 111 11/26/2014 0535   CO2 23 11/26/2014 0535   GLUCOSE 109* 11/26/2014 0535   BUN  35* 11/26/2014 0535   CREATININE 1.20* 11/26/2014 0535   CALCIUM 8.8* 11/26/2014 0535   PROT 5.6* 11/26/2014 0535   ALBUMIN 2.8* 11/26/2014 0535   AST 23 11/26/2014 0535   ALT 18 11/26/2014 0535   ALKPHOS 65 11/26/2014 0535   BILITOT 0.8 11/26/2014 0535   GFRNONAA 40* 11/26/2014 0535   GFRAA 46* 11/26/2014 0535    No results found for: HGBA1C   Dg Chest 1 View  11/24/2014   CLINICAL DATA:  81 year old female with weakness  EXAM: CHEST  1 VIEW  COMPARISON:  None.  FINDINGS: Single-view of the chest demonstrate clear lungs. No pleural effusion or pneumothorax. A 6 mm nodular density in the right lower lung field superimposed over the right anterior fourth rib may represent a vessel on end. A pulmonary nodule is not excluded. Direct comparison with prior imaging studies, if available, for follow-up with CT recommended. The cardiac silhouette is within normal limits. There is degenerative changes of the spine and shoulders. No acute fracture.  IMPRESSION: No acute cardiopulmonary process.  A vessel on end or a 6 mm right mid lung field pulmonary nodule. Follow-up recommended.   Electronically Signed   By: Anner Crete M.D.   On: 11/24/2014 01:00   Dg Pelvis 1-2 Views  11/24/2014   CLINICAL DATA:   81 year old female with right hip pain  EXAM: PELVIS - 1-2 VIEW  COMPARISON:  None.  FINDINGS: No acute fracture. There is degenerative changes of the visualized lower lumbar spine. The soft tissues are grossly unremarkable.  IMPRESSION: No acute fracture.   Electronically Signed   By: Anner Crete M.D.   On: 11/24/2014 00:57    Not all labs, radiology exams or other studies done during hospitalization come through on my EPIC note; however they are reviewed by me.    Assessment and Plan  #1 confusion-patient appears to have some baseline dementia son feels oh this has increased somewhat will await results of the urine culture which was previously ordered by Dr. Cline Crock will check blood work including a BMP and CBC with differential tomorrow She was recently treated for UTI.  #2 hypertension again her Toprol has recently been increased systolic blood pressure appears improved today at 142/70 at this point continue to monitor.  History of acute renal failure-this apparently improved with hydration in the hospital and hydrochlorothiazide discontinuance again her blood pressure was elevated and her Toprol has been increased it appears with beneficial effect  [id number]-of note greater than 25 minutes spent assessing patientdiscussing her status with her son-discussing her status with nursing-reviewing her chart-in coordinating and formulating a plan of care-of note greater than 50% of time spent coordinating plan of care    Of note this plan of action was discussed with her son in the facility.        LASSEN, ARLO C,

## 2014-12-18 ENCOUNTER — Non-Acute Institutional Stay (SKILLED_NURSING_FACILITY): Payer: Medicare Other | Admitting: Internal Medicine

## 2014-12-18 ENCOUNTER — Encounter: Payer: Self-pay | Admitting: Internal Medicine

## 2014-12-18 ENCOUNTER — Emergency Department (HOSPITAL_COMMUNITY): Payer: Medicare Other

## 2014-12-18 ENCOUNTER — Encounter (HOSPITAL_COMMUNITY): Payer: Self-pay | Admitting: *Deleted

## 2014-12-18 ENCOUNTER — Emergency Department (HOSPITAL_COMMUNITY)
Admission: EM | Admit: 2014-12-18 | Discharge: 2014-12-18 | Disposition: A | Discharge status: Home / Self Care | Payer: Medicare Other | Attending: Emergency Medicine | Admitting: Emergency Medicine

## 2014-12-18 DIAGNOSIS — F0393 Unspecified dementia, unspecified severity, with mood disturbance: Secondary | ICD-10-CM

## 2014-12-18 DIAGNOSIS — F329 Major depressive disorder, single episode, unspecified: Secondary | ICD-10-CM | POA: Insufficient documentation

## 2014-12-18 DIAGNOSIS — G309 Alzheimer's disease, unspecified: Secondary | ICD-10-CM | POA: Insufficient documentation

## 2014-12-18 DIAGNOSIS — F0390 Unspecified dementia without behavioral disturbance: Secondary | ICD-10-CM | POA: Diagnosis not present

## 2014-12-18 DIAGNOSIS — R627 Adult failure to thrive: Secondary | ICD-10-CM | POA: Diagnosis not present

## 2014-12-18 DIAGNOSIS — T50905A Adverse effect of unspecified drugs, medicaments and biological substances, initial encounter: Secondary | ICD-10-CM

## 2014-12-18 DIAGNOSIS — Z86010 Personal history of colonic polyps: Secondary | ICD-10-CM | POA: Insufficient documentation

## 2014-12-18 DIAGNOSIS — Z7951 Long term (current) use of inhaled steroids: Secondary | ICD-10-CM | POA: Insufficient documentation

## 2014-12-18 DIAGNOSIS — R4182 Altered mental status, unspecified: Secondary | ICD-10-CM

## 2014-12-18 DIAGNOSIS — I10 Essential (primary) hypertension: Secondary | ICD-10-CM | POA: Insufficient documentation

## 2014-12-18 DIAGNOSIS — Z87440 Personal history of urinary (tract) infections: Secondary | ICD-10-CM | POA: Insufficient documentation

## 2014-12-18 DIAGNOSIS — R531 Weakness: Secondary | ICD-10-CM | POA: Diagnosis not present

## 2014-12-18 DIAGNOSIS — K219 Gastro-esophageal reflux disease without esophagitis: Secondary | ICD-10-CM | POA: Insufficient documentation

## 2014-12-18 DIAGNOSIS — Z792 Long term (current) use of antibiotics: Secondary | ICD-10-CM | POA: Insufficient documentation

## 2014-12-18 DIAGNOSIS — Z5189 Encounter for other specified aftercare: Secondary | ICD-10-CM | POA: Diagnosis not present

## 2014-12-18 DIAGNOSIS — F0280 Dementia in other diseases classified elsewhere without behavioral disturbance: Secondary | ICD-10-CM | POA: Insufficient documentation

## 2014-12-18 DIAGNOSIS — E119 Type 2 diabetes mellitus without complications: Secondary | ICD-10-CM | POA: Insufficient documentation

## 2014-12-18 DIAGNOSIS — Z79899 Other long term (current) drug therapy: Secondary | ICD-10-CM | POA: Insufficient documentation

## 2014-12-18 DIAGNOSIS — T43595A Adverse effect of other antipsychotics and neuroleptics, initial encounter: Secondary | ICD-10-CM | POA: Insufficient documentation

## 2014-12-18 DIAGNOSIS — Z7982 Long term (current) use of aspirin: Secondary | ICD-10-CM | POA: Insufficient documentation

## 2014-12-18 LAB — CBC WITH DIFFERENTIAL/PLATELET
BASOS ABS: 0 10*3/uL (ref 0.0–0.1)
BASOS PCT: 0 %
EOS ABS: 0.1 10*3/uL (ref 0.0–0.7)
Eosinophils Relative: 0 %
HEMATOCRIT: 43 % (ref 36.0–46.0)
HEMOGLOBIN: 14.2 g/dL (ref 12.0–15.0)
Lymphocytes Relative: 12 %
Lymphs Abs: 1.4 10*3/uL (ref 0.7–4.0)
MCH: 30.1 pg (ref 26.0–34.0)
MCHC: 33 g/dL (ref 30.0–36.0)
MCV: 91.3 fL (ref 78.0–100.0)
Monocytes Absolute: 1.3 10*3/uL — ABNORMAL HIGH (ref 0.1–1.0)
Monocytes Relative: 11 %
NEUTROS ABS: 8.8 10*3/uL — AB (ref 1.7–7.7)
NEUTROS PCT: 77 %
Platelets: 209 10*3/uL (ref 150–400)
RBC: 4.71 MIL/uL (ref 3.87–5.11)
RDW: 13 % (ref 11.5–15.5)
WBC: 11.6 10*3/uL — AB (ref 4.0–10.5)

## 2014-12-18 LAB — COMPREHENSIVE METABOLIC PANEL
ALK PHOS: 103 U/L (ref 38–126)
ALT: 25 U/L (ref 14–54)
ANION GAP: 9 (ref 5–15)
AST: 34 U/L (ref 15–41)
Albumin: 3.2 g/dL — ABNORMAL LOW (ref 3.5–5.0)
BILIRUBIN TOTAL: 0.9 mg/dL (ref 0.3–1.2)
BUN: 23 mg/dL — ABNORMAL HIGH (ref 6–20)
CALCIUM: 9.9 mg/dL (ref 8.9–10.3)
CO2: 25 mmol/L (ref 22–32)
CREATININE: 0.98 mg/dL (ref 0.44–1.00)
Chloride: 107 mmol/L (ref 101–111)
GFR, EST AFRICAN AMERICAN: 59 mL/min — AB (ref >60)
GFR, EST NON AFRICAN AMERICAN: 51 mL/min — AB (ref >60)
Glucose, Bld: 128 mg/dL — ABNORMAL HIGH (ref 65–99)
Potassium: 4 mmol/L (ref 3.5–5.1)
SODIUM: 141 mmol/L (ref 135–145)
TOTAL PROTEIN: 6.5 g/dL (ref 6.5–8.1)

## 2014-12-18 LAB — URINALYSIS, ROUTINE W REFLEX MICROSCOPIC
Glucose, UA: NEGATIVE mg/dL
HGB URINE DIPSTICK: NEGATIVE
Ketones, ur: NEGATIVE mg/dL
Leukocytes, UA: NEGATIVE
NITRITE: NEGATIVE
PROTEIN: NEGATIVE mg/dL
SPECIFIC GRAVITY, URINE: 1.025 (ref 1.005–1.030)
UROBILINOGEN UA: 1 mg/dL (ref 0.0–1.0)
pH: 5.5 (ref 5.0–8.0)

## 2014-12-18 LAB — I-STAT CG4 LACTIC ACID, ED: LACTIC ACID, VENOUS: 1.67 mmol/L (ref 0.5–2.0)

## 2014-12-18 MED ORDER — SODIUM CHLORIDE 0.9 % IV SOLN
INTRAVENOUS | Status: DC
  Administered 2014-12-18: 18:00:00 via INTRAVENOUS

## 2014-12-18 NOTE — Discharge Instructions (Signed)
Confusion/Failure to Thrive/Depression/Dementia (see below) Consideration has been given to a medication side effect  (of loxapine, an antipsychotic medication) as the cause of significantly decreased activity/social interaction/appetite. At this time discontinue loxapine, it can have Parkinson-like symptoms. Discuss this with your treating physician tomorrow.   Confusion is the inability to think with your usual speed or clarity. Confusion may come on quickly or slowly over time. How quickly the confusion comes on depends on the cause. Confusion can be due to any number of causes. CAUSES   Concussion, head injury, or head trauma.  Seizures.  Stroke.  Fever.  Brain tumor.  Age related decreased brain function (dementia).  Heightened emotional states like rage or terror.  Mental illness in which the person loses the ability to determine what is real and what is not (hallucinations).  Infections such as a urinary tract infection (UTI).  Toxic effects from alcohol, drugs, or prescription medicines.  Dehydration and an imbalance of salts in the body (electrolytes).  Lack of sleep.  Low blood sugar (diabetes).  Low levels of oxygen from conditions such as chronic lung disorders.  Drug interactions or other medicine side effects.  Nutritional deficiencies, especially niacin, thiamine, vitamin C, or vitamin B.  Sudden drop in body temperature (hypothermia).  Change in routine, such as when traveling or hospitalized. SIGNS AND SYMPTOMS  People often describe their thinking as cloudy or unclear when they are confused. Confusion can also include feeling disoriented. That means you are unaware of where or who you are. You may also not know what the date or time is. If confused, you may also have difficulty paying attention, remembering, and making decisions. Some people also act aggressively when they are confused.  DIAGNOSIS  The medical evaluation of confusion may include:  Blood  and urine tests.  X-rays.  Brain and nervous system tests.  Analyzing your brain waves (electroencephalogram or EEG).  Magnetic resonance imaging (MRI) of your head.  Computed tomography (CT) scan of your head.  Mental status tests in which your health care provider may ask many questions. Some of these questions may seem silly or strange, but they are a very important test to help diagnose and treat confusion. TREATMENT  An admission to the hospital may not be needed, but a person with confusion should not be left alone. Stay with a family member or friend until the confusion clears. Avoid alcohol, pain relievers, or sedative drugs until you have fully recovered. Do not drive until directed by your health care provider. HOME CARE INSTRUCTIONS  What family and friends can do:  To find out if someone is confused, ask the person to state his or her name, age, and the date. If the person is unsure or answers incorrectly, he or she is confused.  Always introduce yourself, no matter how well the person knows you.  Often remind the person of his or her location.  Place a calendar and clock near the confused person.  Help the person with his or her medicines. You may want to use a pill box, an alarm as a reminder, or give the person each dose as prescribed.  Talk about current events and plans for the day.  Try to keep the environment calm, quiet, and peaceful.  Make sure the person keeps follow-up visits with his or her health care provider. PREVENTION  Ways to prevent confusion:  Avoid alcohol.  Eat a balanced diet.  Get enough sleep.  Take medicine only as directed by your health care  provider.  Do not become isolated. Spend time with other people and make plans for your days.  Keep careful watch on your blood sugar levels if you are diabetic. SEEK IMMEDIATE MEDICAL CARE IF:   You develop severe headaches, repeated vomiting, seizures, blackouts, or slurred  speech.  There is increasing confusion, weakness, numbness, restlessness, or personality changes.  You develop a loss of balance, have marked dizziness, feel uncoordinated, or fall.  You have delusions, hallucinations, or develop severe anxiety.  Your family members think you need to be rechecked.   This information is not intended to replace advice given to you by your health care provider. Make sure you discuss any questions you have with your health care provider.   Document Released: 04/03/2004 Document Revised: 03/17/2014 Document Reviewed: 04/01/2013 Elsevier Interactive Patient Education 2016 [employer].   Failure to Thrive, Adult Failure to thrive is a group of symptoms that affect elderly adults. These symptoms include loss of appetite and weight loss. People who have this condition may do fewer and fewer activities over time. They may lose interest in being with friends or they may not want to eat or drink. This condition is not a normal part of aging. CAUSES This condition may be caused by:  A disease, such dementia, diabetes, cancer, or lung disease.  A health problem, such as a vitamin deficiency or a heart problem.  A disorder, such as depression.  A disability.  Medicines.  Mistreatment or neglect. In some cases, the cause may not be known. SYMPTOMS Symptoms of this condition include:  Loss of more than 5% of your body weight.  Being more tired than normal after an activity.  Having trouble getting up after sitting.  Loss of appetite.  Not getting out of bed.  Not wanting to do usual activities.  Depression.  Getting infections often.  Bedsores.  Taking a long time to recover after an injury or a surgery.  Weakness. DIAGNOSIS This condition may be diagnosed with a physical exam. Your health care provider will ask questions about your health, behavior, and mood, such as:  Has your activity changed?  Do you seem sad?  Are your eating  habits different? Tests may also be done. They may include:  Blood tests.  Urine tests.  Imaging tests, such as X-rays, a CT scan, or MRI.  Hearing tests.  Vision tests.  Tests to check thinking ability (cognitive tests).  Activity tests to see if you can do tasks such as bathing and dressing and to see if you can move around safely. You may be referred to a specialist. TREATMENT Treatment for this condition depends on the cause. It may involve:  Treating the cause.  Talk therapy or medicine to treat depression.  Improving diet, such as by eating more often or taking nutritional supplements.  Changing or stopping a medicine.  Physical therapy. It often takes a team of health care providers to find the right treatment. HOME CARE INSTRUCTIONS  Take over-the-counter and prescription medicines only as told by your health care provider.  Eat a healthy, well-balanced diet. Make sure to get enough calories in each meal.  Be physically active. Include strength training as part of your exercise routine. A physical therapist can help to set up an exercise program that fits you.  Make sure that you are safe at home.  Make sure that you have a plan for what to do if you become unable to make decisions for yourself. SEEK MEDICAL CARE IF:  You are not able to eat well.  You are not able to move around.  You feel very sad or hopeless. SEEK IMMEDIATE MEDICAL CARE IF:  You have thoughts of ending your life.  You cannot eat or drink.  You do not get out of bed.  Staying at home is no longer safe.  You have a fever.   This information is not intended to replace advice given to you by your health care provider. Make sure you discuss any questions you have with your health care provider.   Document Released: 05/19/2011 Document Revised: 11/15/2014 Document Reviewed: 05/22/2014 Elsevier Interactive Patient Education [employer].

## 2014-12-18 NOTE — ED Notes (Signed)
I attempted x3 to in and out cath patient, unsuccessful x3. RN Abbie at bedside to assist. Still unsuccessful x3. Will notify RN Claiborne Billings.

## 2014-12-18 NOTE — Progress Notes (Signed)
Patient ID: Emma Mcguire, female   DOB: October 16, 1927, 81 y.o.   MRN: [id number]  MRN: [id number] Name: Emma Mcguire  Sex: female Age: 81 y.o. DOB: January 09, 1928  [place] [facility]: [name] Facility/Room:505 Level Of Care: SNF Provider: Wille Celeste Emergency Contacts: Extended Emergency Contact Information Primary Emergency Contact: Pospisil,James Address: [address] [place], [place] [address] [place] Phone: [phone] Relation: Son  Code Status:   Allergies: Cymbalta; Lipitor; Metformin and related; Simvastatin; and Verapamil  Chief Complaint  Patient presents with  . Acute Visit  acute visit follow-up increased confusion-change in cognition and increased weakness  HPI: Patient is 81 y.o. female who was recently admitted at [facility] for psychosis last month and was placed at assisted living facility in [place] recently was found to have generalized weakness. She was found to have ARF, and admitted from 9/15-19 for continued work-up and treatment. Pt's course was complicated by elevated troponin, eventually felt not to be cardiac and acute UTI. Pt wasadmitted to SNF for continued generalized weakness for OT/PT. While at SNF   followed for HTN tx with metoprolol, GERD, tx with protonix, and depression with psychosis tx with lexapro and loxapine.-- I spoke with her son last week he was concerned about what he thought was declining status continued weakness and decreased cognition and lethargy-lab work was ordered which appeared to be relatively unremarkable urine culture also appear to be unremarkable-.  Her vital signs appear to be stable she still has somewhat elevatedblood pressures at times most recently 160/98 150/90 .    Dr. Sheppard Coil did assess this  previously and increased her Toprol XL to 50 mg a day from 25--this appears to have helped although may need further titration.  Againher son's main concern is in continued weakness and  some lethargy    Past Medical History  Diagnosis Date  . Hypertension   . Alzheimer disease   . Fibromyalgia   . Abnormal heart rhythm   . Colon polyp   . GERD (gastroesophageal reflux disease)   . Diabetes mellitus without complication ([place])   . Neuropathy ([place])   . HOCM (hypertrophic obstructive cardiomyopathy) ([place])   . Aortic stenosis     Past Surgical History  Procedure Laterality Date  . Shoulder surgery        Medication List       This list is accurate as of: 12/18/14 11:59 PM.  Always use your most recent med list.               aspirin 81 MG chewable tablet  Chew 81 mg by mouth daily.     bisacodyl 10 MG suppository  Commonly known as:  DULCOLAX  Place 10 mg rectally daily as needed for mild constipation or moderate constipation.     cefdinir 300 MG capsule  Commonly known as:  OMNICEF  Take 1 capsule (300 mg total) by mouth 2 (two) times daily.     docusate sodium 100 MG capsule  Commonly known as:  COLACE  Take 1 capsule (100 mg total) by mouth daily.     escitalopram 5 MG tablet  Commonly known as:  LEXAPRO  Take 5 mg by mouth daily.     fluticasone 50 MCG/ACT nasal spray  Commonly known as:  FLONASE  Place 2 sprays into both nostrils daily.     glimepiride 2 MG tablet  Commonly known as:  AMARYL  Take  2 mg by mouth daily.     GLUCERNA Liqd  Take 237 mLs by mouth daily as needed (if patient refuses meal.).     magnesium hydroxide 400 MG/5ML suspension  Commonly known as:  MILK OF MAGNESIA  Take 30 mLs by mouth daily as needed for mild constipation.     metoprolol succinate 50 MG 24 hr tablet  Commonly known as:  TOPROL-XL  Take 50 mg by mouth daily. Take with or immediately following a meal.     pantoprazole 20 MG tablet  Commonly known as:  PROTONIX  Take 20 mg by mouth daily.     THERA Tabs  Take 1 tablet by mouth daily.     vitamin B-12 1000 MCG tablet  Commonly known as:  CYANOCOBALAMIN  Take 1,000 mcg by mouth daily.         No orders of the defined types were placed in this encounter.     There is no immunization history on file for this patient.  Social History  Substance Use Topics  . Smoking status: Never Smoker   . Smokeless tobacco: Not on file  . Alcohol Use: No    Family history is +stroke, DM   Review of Systems  DATA OBTAINED: from patient, nurse  son, medical record GENERAL:  no fevers--has, fatigue, appetite changes SKIN: No itching, rash or wounds EYES: No eye pain, redness, discharge EARS: No earache, tinnitus, change in hearing NOSE: No congestion, drainage or bleeding  MOUTH/THROAT: No mouth or tooth pain, No sore throat RESPIRATORY: No cough, wheezing, SOB CARDIAC: No chest pain, palpitations,  mild lower extremity edema  GI: No abdominal pain, No N/V/D or constipation, No heartburn or reflux  GU: No dysuria, frequency or urgency, or incontinence  MUSCULOSKELETAL: No unrelieved bone/joint pain continues to have weakness her son feels this is progressing NEUROLOGIC: No headache, dizziness or focal weakness PSYCHIATRIC: No c/o anxiety or sadness--her son feels she's had a declining cognition and some increased confusion which he feels is not improving   Filed Vitals:   12/18/14 2155  BP: 150/90  Pulse: 92  Resp: 18    SpO2 Readings from Last 1 Encounters:  12/18/14 94%        Physical Exam  GENERAL APPEARANCE: Alert, conversant,  WF,No acute distress appears quite frail.  SKIN: No diaphoresis rash HEAD: Normocephalic, atraumatic  EYES: Conjunctiva/lids clear. Pupils round, reactive. EOMs intact.  EARS: External exam WNL, canals clear. Hearing grossly normal.  NOSE: No deformity or discharge.  MOUTH/THROAT: oropharynx is clear mucous membranesslightly dry RESPIRATORY: Breathing is even, unlabored. Lung sounds are clear air entry is shallow  CARDIOVASCULAR: Heart RRR 2/6 systolic murmur, no rubs or gallops. Mild  peripheral edema.   GASTROINTESTINAL: Abdomen  is --soft, non-tender, not distended w/ normal bowel sounds. GENITOURINARY:, not distended--does not appear to have overt suprapubic tenderness  MUSCULOSKELETAL: No abnormal joints or musculature grip strength appears preserved upper extremities has some lower extremity weakness but is able to move her legs bilaterally-- quite frail and weak however NEUROLOGIC:  Cranial nerves 2-12 grossly intact. Moves all extremities--she does have somewhat slow movements however-- questionable cogwheeling  upper extremities-- her movement  is somewhat slow with what appears to be rigidity  PSYCHIATRIC: Mood and affect appropriate to situation with what appears to be d Eloise Harman is talking interactive oriented to self she is able to follow simple verbal commands--she is oriented to her son and with prompting her nurse-her speech is of quite low volume and  somewhat slow  Patient Active Problem List   Diagnosis Date Noted  . Weakness 12/18/2014  . Altered mental status 12/11/2014  . UTI (urinary tract infection) 12/02/2014  . GERD (gastroesophageal reflux disease) 12/02/2014  . Psychosis 12/02/2014  . Renal failure (ARF), acute on chronic ([facility]) 11/24/2014  . Hypertension 11/24/2014  . Diabetes mellitus type 2, controlled ([place]) 11/24/2014  . MDD (major depressive disorder) ([place]) 11/24/2014  . Pressure ulcer 11/24/2014  . Elevated troponin 11/23/2014     Labs.  12/12/2014.  WBC 7.4 hemoglobin 12.6 platelets 294.  Sodium 143 potassium 4 BUN 17 creatinine 1.0.  Assessment and plan.  #1-continued weakness and lethargy and altered mental status-her son is quite concerned here -this has been persisting now for some time-recent lab work did not appear to be remarkable urine cultures so far does not appear to be very remarkable either-secondary to family concerns and this continued weakness will send to the ER for workup I suspect she will need a CT scan-clinically she appears stable although quite  weak This plan was discussed with her son who is in agreement.  [id number]                  ,

## 2014-12-18 NOTE — ED Provider Notes (Signed)
CSN: [id number]     Arrival date & time 12/18/14  1453 History   First MD Initiated Contact with Patient 12/18/14 1528     Chief Complaint  Patient presents with  . Weakness  . Recent UTI      (Consider location/radiation/quality/duration/timing/severity/associated sxs/prior Treatment) HPI Patient having steady decline since transition to rehab from hospitalization a week ago. Has become nonverbal, not eating and not getting up. Patient has periods where she will speak at certain times and at other times becomes essentially nonverbal. Had recent diagnosis of UTI and treated with antibiotics. Also had hospitalization about 3 weeks ago at [facility] treated for depression with diagnosis of possible Parkinson's Dx. She may have been started on new meds for depression per son but he is not certain. Son reports she was conversational at normal level, living independently in her home and walking with walker 6 weeks ago. Patient gives no verbal history.  Patient's son reports however after I left the room and her other daughter came in, the patient was answering questions speaking directly to her in a oriented fashion. She reports that once she had been asked to many questions she stated she was asked to many questions and close her eyes and stopped responding. Past Medical History  Diagnosis Date  . Hypertension   . Alzheimer disease   . Fibromyalgia   . Abnormal heart rhythm   . Colon polyp   . GERD (gastroesophageal reflux disease)   . Diabetes mellitus without complication ([place])   . Neuropathy ([place])   . HOCM (hypertrophic obstructive cardiomyopathy) ([place])   . Aortic stenosis    Past Surgical History  Procedure Laterality Date  . Shoulder surgery     Family History  Problem Relation Age of Onset  . Stroke Mother   . Diabetes Mellitus II Maternal Grandmother    Social History  Substance Use Topics  . Smoking status: Never Smoker   . Smokeless tobacco: None  . Alcohol Use: No   OB  History    No data available     Review of Systems  Cannot obtain ROS from patient level 5 caveat.  Allergies  Cymbalta; Lipitor; Metformin and related; Simvastatin; and Verapamil  Home Medications   Prior to Admission medications   Medication Sig Start Date End Date Taking? Authorizing Provider  aspirin 81 MG chewable tablet Chew 81 mg by mouth daily. 11/09/14 11/09/15 Yes Historical Provider, MD  bisacodyl (DULCOLAX) 10 MG suppository Place 10 mg rectally daily as needed for mild constipation or moderate constipation.   Yes Historical Provider, MD  docusate sodium (COLACE) 100 MG capsule Take 1 capsule (100 mg total) by mouth daily. 11/27/14  Yes Reyne Dumas, MD  escitalopram (LEXAPRO) 5 MG tablet Take 5 mg by mouth daily. 11/09/14 02/07/15 Yes Historical Provider, MD  fluticasone (FLONASE) 50 MCG/ACT nasal spray Place 2 sprays into both nostrils daily.   Yes Historical Provider, MD  GLUCERNA (GLUCERNA) LIQD Take 237 mLs by mouth daily as needed (if patient refuses meal.).   Yes Historical Provider, MD  loxapine (LOXITANE) 25 MG capsule Take 25 mg by mouth at bedtime. 11/09/14 11/09/15 Yes Historical Provider, MD  magnesium hydroxide (MILK OF MAGNESIA) 400 MG/5ML suspension Take 30 mLs by mouth daily as needed for mild constipation.   Yes Historical Provider, MD  metoprolol succinate (TOPROL-XL) 50 MG 24 hr tablet Take 50 mg by mouth daily. Take with or immediately following a meal.   Yes Historical Provider, MD  Multiple Vitamin (THERA)  TABS Take 1 tablet by mouth daily. 11/09/14 11/09/15 Yes Historical Provider, MD  pantoprazole (PROTONIX) 20 MG tablet Take 20 mg by mouth daily.   Yes Historical Provider, MD  vitamin B-12 (CYANOCOBALAMIN) 1000 MCG tablet Take 1,000 mcg by mouth daily. 11/09/14 11/09/15 Yes Historical Provider, MD  cefdinir (OMNICEF) 300 MG capsule Take 1 capsule (300 mg total) by mouth 2 (two) times daily. 11/27/14   Reyne Dumas, MD  glimepiride (AMARYL) 2 MG tablet Take 2 mg by mouth  daily. 11/09/14 11/09/15  Historical Provider, MD   BP 158/63 mmHg  Pulse 80  Temp(Src) 99.9 F (37.7 C) (Rectal)  Resp 23  Wt 181 lb (82.101 kg)  SpO2 94% Physical Exam  Constitutional: She appears well-developed and well-nourished. She appears listless.  Nontoxic, no respiratory distress. Does not open eyes.  HENT:  Head: Normocephalic and atraumatic.  Right Ear: External ear normal.  Left Ear: External ear normal.  Nose: Nose normal.  Mucous membrane dry  Eyes: Pupils are equal, round, and reactive to light.  Neck: Neck supple.  Cardiovascular: Normal rate, regular rhythm, normal heart sounds and intact distal pulses.   Pulmonary/Chest: Effort normal and breath sounds normal.  Abdominal: Soft. Bowel sounds are normal. She exhibits no distension. There is no tenderness.  Musculoskeletal: Normal range of motion. She exhibits no edema or tenderness.  Neurological: She appears listless. GCS eye subscore is 3. GCS verbal subscore is 2. GCS motor subscore is 5.  Doesn't interact or follow commands.   Skin: Skin is warm, dry and intact.    ED Course  Procedures (including critical care time) Labs Review Labs Reviewed  COMPREHENSIVE METABOLIC PANEL - Abnormal; Notable for the following:    Glucose, Bld 128 (*)    BUN 23 (*)    Albumin 3.2 (*)    GFR calc non Af Amer 51 (*)    GFR calc Af Amer 59 (*)    All other components within normal limits  CBC WITH DIFFERENTIAL/PLATELET - Abnormal; Notable for the following:    WBC 11.6 (*)    Neutro Abs 8.8 (*)    Monocytes Absolute 1.3 (*)    All other components within normal limits  URINALYSIS, ROUTINE W REFLEX MICROSCOPIC (NOT AT [facility]) - Abnormal; Notable for the following:    Color, Urine AMBER (*)    Bilirubin Urine MODERATE (*)    All other components within normal limits  URINE CULTURE  I-STAT CG4 LACTIC ACID, ED    Imaging Review Ct Head Wo Contrast  12/18/2014   CLINICAL DATA:  Per ems pt is from [facility]. Pt does not talk much. Denies pain. Hx of dementia. Family reports alert per norm, but more lethargic, in ED 1 week ago for UTI, discharged to [facility]. Since then pt has not really recovered. Just "more lethargic than usual". md at facility saw pt and noted "signs of parkinsons  EXAM: CT HEAD WITHOUT CONTRAST  TECHNIQUE: Contiguous axial images were obtained from the base of the skull through the vertex without intravenous contrast.  COMPARISON:  None.  FINDINGS: The ventricles are normal in configuration. There is ventricular and sulcal enlargement reflecting mild diffuse age related atrophy. Patchy white matter hypoattenuation is noted consistent with moderate to advanced chronic microvascular ischemic change. There are no parenchymal masses or mass effect. There is no evidence of a cortical infarct there are no extra-axial masses or abnormal fluid collections.  There is no intracranial hemorrhage.  The  visualized sinuses and mastoid air cells are clear.  IMPRESSION: 1. No acute finding. 2. Mild generalized atrophy. Moderate to advanced chronic microvascular ischemic change.   Electronically Signed   By: Lajean Manes M.D.   On: 12/18/2014 18:13   Dg Chest Port 1 View  12/18/2014   CLINICAL DATA:  Mental status change.  EXAM: PORTABLE CHEST 1 VIEW  COMPARISON:  11/24/2014 chest radiograph  FINDINGS: Stable cardiomediastinal silhouette with normal heart size and atherosclerotic thoracic aorta. No pneumothorax. No pleural effusion. No pulmonary edema. There is stable mild scarring versus atelectasis at the left costophrenic angle. No new focal lung opacity. There is stable severe osteoarthritis in the left glenohumeral joint.  IMPRESSION: Stable mild scarring versus atelectasis at the lateral left lung base. Otherwise no active disease in the chest.   Electronically Signed   By: Ilona Sorrel M.D.   On: 12/18/2014 17:04   I have personally reviewed and evaluated these images and lab  results as part of my medical decision-making.   EKG Interpretation None      MDM   Final diagnoses:  Altered mental status, unspecified altered mental status type  Adult failure to thrive  Depression due to dementia  Medication side effect, initial encounter   The patient presents as outlined above. Description is somewhat of a failure to thrive. Her son also notes that another physician felt that there were parkinsonian type symptoms. The patient's son is not sure if she was started on new medications when she had an approximately week long psychiatric hospitalization with depression. Her medication list indicates that she has loxapine. Given the timeframe that her son describes, I felt this could be a medication reaction particularly to an anti-psychotic. He described a rather slow decrease in her home functioning abilities and then a significant stepwise decrease particularly over the past 1 week. He describes her as not eating or moving much, some selective interaction but decreased verbal interaction. At this time I will recommend the discontinuation of loxapine with close monitoring with her treating physician.    Charlesetta Shanks, MD 12/18/14 [phone]

## 2014-12-18 NOTE — ED Notes (Addendum)
Per ems pt is from [facility]. Pt does not talk much. Denies pain. Hx of dementia. Family reports alert per norm, but more lethargic, in ED 1 week ago for UTI, discharged to [facility]. Since then pt has not really recovered. Just "more lethargic than usual". md at facility saw pt and noted "signs of parkinsons". Ordered a neuro consult for next week. Family was not happy with time frame.

## 2014-12-18 NOTE — ED Notes (Signed)
MD at bedside. 

## 2014-12-18 NOTE — ED Notes (Signed)
Bed: PZ96 Expected date:  Expected time:  Means of arrival:  Comments: EMS- 81yo F, lethargic/recent UTI

## 2014-12-19 LAB — URINE CULTURE: Culture: NO GROWTH

## 2014-12-25 ENCOUNTER — Non-Acute Institutional Stay (SKILLED_NURSING_FACILITY): Payer: Medicare Other | Admitting: Internal Medicine

## 2014-12-25 ENCOUNTER — Encounter: Payer: Self-pay | Admitting: Internal Medicine

## 2014-12-25 DIAGNOSIS — R29818 Other symptoms and signs involving the nervous system: Secondary | ICD-10-CM | POA: Insufficient documentation

## 2014-12-25 DIAGNOSIS — R4182 Altered mental status, unspecified: Secondary | ICD-10-CM

## 2014-12-25 DIAGNOSIS — D72829 Elevated white blood cell count, unspecified: Secondary | ICD-10-CM

## 2014-12-25 DIAGNOSIS — R259 Unspecified abnormal involuntary movements: Secondary | ICD-10-CM | POA: Diagnosis not present

## 2014-12-25 DIAGNOSIS — I10 Essential (primary) hypertension: Secondary | ICD-10-CM | POA: Diagnosis not present

## 2014-12-25 NOTE — Progress Notes (Signed)
Patient ID: Emma Mcguire, female   DOB: 16-Feb-1928, 81 y.o.   MRN: [id number]   MRN: [id number] Name: Emma Mcguire  Sex: female Age: 81 y.o. DOB: 1928/02/26  [place] #: [name] Facility/Room:505 Level Of Care: SNF Provider: Wille Celeste Emergency Contacts: Extended Emergency Contact Information Primary Emergency Contact: Kohlmann,James Address: [address] [place], [place] [address] [place] Phone: [phone] Relation: Son  Code Status:   Allergies: Cymbalta; Lipitor; Metformin and related; Simvastatin; and Verapamil  Chief Complaint  Patient presents with  . Acute Visit  acute visit follow-up increased confusion-change in cognition and increased weakness  HPI: Patient is 81 y.o. female who was recently admitted at [facility] for psychosis last month and was placed at assisted living facility in [place] recently was found to have generalized weakness. She was found to have ARF, and admitted from 9/15-19 for continued work-up and treatment. Pt's course was complicated by elevated troponin, eventually felt not to be cardiac and acute UTI. Pt wasadmitted to SNF for continued generalized weakness for OT/PT. While at SNF   followed for HTN tx with metoprolol, GERD, tx with protonix, and depression with psychosis tx with lexapro and loxapine.-- I spoke with her son he was concerned about what he thought was declining status continued weakness and decreased cognition and lethargy-lab work was ordered which appeared to be relatively unremarkable urine culture also appear to be unremarkable-. I recently spoke with her son was quite concerned about her continued weakness in cognition difficulties-she was sent to the hospital were ER workup did not really show any acute process it was thought that possibly this could be partially related to her medication loxapine and suggestion was made to possibly discontinue this- CT of the head did not show  any acute process lab work was unremarkable except for mildly elevated white count of 11.6 urinalysis indicated no significant growth.  She has returned to the facility and continues to have decreased cognition and some weakness-neurology consult is pending for concerns about possible Parkinson's disease.  Her son is wondering about possibly a hospice consult.  I did discuss this with nursing staff as well and with her situation I suspect a palliative care consult might be a better option in her son is in agreement with this     Past Medical History  Diagnosis Date  . Hypertension   . Alzheimer disease   . Fibromyalgia   . Abnormal heart rhythm   . Colon polyp   . GERD (gastroesophageal reflux disease)   . Diabetes mellitus without complication ([place])   . Neuropathy ([place])   . HOCM (hypertrophic obstructive cardiomyopathy) ([place])   . Aortic stenosis     Past Surgical History  Procedure Laterality Date  . Shoulder surgery        Medication List       This list is accurate as of: 12/25/14 11:59 PM.  Always use your most recent med list.               aspirin 81 MG chewable tablet  Chew 81 mg by mouth daily.     bisacodyl 10 MG suppository  Commonly known as:  DULCOLAX  Place 10 mg rectally daily as needed for mild constipation or moderate constipation.     cefdinir 300 MG capsule  Commonly known as:  OMNICEF  Take 1 capsule (300 mg total) by mouth 2 (two) times daily.  docusate sodium 100 MG capsule  Commonly known as:  COLACE  Take 1 capsule (100 mg total) by mouth daily.     escitalopram 5 MG tablet  Commonly known as:  LEXAPRO  Take 5 mg by mouth daily.     fluticasone 50 MCG/ACT nasal spray  Commonly known as:  FLONASE  Place 2 sprays into both nostrils daily.     glimepiride 2 MG tablet  Commonly known as:  AMARYL  Take 2 mg by mouth daily.     GLUCERNA Liqd  Take 237 mLs by mouth daily as needed (if patient refuses meal.).     magnesium  hydroxide 400 MG/5ML suspension  Commonly known as:  MILK OF MAGNESIA  Take 30 mLs by mouth daily as needed for mild constipation.     metoprolol succinate 50 MG 24 hr tablet  Commonly known as:  TOPROL-XL  Take 75 mg by mouth daily. Take with or immediately following a meal.     pantoprazole 20 MG tablet  Commonly known as:  PROTONIX  Take 20 mg by mouth daily.     THERA Tabs  Take 1 tablet by mouth daily.     vitamin B-12 1000 MCG tablet  Commonly known as:  CYANOCOBALAMIN  Take 1,000 mcg by mouth daily.        No orders of the defined types were placed in this encounter.     There is no immunization history on file for this patient.  Social History  Substance Use Topics  . Smoking status: Never Smoker   . Smokeless tobacco: Not on file  . Alcohol Use: No    Family history is +stroke, DM   Review of Systems  DATA OBTAINED: from patient, nurse  son, medical record GENERAL:  no fevers--has, fatigue, appetite changes SKIN: No itching, rash or wounds EYES: No eye pain, redness, discharge EARS: No earache, tinnitus, change in hearing NOSE: No congestion, drainage or bleeding  MOUTH/THROAT: No mouth or tooth pain, No sore throat RESPIRATORY: No cough, wheezing, SOB CARDIAC: No chest pain, palpitations,  mild lower extremity edema  GI: No abdominal pain, No N/V/D or constipation, No heartburn or reflux  GU: No dysuria, frequency or urgency, or incontinence  MUSCULOSKELETAL: No unrelieved bone/joint pain continues to have weakness her son feels this is progressing NEUROLOGIC: No headache, dizziness or focal weakness stiffness question parkinsonian symptoms present PSYCHIATRIC: No c/o anxiety or sadness--her son feels she's had a declining cognition and some increased confusion which he feels is not improving --  Filed Vitals:   12/25/14 1650  BP: 170/90  Pulse: 96  Resp: 18    SpO2 Readings from Last 1 Encounters:  12/18/14 94%        Physical  Exam  GENERAL APPEARANCE: Alert,but non- conversant,  WF,No acute distress appears quite frail.  SKIN: No diaphoresis rash HEAD: Normocephalic, atraumatic  EYES: Conjunctiva/lids clear. Pupils round, reactive. EOMs intact.  EARS: External exam WNL, canals clear. Hearing grossly normal.  NOSE: No deformity or discharge.  MOUTH/THROAT: oropharynx is clear RESPIRATORY: Breathing is even, unlabored. Lung sounds are clear air entry is shallow  CARDIOVASCULAR: Heart RRR 2/6 systolic murmur, no rubs or gallops. Mild  peripheral edema.   GASTROINTESTINAL: Abdomen is --soft, non-tender, not distended w/ normal bowel sounds. GENITOURINARY:, not distended--does not appear to have overt suprapubic tenderness  MUSCULOSKELETAL: No abnormal joints or musculature grip strength appears preserved upper extremities has some lower extremity weakness but is able to move her legs bilaterally--  quite frail and weak however appears to have somewhat bradycardic movements questionable cogwheeling upper extremities bilaterally slight tremor left leg NEUROLOGIC:  Cranial nerves 2-12 grossly intact. Moves all extremities--she does have somewhat slow movements however-- questionable cogwheeling  upper extremities-- her movement  is somewhat slow with what appears to be rigidity there is a slight tremor of her left leg at times --somewhat of a masked facies PSYCHIATRIC:  Is nonconversant currently today about apparently will converse at times with nursing staff and her family-  Patient Active Problem List   Diagnosis Date Noted  . Parkinsonian features 12/25/2014  . Weakness 12/18/2014  . Altered mental status 12/11/2014  . UTI (urinary tract infection) 12/02/2014  . GERD (gastroesophageal reflux disease) 12/02/2014  . Psychosis 12/02/2014  . Renal failure (ARF), acute on chronic (HCC) 11/24/2014  . Hypertension 11/24/2014  . Diabetes mellitus type 2, controlled ([place]) 11/24/2014  . MDD (major depressive disorder)  ([place]) 11/24/2014  . Pressure ulcer 11/24/2014  . Elevated troponin 11/23/2014     Labs.  12/18/2014.  Sodium 141 potassium 4 BUN 23 creatinine 0.98.  Albumin 3.2 otherwise liver function tests within normal limits.  WBC 11.6 hemoglobin 14.2 platelets 209  12/12/2014.  WBC 7.4 hemoglobin 12.6 platelets 294.  Sodium 143 potassium 4 BUN 17 creatinine 1.0.  Assessment and plan.  #1-continued weakness and lethargy and altered mental status- This continues to be a challenging situation I did discuss this with her son again today-he would like either hospice or palliative care consult-we will order a palliative care consult her son is in agreement with this-also will order a psychiatric consult to consider discontinuing the loxapine I would be more comfortable having psychiatric services looked at this and her son is okay with this as well.  A neurology consult also is pending.  I did note there was a mildly elevated white count on ER visit will order a CBC with differential and metabolic panel tomorrow again her urine culture and chest x-ray in the ER were not concerning.  Addendum.  Later in the day nursing staff did note me that her systolic blood pressure was significantly elevated above 200-I'd confirm this by manual reading N did order clonidine 0.1 mg and recheck her blood pressure N1 and N2 hours and notify provider if systolic is still elevated above 431 or diastolic above the higher 90s-I also will increase her Toprol to 75 mg daily and appears her pulses are borderline high and she will tolerate the increased dose of beta blocker but this will have to be watched with vital signs and blood pressure checks closely.  I did reevaluate her she did not show any signs of increased neurologic deficits it was essentially at baseline exam with previous exam against this will have to be monitored closely.  [id number] note greater than 40 minutes spent assessing patient-reassessing  patient-discussing her status with nursing staff-as well as her son-reviewing her chart-and coordinating and formulating a plan of care-of note greater than 50% of time spent coordinating plan of care                  ,

## 2014-12-28 ENCOUNTER — Non-Acute Institutional Stay (SKILLED_NURSING_FACILITY): Payer: Medicare Other | Admitting: Internal Medicine

## 2014-12-28 DIAGNOSIS — R609 Edema, unspecified: Secondary | ICD-10-CM

## 2014-12-28 DIAGNOSIS — I10 Essential (primary) hypertension: Secondary | ICD-10-CM

## 2014-12-28 NOTE — Progress Notes (Signed)
Patient ID: Emma Mcguire, female   DOB: 02/05/1928, 81 y.o.   MRN: [id number]    MRN: [id number] Name: Emma Mcguire  Sex: female Age: 81 y.o. DOB: 1927-08-04  [place] [facility]: [name] Facility/Room:505 Level Of Care: SNF Provider: Wille Celeste Emergency Contacts: Extended Emergency Contact Information Primary Emergency Contact: Corrales,James Address: [address] [place], [place] [address] [place] Phone: [phone] Relation: Son Secondary Emergency Contact: Rudell Cobb [place] Phone: [phone] Relation: Other  Code Status:   Allergies: Cymbalta; Lipitor; Metformin and related; Simvastatin; and Verapamil  Chief Complaint  Patient presents with  . Acute Visit  acute visit --secondary to elevated blood pressure  HPI: Patient is 81 y.o. female who was recently admitted at [facility] for psychosis last month and was placed at assisted living facility in [place] recently was found to have generalized weakness. She was found to have ARF, and admitted from 9/15-19 for continued work-up and treatment. Pt's course was complicated by elevated troponin, eventually felt not to be cardiac and acute UTI. Pt wasadmitted to SNF for continued generalized weakness for OT/PT. While at SNF   followed for HTN tx with metoprolol, GERD, tx with protonix, and depression with psychosis tx with lexapro and loxapine.-- I spoke with her son he was concerned about what he thought was declining status continued weakness and decreased cognition and lethargy-lab work was ordered which appeared to be relatively unremarkable urine culture also appear to be unremarkable-. I recently spoke with her son was quite concerned about her continued weakness in cognition difficulties-she was sent to the hospital were ER workup did not really show any acute process it was thought that possibly this could be partially related to her medication loxapine and  suggestion was made to possibly discontinue this- CT of the head did not show any acute process lab work was unremarkable except for mildly elevated white count of 11.6 urinalysis indicated no significant growth.  She has returned to the facility and continues to have decreased cognition and some weakness-neurology consult is pending for concerns about possible Parkinson's disease. Patient has had elevated blood pressure at times recently although this does not appear to be consistent she has received clonidine in the past with good effect-this appears to be the case again the night her systolic is too 10 diastolic is slightly over 100 she does not show any changes in neurologic symptoms from baseline-she has on Toprol this was recently increased to 75 mg a day her pulse is 76.  Other recent blood pressures 133/72-122/51.  Nursing staff also is noted some edema to her left arm and leg this apparently is nonerythematous nontender an incidental finding          Past Medical History  Diagnosis Date  . Hypertension   . Alzheimer disease   . Fibromyalgia   . Abnormal heart rhythm     a fib  . Colon polyp   . GERD (gastroesophageal reflux disease)   . Diabetes mellitus without complication ([place])   . Neuropathy ([place])   . HOCM (hypertrophic obstructive cardiomyopathy) ([place])   . Aortic stenosis   . Chronic kidney disease     Past Surgical History  Procedure Laterality Date  . Shoulder surgery Left     rotator cuff      Medication List       This list is accurate as of: 12/28/14 11:59 PM.  Always use your most recent med list.               aspirin 81 MG chewable tablet  Chew 81 mg by mouth daily.     bisacodyl 10 MG suppository  Commonly known as:  DULCOLAX  Place 10 mg rectally daily as needed for mild constipation or moderate constipation.     cefdinir 300 MG capsule  Commonly known as:  OMNICEF  Take 1 capsule (300 mg total) by mouth 2 (two) times daily.      docusate sodium 100 MG capsule  Commonly known as:  COLACE  Take 1 capsule (100 mg total) by mouth daily.     escitalopram 5 MG tablet  Commonly known as:  LEXAPRO  Take 10 mg by mouth daily.     fluticasone 50 MCG/ACT nasal spray  Commonly known as:  FLONASE  Place 2 sprays into both nostrils daily.     glimepiride 2 MG tablet  Commonly known as:  AMARYL  Take 2 mg by mouth daily.     GLUCERNA Liqd  Take 237 mLs by mouth daily as needed (if patient refuses meal.).     magnesium hydroxide 400 MG/5ML suspension  Commonly known as:  MILK OF MAGNESIA  Take 30 mLs by mouth daily as needed for mild constipation.     metoprolol succinate 50 MG 24 hr tablet  Commonly known as:  TOPROL-XL  Take 75 mg by mouth daily. Take with or immediately following a meal.     pantoprazole 20 MG tablet  Commonly known as:  PROTONIX  Take 20 mg by mouth daily.     THERA Tabs  Take 1 tablet by mouth daily.     vitamin B-12 1000 MCG tablet  Commonly known as:  CYANOCOBALAMIN  Take 1,000 mcg by mouth daily.        No orders of the defined types were placed in this encounter.     There is no immunization history on file for this patient.  Social History  Substance Use Topics  . Smoking status: Never Smoker   . Smokeless tobacco: Not on file  . Alcohol Use: No    Family history is +stroke, DM   Review of Systems  DATA OBTAINED: from  nurse , medical record GENERAL:  no fevers--has, fatigue, appetite changes SKIN: No itching, rash or wounds EYES: No eye pain, redness, discharge EARS: No earache, tinnitus, change in hearing NOSE: No congestion, drainage or bleeding  MOUTH/THROAT: No mouth or tooth pain, No sore throat RESPIRATORY: No cough, wheezing, SOB CARDIAC: No chest pain, palpitations,  mild lower extremity edema more on the left and also some left upper extremity edema GI: No abdominal pain, No N/V/D or constipation, No heartburn or reflux  GU: No dysuria, frequency or  urgency, or incontinence  MUSCULOSKELETAL: No unrelieved bone/joint pain continues to have weakness her son feels this is progressing NEUROLOGIC: No headache, dizziness or focal weakness stiffness question parkinsonian symptoms present PSYCHIATRIC: No c/o anxiety or sadness--her son feels she's had a declining cognition and some increased confusion which he feels is not improving --  There were no vitals filed for this visit.  SpO2 Readings from Last 1 Encounters:  12/18/14 94%    She is afebrile pulse 76 respirations 18 blood pressure 210/116    Physical Exam  GENERAL APPEARANCE: Alert,but non- conversant,  WF,No acute distress appears quite frail.  SKIN: No diaphoresis rash HEAD: Normocephalic, atraumatic  EYES: Conjunctiva/lids clear. Pupils round,  reactive. EOMs intact.  EARS: External exam WNL, canals clear. Hearing grossly normal.  NOSE: No deformity or discharge.  MOUTH/THROAT: oropharynx is clear, is midline RESPIRATORY: Breathing is even, unlabored. Lung sounds are clear air entry is shallow  CARDIOVASCULAR: Heart RRR 2/6 systolic murmur, no rubs or gallops. Mild  peripheral edema. Noted on the left upper and lower extremities radial pulse and pedal pulse is intact-edema is nontender non-erythematous cool to touch   GASTROINTESTINAL: Abdomen is --soft, non-tender, not distended w/ normal bowel sounds. GENITOURINARY:, not distended--does not appear to have overt suprapubic tenderness  MUSCULOSKELETAL: No abnormal joints or musculature grip strength appears preserved upper extremities has some lower extremity weakness but is able to move her legs bilaterally-- quite frail and weak however appears to have somewhat bradycardic movements questionable cogwheeling upper extremities bilaterally slight tremor left leg NEUROLOGIC:  Cranial nerves 2-12 grossly intact. Moves all extremities--she does have somewhat slow movements however-- questionable cogwheeling  upper extremities-- her  movement  is somewhat slow with what appears to be rigidity there is a slight tremor of her left leg at times --somewhat of a masked facies--is able to open her mouth and moves tongue with full range of motion I neurologic I do not see any changes from baseline PSYCHIATRIC:  Is nonconversant currently today about apparently will converse at times with nursing staff and her family-  Patient Active Problem List   Diagnosis Date Noted  . Edema 01/06/2015  . Blister of skin without infection 01/05/2015  . Parkinsonian features 12/25/2014  . Weakness 12/18/2014  . Altered mental status 12/11/2014  . UTI (urinary tract infection) 12/02/2014  . GERD (gastroesophageal reflux disease) 12/02/2014  . Psychosis 12/02/2014  . Renal failure (ARF), acute on chronic (HCC) 11/24/2014  . Hypertension 11/24/2014  . Diabetes mellitus type 2, controlled ([place]) 11/24/2014  . MDD (major depressive disorder) ([place]) 11/24/2014  . Pressure ulcer 11/24/2014  . Elevated troponin 11/23/2014     Labs.  12/18/2014.  Sodium 141 potassium 4 BUN 23 creatinine 0.98.  Albumin 3.2 otherwise liver function tests within normal limits.  WBC 11.6 hemoglobin 14.2 platelets 209  12/12/2014.  WBC 7.4 hemoglobin 12.6 platelets 294.  Sodium 143 potassium 4 BUN 17 creatinine 1.0.  Assessment and plan.  Hypertension-this appears to be intermittent previous blood pressures at times appear to be within normal range but she does spike at times will give her clonidine 0.1 mg now recheck her blood pressure and one hour and in 2 hours notify provider if systolic is over 468 or diastolic greater than [id number] has responded well to clonidine when necessary in the past-continue to monitor vital signs every 4 hours for now.  Also will make clonidine when necessary 0.1 mg a systolic greater than 248 or diastolic greater than 98.  #2 history of left upper and lower extremity edema this could be dependent related will order venous  Doppler however to rule out any possibility of DVT-clinically she does not appear to be uncomfortable here.  [id number]                 ,

## 2014-12-29 DIAGNOSIS — R404 Transient alteration of awareness: Secondary | ICD-10-CM | POA: Diagnosis not present

## 2014-12-31 ENCOUNTER — Encounter: Payer: Self-pay | Admitting: Internal Medicine

## 2015-01-01 ENCOUNTER — Non-Acute Institutional Stay (SKILLED_NURSING_FACILITY): Payer: Medicare Other | Admitting: Internal Medicine

## 2015-01-01 DIAGNOSIS — R259 Unspecified abnormal involuntary movements: Secondary | ICD-10-CM

## 2015-01-01 DIAGNOSIS — N179 Acute kidney failure, unspecified: Secondary | ICD-10-CM | POA: Diagnosis not present

## 2015-01-01 DIAGNOSIS — I10 Essential (primary) hypertension: Secondary | ICD-10-CM

## 2015-01-01 DIAGNOSIS — L988 Other specified disorders of the skin and subcutaneous tissue: Secondary | ICD-10-CM | POA: Diagnosis not present

## 2015-01-01 DIAGNOSIS — R531 Weakness: Secondary | ICD-10-CM

## 2015-01-01 DIAGNOSIS — N189 Chronic kidney disease, unspecified: Secondary | ICD-10-CM

## 2015-01-01 NOTE — Progress Notes (Signed)
Patient ID: Emma Mcguire, female   DOB: Sep 18, 1927, 81 y.o.   MRN: [id number]     MRN: [id number] Name: Emma Mcguire  Sex: female Age: 81 y.o. DOB: 1927/08/07  [place] [name] Facility/Room:505 Level Of Care: SNF Provider: Wille Celeste Emergency Contacts: Extended Emergency Contact Information Primary Emergency Contact: Propst,James Address: [address] [place], [place] [address] [place] Phone: [phone] Relation: Son Secondary Emergency Contact: Rudell Cobb [place] of [place] Phone: [phone] Relation: Other  Code Status:   Allergies: Cymbalta; Lipitor; Metformin and related; Simvastatin; and Verapamil  Chief Complaint  Patient presents with  . Acute Visit  acute visit --follow-up hypertension-weakness-  HPI: Patient is 81 y.o. female who was recently admitted at [facility] for psychosis last month and was placed at assisted living facility in [place] recently was found to have generalized weakness. She was found to have ARF, and admitted from 9/15-19 for continued work-up and treatment. Pt's course was complicated by elevated troponin, eventually felt not to be cardiac and acute UTI. Pt wasadmitted to SNF for continued generalized weakness for OT/PT. While at SNF   followed for HTN tx with metoprolol, GERD, tx with protonix, and depression with psychosis tx with lexapro and loxapine.--  . She has had significant weakness. And has exhibited what appears to be Parkinsonian  features-she actuallywent to the ER for workup and there was suggestion to discontinue her Loxapine  we are waiting psychiatric input here about possibly titrating this down  She is also had elevated blood pressures at times this appears to be intermittent she has received clonidine at times-reviewing her blood pressures today there is some variability I see ranging from 126/60-140/96-155/83-she does received clonidine with good effect  when she does have spikes systolically over 378 low this does not appear to be consistent.  She is on Toprol this is recently increased to 75 mg a day appears to be helping some although this will have to be watched.  She does have a history of acute renal failure on top of chronic renal insufficiency she is apparently eating and drinking with encouragement although she does not talk much at all when I am the room-we will need to update her labs.             Past Medical History  Diagnosis Date  . Hypertension   . Alzheimer disease   . Fibromyalgia   . Abnormal heart rhythm     a fib  . Colon polyp   . GERD (gastroesophageal reflux disease)   . Diabetes mellitus without complication ([place])   . Neuropathy ([place])   . HOCM (hypertrophic obstructive cardiomyopathy) ([place])   . Aortic stenosis   . Chronic kidney disease     Past Surgical History  Procedure Laterality Date  . Shoulder surgery Left     rotator cuff      Medication List       This list is accurate as of: 01/01/15 11:59 PM.  Always use your most recent med list.               aspirin 81 MG chewable tablet  Chew 81 mg by mouth daily.     bisacodyl 10 MG suppository  Commonly known as:  DULCOLAX  Place 10 mg rectally daily as needed for mild constipation or moderate constipation.     cefdinir 300 MG capsule  Commonly known as:  OMNICEF  Take 1 capsule (300 mg total) by mouth 2 (two) times daily.     docusate sodium 100 MG capsule  Commonly known as:  COLACE  Take 1 capsule (100 mg total) by mouth daily.     escitalopram 5 MG tablet  Commonly known as:  LEXAPRO  Take 10 mg by mouth daily.     fluticasone 50 MCG/ACT nasal spray  Commonly known as:  FLONASE  Place 2 sprays into both nostrils daily.     glimepiride 2 MG tablet  Commonly known as:  AMARYL  Take 2 mg by mouth daily.     GLUCERNA Liqd  Take 237 mLs by mouth daily as needed (if patient refuses meal.).     magnesium hydroxide  400 MG/5ML suspension  Commonly known as:  MILK OF MAGNESIA  Take 30 mLs by mouth daily as needed for mild constipation.     metoprolol succinate 50 MG 24 hr tablet  Commonly known as:  TOPROL-XL  Take 75 mg by mouth daily. Take with or immediately following a meal.     pantoprazole 20 MG tablet  Commonly known as:  PROTONIX  Take 20 mg by mouth daily.     THERA Tabs  Take 1 tablet by mouth daily.     vitamin B-12 1000 MCG tablet  Commonly known as:  CYANOCOBALAMIN  Take 1,000 mcg by mouth daily.        No orders of the defined types were placed in this encounter.     There is no immunization history on file for this patient.  Social History  Substance Use Topics  . Smoking status: Never Smoker   . Smokeless tobacco: Not on file  . Alcohol Use: No    Family history is +stroke, DM   Review of Systems  DATA OBTAINED: from  nurse , medical record GENERAL:  no fevers--has, fatigue, appetite changes SKIN: No itching, rash or wounds EYES: No eye pain, redness, discharge EARS: No earache, tinnitus, change in hearing NOSE: No congestion, drainage or bleeding  MOUTH/THROAT: No mouth or tooth pain, No sore throat RESPIRATORY: No cough, wheezing, SOB CARDIAC: No chest pain, palpitations,  left side upper and lower extremity edema appears to be somewhat better today venous Dopplers were negative for any DVT GI: No abdominal pain, No N/V/D or constipation, No heartburn or reflux  GU: No dysuria, frequency or urgency, or incontinence  MUSCULOSKELETAL: No unrelieved bone/joint pain continues to have weakness her son feels this is progressing NEUROLOGIC: No headache, dizziness or focal weakness stiffness question parkinsonian symptoms present PSYCHIATRIC: No c/o anxiety or sadness--her son feels she's had a declining cognition and some increased confusion which he feels is not improving --  Filed Vitals:   01/07/15 1430  BP: 155/83  Pulse: 92  Temp: 97.8 F (36.6 C)  Resp:  18    SpO2 Readings from Last 1 Encounters:  12/18/14 94%    She is afebrile pulse 76 respirations 18 blood pressure 210/116    Physical Exam  GENERAL APPEARANCE: Alert,but non- conversant,  WF,No acute distress appears quite frail.  SKIN: No diaphoresis rash HEAD: Normocephalic, atraumatic  EYES: Conjunctiva/lids clear. Pupils round, reactive. EOMs intact.  EARS: External exam WNL, canals clear. Hearing grossly normal.  NOSE: No deformity or discharge.  MOUTH/THROAT: oropharynx is clear, is midline RESPIRATORY: Breathing is even, unlabored. Lung sounds are clear air entry is shallow  CARDIOVASCULAR: Heart RRR 2/6 systolic murmur, no rubs or gallops. Mild  peripheral edema.  GASTROINTESTINAL: Abdomen is --soft, non-tender, not distended w/ normal bowel sounds. GENITOURINARY:, not distended--does not appear to have overt suprapubic tenderness  MUSCULOSKELETAL: No abnormal joints or musculature grip strength appears preserved upper extremities has some lower extremity weakness but is able to move her legs bilaterally-- quite frail and weak however appears to have somewhat bradycardic movements questionable cogwheeling upper extremities bilaterally slight tremor left leg NEUROLOGIC:  Cranial nerves 2-12 grossly intact. Moves all extremities--she does have somewhat slow movements however-- questionable cogwheeling  upper extremities-- her movement  is somewhat slow with what appears to be rigidity-- --somewhat of a masked facies--is able to open her mouth and moves tongue with full range of motion I neurologic I do not see any changes from baseline PSYCHIATRIC:  Is nonconversant currently today about apparently will converse at times with nursing staff and her family-she is following verbal commands  Patient Active Problem List   Diagnosis Date Noted  . HTN (hypertension) 01/07/2015  . Edema 01/06/2015  . Blister of skin without infection 01/05/2015  . Parkinsonian features 12/25/2014   . Weakness 12/18/2014  . Altered mental status 12/11/2014  . UTI (urinary tract infection) 12/02/2014  . GERD (gastroesophageal reflux disease) 12/02/2014  . Psychosis 12/02/2014  . Renal failure (ARF), acute on chronic (HCC) 11/24/2014  . Hypertension 11/24/2014  . Diabetes mellitus type 2, controlled ([place]) 11/24/2014  . MDD (major depressive disorder) ([place]) 11/24/2014  . Pressure ulcer 11/24/2014  . Elevated troponin 11/23/2014     Labs.  12/18/2014.  Sodium 141 potassium 4 BUN 23 creatinine 0.98.  Albumin 3.2 otherwise liver function tests within normal limits.  WBC 11.6 hemoglobin 14.2 platelets 209  12/12/2014.  WBC 7.4 hemoglobin 12.6 platelets 294.  Sodium 143 potassium 4 BUN 17 creatinine 1.0.  Assessment and plan.  Hypertension-this appears to be intermittent --her Toprol has been increased this appears to be helping some this will have to be watched however recent blood pressures appear to be stabilizing although this is somewhat hard to predict she does have spikes   Also has clonidine when necessary 0.1 mg a systolic greater than 962 or diastolic greater than 98.  #2 history of left upper and lower extremity edema this could be dependent related  Venous Dopplers have been negative.  #3 history of renal insufficiency this actually is been stable but will need to update her lab continue to encourage by mouth intake also will update a CBC and TSH.  #4-weakness-as noted psych consult is pending as well as a neurology consult-there has been recommendation to discontinue or titrate down her loxapine would be more comfortable having this addressed by the psychiatric nurse practitioner and family understands this  [phone]                 ,

## 2015-01-02 ENCOUNTER — Encounter: Payer: Self-pay | Admitting: *Deleted

## 2015-01-02 DIAGNOSIS — F29 Unspecified psychosis not due to a substance or known physiological condition: Secondary | ICD-10-CM | POA: Diagnosis not present

## 2015-01-02 DIAGNOSIS — I10 Essential (primary) hypertension: Secondary | ICD-10-CM | POA: Diagnosis not present

## 2015-01-02 DIAGNOSIS — F329 Major depressive disorder, single episode, unspecified: Secondary | ICD-10-CM | POA: Diagnosis not present

## 2015-01-03 ENCOUNTER — Non-Acute Institutional Stay (SKILLED_NURSING_FACILITY): Payer: Medicare Other | Admitting: Internal Medicine

## 2015-01-03 ENCOUNTER — Ambulatory Visit (INDEPENDENT_AMBULATORY_CARE_PROVIDER_SITE_OTHER): Payer: Medicare Other | Admitting: Diagnostic Neuroimaging

## 2015-01-03 ENCOUNTER — Encounter: Payer: Self-pay | Admitting: Internal Medicine

## 2015-01-03 ENCOUNTER — Encounter: Payer: Self-pay | Admitting: Diagnostic Neuroimaging

## 2015-01-03 VITALS — BP 147/99 | HR 93

## 2015-01-03 DIAGNOSIS — R531 Weakness: Secondary | ICD-10-CM | POA: Diagnosis not present

## 2015-01-03 DIAGNOSIS — R259 Unspecified abnormal involuntary movements: Secondary | ICD-10-CM

## 2015-01-03 DIAGNOSIS — I10 Essential (primary) hypertension: Secondary | ICD-10-CM

## 2015-01-03 DIAGNOSIS — G2119 Other drug induced secondary parkinsonism: Secondary | ICD-10-CM

## 2015-01-03 DIAGNOSIS — G219 Secondary parkinsonism, unspecified: Secondary | ICD-10-CM | POA: Diagnosis not present

## 2015-01-03 NOTE — Progress Notes (Signed)
[facility]  PATIENT: Emma Mcguire DOB: Aug 01, 1927  REFERRING CLINICIAN: Alexander HISTORY FROM: patient and daughter in law REASON FOR VISIT: new consult    HISTORICAL  CHIEF COMPLAINT:  Chief Complaint  Patient presents with  . Referral for questionable Parkinson's    rm 6, New Patient, dgtr-in-law- Diana    HISTORY OF PRESENT ILLNESS:   81 year old female here for evaluation of gait difficulty and falls. August 2016 patient was living alone, fell and hit her head. She was having significant depression and suicidal thoughts. She was then admitted for psychiatry admission. On September 1 she was discharged to [facility] skilled nursing facility. During this timeframe patient was started on antipsychotic medication loxapine and Lexapro.  Over time patient has had significant psychomotor slowing, stooped posture, slow short stepped gait, tremor. Therefore patient was referred to me for evaluation of possible parkinsonism.  Apparently other medical providers have noticed the symptoms and several recommendations to wean off of loxapine were made as this may have caused drug induced parkinsonism. Patient is being managed by psychiatry and they are planning to reduce this medication.   REVIEW OF SYSTEMS: Full 14 system review of systems performed and notable only for confusion weakness tremor decreased energy change in appetite discharge activity suicidal thoughts hallucinations sauce be head droop incontinence weight loss.&  ALLERGIES: Allergies  Allergen Reactions  . Cymbalta [Duloxetine Hcl] Other (See Comments)    Listed on MAR  . Lipitor [Atorvastatin] Other (See Comments)    Listed on MAR  . Metformin And Related Other (See Comments)    Listed on MAR  . Simvastatin Other (See Comments)    Listed on MAR  . Verapamil     Sinus arrest    HOME MEDICATIONS: Prior to Admission medications   Medication Sig Start Date End Date Taking? Authorizing  Provider  aspirin 81 MG chewable tablet Chew 81 mg by mouth daily. 11/09/14 11/09/15 Yes Historical Provider, MD  bisacodyl (DULCOLAX) 10 MG suppository Place 10 mg rectally daily as needed for mild constipation or moderate constipation.   Yes Historical Provider, MD  cloNIDine (CATAPRES) 0.1 MG tablet Take 0.1 mg by mouth 2 (two) times daily.   Yes Historical Provider, MD  docusate sodium (COLACE) 100 MG capsule Take 1 capsule (100 mg total) by mouth daily. 11/27/14  Yes Reyne Dumas, MD  escitalopram (LEXAPRO) 5 MG tablet Take 10 mg by mouth daily.  11/09/14 02/07/15 Yes Historical Provider, MD  fluticasone (FLONASE) 50 MCG/ACT nasal spray Place 2 sprays into both nostrils daily.   Yes Historical Provider, MD  GLUCERNA (GLUCERNA) LIQD Take 237 mLs by mouth daily as needed (if patient refuses meal.).   Yes Historical Provider, MD  loxapine (LOXITANE) 25 MG capsule Take 25 mg by mouth at bedtime.   Yes Historical Provider, MD  magnesium hydroxide (MILK OF MAGNESIA) 400 MG/5ML suspension Take 30 mLs by mouth daily as needed for mild constipation.   Yes Historical Provider, MD  metoprolol succinate (TOPROL-XL) 50 MG 24 hr tablet Take 75 mg by mouth daily. Take with or immediately following a meal.   Yes Historical Provider, MD  Multiple Vitamin (THERA) TABS Take 1 tablet by mouth daily. 11/09/14 11/09/15 Yes Historical Provider, MD  pantoprazole (PROTONIX) 20 MG tablet Take 20 mg by mouth daily.   Yes Historical Provider, MD  vitamin B-12 (CYANOCOBALAMIN) 1000 MCG tablet Take 1,000 mcg by mouth daily. 11/09/14 11/09/15 Yes Historical Provider, MD  cefdinir (OMNICEF) 300 MG capsule Take 1  capsule (300 mg total) by mouth 2 (two) times daily. Patient not taking: Reported on 01/03/2015 11/27/14   Reyne Dumas, MD  glimepiride (AMARYL) 2 MG tablet Take 2 mg by mouth daily. 11/09/14 11/09/15  Historical Provider, MD  Metoprolol Succinate (TOPROL XL PO) Take 75 mg by mouth daily. Toprol XL 25 mg + XL 50 mg    Historical  Provider, MD  Metoprolol Tartrate 75 MG TABS Take by mouth.    Historical Provider, MD     PAST MEDICAL HISTORY: Past Medical History  Diagnosis Date  . Hypertension   . Alzheimer disease   . Fibromyalgia   . Abnormal heart rhythm     a fib  . Colon polyp   . GERD (gastroesophageal reflux disease)   . Diabetes mellitus without complication ([place])   . Neuropathy ([place])   . HOCM (hypertrophic obstructive cardiomyopathy) ([place])   . Aortic stenosis   . Chronic kidney disease     PAST SURGICAL HISTORY: Past Surgical History  Procedure Laterality Date  . Shoulder surgery Left     rotator cuff    FAMILY HISTORY: Family History  Problem Relation Age of Onset  . Stroke Mother   . Diabetes Mellitus II Maternal Grandmother     SOCIAL HISTORY:  Social History   Social History  . Marital Status: Widowed    Spouse Name: N/A  . Number of Children: 4  . Years of Education: 12   Occupational History  . Not on file.   Social History Main Topics  . Smoking status: Never Smoker   . Smokeless tobacco: Not on file  . Alcohol Use: No  . Drug Use: No  . Sexual Activity: Not on file   Other Topics Concern  . Not on file   Social History Narrative   Lives in SNF, [facility] , widowed, lived at home until 4-5 weeks ago   No caffeine     PHYSICAL EXAM  GENERAL EXAM/CONSTITUTIONAL: Vitals:  Filed Vitals:   01/03/15 1059  BP: 147/99  Pulse: 93     There is no weight on file to calculate BMI.  No exam data present  Patient is in no distress; well developed, nourished and groomed; neck is supple  CARDIOVASCULAR:  Examination of carotid arteries is normal; no carotid bruits  Regular rate and rhythm, no murmurs  Examination of peripheral vascular system by observation and palpation is normal  EYES:  Ophthalmoscopic exam of optic discs and posterior segments is normal; no papilledema or hemorrhages  MUSCULOSKELETAL:  Gait, strength, tone, movements  noted in Neurologic exam below  NEUROLOGIC: MENTAL STATUS:  No flowsheet data found.  EYES CLOSED; BARELY ABLE TO OPEN EYES; ESSENTIALLY NON-VERBAL;   DECR MEMORY  DECR ATTENTION  DECR FLUENCY; DECR COMPREHENSION; DECR NAMING   DECR FUND OF KNOWLEDGE  CRANIAL NERVE:   2nd - no papilledema on fundoscopic exam  2nd, 3rd, 4th, 6th - pupils equal and reactive to light; NOT FOLLOWING COMMANDS FOR VISUAL FIELD OR EYE MOVEMENT TESTING  5th - facial sensation symmetric  7th - facial strength symmetric  8th - hearing intact  9th - uvula testing --> NOT FOLLOWING COMMANDS  11th - shoulder shrug --> NOT FOLLOWING COMMANDS  12th - tongue protrusion --> NOT FOLLOWING COMMANDS  MOTOR:   INCREASED TONE IN BUE AND BLE; INTERMITTENT REST TREMOR IN BUE  SENSORY:   normal and symmetric to light touch  COORDINATION:   finger-nose-finger, fine finger movements --> SLOW, NOT FOLLOWING COMMANDS  REFLEXES:   deep tendon reflexes TRACE symmetric  GAIT/STATION:   STOOPED POSTURE, RIGID; IN WHEELCHAIR    DIAGNOSTIC DATA (LABS, IMAGING, TESTING) - I reviewed patient records, labs, notes, testing and imaging myself where available.  Lab Results  Component Value Date   WBC 11.6* 12/18/2014   HGB 14.2 12/18/2014   HCT 43.0 12/18/2014   MCV 91.3 12/18/2014   PLT 209 12/18/2014      Component Value Date/Time   NA 141 12/18/2014 1551   K 4.0 12/18/2014 1551   CL 107 12/18/2014 1551   CO2 25 12/18/2014 1551   GLUCOSE 128* 12/18/2014 1551   BUN 23* 12/18/2014 1551   CREATININE 0.98 12/18/2014 1551   CALCIUM 9.9 12/18/2014 1551   PROT 6.5 12/18/2014 1551   ALBUMIN 3.2* 12/18/2014 1551   AST 34 12/18/2014 1551   ALT 25 12/18/2014 1551   ALKPHOS 103 12/18/2014 1551   BILITOT 0.9 12/18/2014 1551   GFRNONAA 51* 12/18/2014 1551   GFRAA 59* 12/18/2014 1551   No results found for: CHOL, HDL, LDLCALC, LDLDIRECT, TRIG, CHOLHDL No results found for: HGBA1C No results found  for: VITAMINB12 No results found for: TSH   12/18/14 CT head (without) [I reviewed images myself and agree with interpretation. -VRP]  1. No acute finding. 2. Mild generalized atrophy. Moderate to advanced chronic microvascular ischemic change.     ASSESSMENT AND PLAN  81 y.o. year old female here with progressive psychomotor slowing and catatonia with parkinsonism symptoms, most likely related to medication side effect of loxapine. We'll check MRI brain to rule out secondary causes. I commend follow-up with psychiatry to see if loxapine dose can be reduced or tapered off and monitor symptoms.   Ddx: drug-induced parkinsonism, vascular parkinsonism, or psychomotor slowing/catatonia due to depression  Drug-induced Parkinson's disease ([place]) - Plan: MR Brain Wo Contrast  Secondary Parkinsonism, unspecified secondary Parkinsonism type ([place]) - Plan: MR Brain Wo Contrast     PLAN: - I will check MRI brain - follow up with psychiatry regarding reducing or stopping loxapine, which has likely caused drug-induced parkinsonism  Orders Placed This Encounter  Procedures  . MR Brain Wo Contrast   Return in about 6 weeks (around 02/14/2015).    Penni Bombard, MD 09/81/1914, 78:29 AM Certified in Neurology, Neurophysiology and Neuroimaging  [facility] [address] [place], [place] [address] [phone]

## 2015-01-03 NOTE — Progress Notes (Signed)
Patient ID: Emma Mcguire, female   DOB: [birth year], 81 y.o.   MRN: [id number]    MRN: [id number] Name: Emma Mcguire  Sex: female Age: 81 y.o. DOB: 06-22-1927  [place] #: [name] Facility/Room:505 Level Of Care: SNF Provider: Wille Celeste Emergency Contacts: Extended Emergency Contact Information Primary Emergency Contact: Spillman,James Address: [address] [place], [place] [address] [place] Phone: [phone] Relation: Son Secondary Emergency Contact: Rudell Cobb [place] Phone: [phone] Relation: Other  Code Status:   Allergies: Cymbalta; Lipitor; Metformin and related; Simvastatin; and Verapamil  Chief Complaint  Patient presents with  . Acute Visit  acute visit follow--change in cognition and increased weakness--status post neurology consult--follow-up hypertension  HPI: Patient is 81 y.o. female who was recently admitted at [facility] for psychosis last month and was placed at assisted living facility in [place] recently was found to have generalized weakness. She was found to have ARF, and admitted from 9/15-19 for continued work-up and treatment. Pt's course was complicated by elevated troponin, eventually felt not to be cardiac and acute UTI. Pt wasadmitted to SNF for continued generalized weakness for OT/PT. While at SNF   followed for HTN tx with metoprolol, GERD, tx with protonix, and depression with psychosis tx with lexapro and loxapine.-- I spoke with her son he was concerned about what he thought was declining status continued weakness and decreased cognition and lethargy-lab work was ordered which appeared to be relatively unremarkable urine culture also appear to be unremarkable-. I recently spoke with her son was quite concerned about her continued weakness in cognition difficulties-she was sent to the hospital were ER workup did not really show any acute process it was thought that  possibly this could be partially related to her medication loxapine and suggestion was made to possibly discontinue this- CT of the head did not show any acute process lab work was unremarkable except for mildly elevated white count of 11.6 urinalysis indicated no significant growth.  She has returned to the facility and continues to have decreased cognition and some weakness- She saw neurology today and was thought to possibly have drug-induced Parkinsonian some Band-Aid also suggested decreasing the loxapine pending psychiatric evaluation-the psychiatric nurse practitioner actually saw her earlier this week and did increase her Lexapro--she will follow-up as well as about discontinuing the loxapine.  Neurology did order an MR I of the brain which is pending  We've also been following her hypertension she had times will spike and actually tonight blood pressure was 200/100 she does have when necessary clonidine and she will need this-at times blood pressure appears to be more moderate with systolics in the 458K again this is quite variable appears to spike at times.  Appears her blood pressure was 147/99 at the neurology appointment today.  Her Toprol-XL has been recently increased to 75 mg a day on Oct 17th-her pulse is 88 this evening     Past Medical History  Diagnosis Date  . Hypertension   . Alzheimer disease   . Fibromyalgia   . Abnormal heart rhythm     a fib  . Colon polyp   . GERD (gastroesophageal reflux disease)   . Diabetes mellitus without complication ([place])   . Neuropathy ([place])   . HOCM (hypertrophic obstructive cardiomyopathy) ([place])   . Aortic stenosis   . Chronic kidney disease     Past Surgical History  Procedure Laterality  Date  . Shoulder surgery Left     rotator cuff      Medication List       This list is accurate as of: 01/03/15  6:32 PM.  Always use your most recent med list.               aspirin 81 MG chewable tablet  Chew 81 mg by mouth  daily.     bisacodyl 10 MG suppository  Commonly known as:  DULCOLAX  Place 10 mg rectally daily as needed for mild constipation or moderate constipation.     cefdinir 300 MG capsule  Commonly known as:  OMNICEF  Take 1 capsule (300 mg total) by mouth 2 (two) times daily.     cloNIDine 0.1 MG tablet  Commonly known as:  CATAPRES  Take 0.1 mg by mouth 2 (two) times daily.     docusate sodium 100 MG capsule  Commonly known as:  COLACE  Take 1 capsule (100 mg total) by mouth daily.     escitalopram 5 MG tablet  Commonly known as:  LEXAPRO  Take 10 mg by mouth daily.     fluticasone 50 MCG/ACT nasal spray  Commonly known as:  FLONASE  Place 2 sprays into both nostrils daily.     glimepiride 2 MG tablet  Commonly known as:  AMARYL  Take 2 mg by mouth daily.     GLUCERNA Liqd  Take 237 mLs by mouth daily as needed (if patient refuses meal.).     loxapine 25 MG capsule  Commonly known as:  LOXITANE  Take 25 mg by mouth at bedtime.     magnesium hydroxide 400 MG/5ML suspension  Commonly known as:  MILK OF MAGNESIA  Take 30 mLs by mouth daily as needed for mild constipation.     metoprolol succinate 50 MG 24 hr tablet  Commonly known as:  TOPROL-XL  Take 75 mg by mouth daily. Take with or immediately following a meal.     pantoprazole 20 MG tablet  Commonly known as:  PROTONIX  Take 20 mg by mouth daily.     THERA Tabs  Take 1 tablet by mouth daily.     vitamin B-12 1000 MCG tablet  Commonly known as:  CYANOCOBALAMIN  Take 1,000 mcg by mouth daily.        No orders of the defined types were placed in this encounter.     There is no immunization history on file for this patient.  Social History  Substance Use Topics  . Smoking status: Never Smoker   . Smokeless tobacco: Not on file  . Alcohol Use: No    Family history is +stroke, DM   Review of Systems  DATA OBTAINED: , nurse  , medical record GENERAL:  no fevers--has, fatigue, appetite  changes--appetite remains poor she needs encouragement SKIN: No itching, rash or wounds EYES: No eye pain, redness, discharge EARS: No earache, tinnitus, change in hearing NOSE: No congestion, drainage or bleeding  MOUTH/THROAT: No mouth or tooth pain, No sore throat RESPIRATORY: No cough, wheezing, SOB CARDIAC: No chest pain, palpitations,  mild lower extremity edema  GI: No abdominal pain, No N/V/D or constipation, No heartburn or reflux  GU: No dysuria, frequency or urgency, or incontinence  MUSCULOSKELETAL: No unrelieved bone/joint pain continues to have weakness her son feels this is progressing NEUROLOGIC: No headache, dizziness or focal weakness stiffness question parkinsonian symptoms present PSYCHIATRIC: No c/o anxiety or sadness--her son feels she's had a  declining cognition and some increased confusion which he feels is not improving --  Filed Vitals:   01/03/15 1828  BP: 200/100  Pulse: 88  Resp: 17    SpO2 Readings from Last 1 Encounters:  12/18/14 94%        Physical Exam  GENERAL APPEARANCE: Alert,but non- conversant,  WF,No acute distress appears quite frail. holds her eyes shut  tightly but responds appropriately  SKIN: No diaphoresis rash HEAD: Normocephalic, atraumatic  EYES: Conjunctiva/lids clear. Pupils round, reactive. EOMs intact.  EARS: External exam WNL, canals clear. Hearing grossly normal.  NOSE: No deformity or discharge.  MOUTH/THROAT: oropharynx is clear mucous membranes appear fairly moist RESPIRATORY: Breathing is even, unlabored. Lung sounds are clear air entry is shallow  CARDIOVASCULAR: Heart RRR 2/6 systolic murmur, no rubs or gallops. Mild  peripheral edema.   GASTROINTESTINAL: Abdomen is --soft, non-tender, not distended w/ normal bowel sounds. GENITOURINARY:, not distended--does not appear to have overt suprapubic tenderness  MUSCULOSKELETAL: No abnormal joints or musculature grip strength appears preserved upper extremities has some  lower extremity weakness but is able to move her legs bilaterally-- quite frail and weak however appears to have somewhat bradycardic movements questionable cogwheeling upper extremities bilaterally slight tremor left leg--this is baseline with previous exam NEUROLOGIC:  Cranial nerves 2-12 grossly intact. Moves all extremities--she does have somewhat slow movements however-- questionable cogwheeling  upper extremities-- her movement  is somewhat slow with what appears to be rigidity there is a slight tremor of her left leg at times --somewhat of a masked facies PSYCHIATRIC:  Is nonconversant currently today about apparently will converse at times with nursing staff and her family-  Patient Active Problem List   Diagnosis Date Noted  . Parkinsonian features 12/25/2014  . Weakness 12/18/2014  . Altered mental status 12/11/2014  . UTI (urinary tract infection) 12/02/2014  . GERD (gastroesophageal reflux disease) 12/02/2014  . Psychosis 12/02/2014  . Renal failure (ARF), acute on chronic (HCC) 11/24/2014  . Hypertension 11/24/2014  . Diabetes mellitus type 2, controlled ([place]) 11/24/2014  . MDD (major depressive disorder) ([place]) 11/24/2014  . Pressure ulcer 11/24/2014  . Elevated troponin 11/23/2014     Labs. 01/02/2015.  WBC 9.7 hemoglobin 12.9 platelets 266.  Creatinine 1.0 BUN 32 potassium 3.6 sodium 143.  Albumin 2.9-alkaline phosphatase 106-ALT 77-AST 60.  TSH-3.28    12/18/2014.  Sodium 141 potassium 4 BUN 23 creatinine 0.98.  Albumin 3.2 otherwise liver function tests within normal limits.  WBC 11.6 hemoglobin 14.2 platelets 209  12/12/2014.  WBC 7.4 hemoglobin 12.6 platelets 294.  Sodium 143 potassium 4 BUN 17 creatinine 1.0.  Assessment and plan.  #1-continued weakness and lethargy and altered mental status- This continues to be a challenging situation She has seen neurology with recommendation for MRI and also titrating down possibly discontinue Lidex  applying-will defer to psychiatry for this-and this was the neurology recommendation as well.  Her lab work actually appears fairly benign-although her appetite apparently continues to be a challenge.  She was seen by the palliative care nurse practitioner on October 21 per her note she felt patient was approaching end of her life and did not recommend tight control of her blood pressure since she has poor perfusion with microvascular disease.  It was thought she would benefit from the added skilled hospice staff as well as license nursing personnel and skilled care.  In regards to hospice eligibility was thought she had an element of end-stage dementia as well as aortic stenosis-I have left  a message with her family and will await their input on this-the also I believe  they would like to talk about the neurology consult.  #2 hypertension again her Toprol has just been increased she has at times systolic and diastolic spikes-at this point will give her her when necessary clonidine and follow protocol to make sure this comes down-clonidine has been effective in the past-again loose control is desired--we may need to add another agent at some point-she does again continue with some variability here  [id number]   Addendum-I have spoken with her family-her daughter-in-law via phone-we did discuss the neurology consult and again family will be actually meeting with the palliative care nurse practitioner tomorrow-an MRI of her brain is pending as noted previously-I suspect her loxapine will be titrated down gradually psychiatric nurse practitioner is aware of this and will follow up   per family they are considering possibly patient going back to assisted living but will need extensive home health support and this will be discussed with the palliative care nurse practitioner tomorrow                    ,

## 2015-01-03 NOTE — Patient Instructions (Signed)
Thank you for coming to see [place] at [facility]. I hope we have been able to provide you high quality care today.  You may receive a patient satisfaction survey over the next few weeks. We would appreciate your feedback and comments so that we may continue to improve ourselves and the health of our patients.  - I will check MRI brain - follow up with psychiatry regarding reducing or stopping loxapine

## 2015-01-04 DIAGNOSIS — R627 Adult failure to thrive: Secondary | ICD-10-CM | POA: Diagnosis not present

## 2015-01-05 ENCOUNTER — Non-Acute Institutional Stay (SKILLED_NURSING_FACILITY): Payer: Medicare Other | Admitting: Internal Medicine

## 2015-01-05 ENCOUNTER — Encounter: Payer: Self-pay | Admitting: Internal Medicine

## 2015-01-05 DIAGNOSIS — R259 Unspecified abnormal involuntary movements: Secondary | ICD-10-CM

## 2015-01-05 DIAGNOSIS — K219 Gastro-esophageal reflux disease without esophagitis: Secondary | ICD-10-CM

## 2015-01-05 DIAGNOSIS — E119 Type 2 diabetes mellitus without complications: Secondary | ICD-10-CM | POA: Diagnosis not present

## 2015-01-05 DIAGNOSIS — I10 Essential (primary) hypertension: Secondary | ICD-10-CM

## 2015-01-05 DIAGNOSIS — F333 Major depressive disorder, recurrent, severe with psychotic symptoms: Secondary | ICD-10-CM

## 2015-01-05 DIAGNOSIS — T148 Other injury of unspecified body region: Secondary | ICD-10-CM | POA: Diagnosis not present

## 2015-01-05 DIAGNOSIS — N179 Acute kidney failure, unspecified: Secondary | ICD-10-CM | POA: Diagnosis not present

## 2015-01-05 DIAGNOSIS — R29818 Other symptoms and signs involving the nervous system: Secondary | ICD-10-CM

## 2015-01-05 DIAGNOSIS — N189 Chronic kidney disease, unspecified: Secondary | ICD-10-CM

## 2015-01-05 DIAGNOSIS — F29 Unspecified psychosis not due to a substance or known physiological condition: Secondary | ICD-10-CM | POA: Diagnosis not present

## 2015-01-05 DIAGNOSIS — IMO0002 Reserved for concepts with insufficient information to code with codable children: Secondary | ICD-10-CM

## 2015-01-05 NOTE — Progress Notes (Signed)
MRN: [id number] Name: Emma Mcguire  Sex: female Age: 81 y.o. DOB: 06-May-1927  [place] #: [name] Facility/Room:505 Level Of Care: SNF Provider: [name] D Emergency Contacts: Extended Emergency Contact Information Primary Emergency Contact: Humphres,James Address: [address] [place], [place] [address] [place] Phone: [phone] Relation: Son Secondary Emergency Contact: Rudell Cobb [place] Phone: [phone] Relation: Other  Code Status: DNR  Allergies: Cymbalta; Lipitor; Metformin and related; Simvastatin; and Verapamil  Chief Complaint  Patient presents with  . Discharge Note    HPI: Patient is 81 y.o. female who was recently admitted at [facility] for psychosis last month and had been placed at assisted living facility in [place] recently was subsequently found to have generalized weakness. She was dx with  ARF, and admitted from 9/15-19 for continued work-up and treatment. Pt's course was complicated by elevated troponin, eventually felt not to be cardiac, and acute UTI. Pt is being admitted to SNF for continued generalized weakness for OT/PT. Pt has had 3 ongoing problems at SNF, parkinsonism, HTN and R heel blister and these are discussed below.  Past Medical History  Diagnosis Date  . Hypertension   . Alzheimer disease   . Fibromyalgia   . Abnormal heart rhythm     a fib  . Colon polyp   . GERD (gastroesophageal reflux disease)   . Diabetes mellitus without complication ([place])   . Neuropathy ([place])   . HOCM (hypertrophic obstructive cardiomyopathy) ([place])   . Aortic stenosis   . Chronic kidney disease     Past Surgical History  Procedure Laterality Date  . Shoulder surgery Left     rotator cuff      Medication List       This list is accurate as of: 01/05/15  3:13 PM.  Always use your most recent med list.               amLODipine 5 MG tablet  Commonly known as:  NORVASC   Take 5 mg by mouth daily.     aspirin 81 MG chewable tablet  Chew 81 mg by mouth daily.     docusate sodium 100 MG capsule  Commonly known as:  COLACE  Take 1 capsule (100 mg total) by mouth daily.     escitalopram 5 MG tablet  Commonly known as:  LEXAPRO  Take 10 mg by mouth daily.     fluticasone 50 MCG/ACT nasal spray  Commonly known as:  FLONASE  Place 2 sprays into both nostrils daily.     GLUCERNA Liqd  Take 237 mLs by mouth daily as needed (if patient refuses meal.).     loxapine 10 MG capsule  Commonly known as:  LOXITANE  Take 10 mg by mouth daily.     magnesium hydroxide 400 MG/5ML suspension  Commonly known as:  MILK OF MAGNESIA  Take 30 mLs by mouth daily as needed for mild constipation.     Metoprolol Tartrate 75 MG Tabs  Take 1 tablet by mouth 2 (two) times daily.     pantoprazole 20 MG tablet  Commonly known as:  PROTONIX  Take 20 mg by mouth daily.     THERA Tabs  Take 1 tablet by mouth daily.     vitamin B-12 1000 MCG tablet  Commonly known as:  CYANOCOBALAMIN  Take 1,000 mcg by mouth daily.        Meds  ordered this encounter  Medications  . DISCONTD: Metoprolol Succinate (TOPROL XL PO)    Sig: Take 75 mg by mouth daily. Toprol XL 25 mg + XL 50 mg  . DISCONTD: Metoprolol Tartrate 75 MG TABS    Sig: Take by mouth.  . loxapine (LOXITANE) 10 MG capsule    Sig: Take 10 mg by mouth daily.  . Metoprolol Tartrate 75 MG TABS    Sig: Take 1 tablet by mouth 2 (two) times daily.  Marland Kitchen amLODipine (NORVASC) 5 MG tablet    Sig: Take 5 mg by mouth daily.     There is no immunization history on file for this patient.  Social History  Substance Use Topics  . Smoking status: Never Smoker   . Smokeless tobacco: Not on file  . Alcohol Use: No    Filed Vitals:   01/05/15 1424  BP: 145/87  Pulse: 60  Temp: 98.7 F (37.1 C)  Resp: 20    Physical Exam  GENERAL APPEARANCE: Alert,  No acute distress.  HEENT: Unremarkable. RESPIRATORY: Breathing  is even, unlabored. Lung sounds are clear   CARDIOVASCULAR: Heart RRR no murmurs, rubs or gallops. No peripheral edema.  GASTROINTESTINAL: Abdomen is soft, non-tender, not distended w/ normal bowel sounds.  NEUROLOGIC: Cranial nerves 2-12 grossly intact. Moves all extremities  Patient Active Problem List   Diagnosis Date Noted  . Blister of skin without infection 01/05/2015  . Parkinsonian features 12/25/2014  . Weakness 12/18/2014  . Altered mental status 12/11/2014  . UTI (urinary tract infection) 12/02/2014  . GERD (gastroesophageal reflux disease) 12/02/2014  . Psychosis 12/02/2014  . Renal failure (ARF), acute on chronic (HCC) 11/24/2014  . Hypertension 11/24/2014  . Diabetes mellitus type 2, controlled ([place]) 11/24/2014  . MDD (major depressive disorder) ([place]) 11/24/2014  . Pressure ulcer 11/24/2014  . Elevated troponin 11/23/2014    CBC    Component Value Date/Time   WBC 11.6* 12/18/2014 1551   RBC 4.71 12/18/2014 1551   HGB 14.2 12/18/2014 1551   HCT 43.0 12/18/2014 1551   PLT 209 12/18/2014 1551   MCV 91.3 12/18/2014 1551   LYMPHSABS 1.4 12/18/2014 1551   MONOABS 1.3* 12/18/2014 1551   EOSABS 0.1 12/18/2014 1551   BASOSABS 0.0 12/18/2014 1551    CMP     Component Value Date/Time   NA 141 12/18/2014 1551   K 4.0 12/18/2014 1551   CL 107 12/18/2014 1551   CO2 25 12/18/2014 1551   GLUCOSE 128* 12/18/2014 1551   BUN 23* 12/18/2014 1551   CREATININE 0.98 12/18/2014 1551   CALCIUM 9.9 12/18/2014 1551   PROT 6.5 12/18/2014 1551   ALBUMIN 3.2* 12/18/2014 1551   AST 34 12/18/2014 1551   ALT 25 12/18/2014 1551   ALKPHOS 103 12/18/2014 1551   BILITOT 0.9 12/18/2014 1551   GFRNONAA 51* 12/18/2014 1551   GFRAA 59* 12/18/2014 1551    Assessment and Plan  Pt is being d/c to ALF with HH/OT/PT/ nursing. Rx have been written.  Parkinsonian features 2/2 to slow initiation to speech and movement pt was sent to neurology  On 10/26 where it was felt that pt had  possibly drug induced Parkinsons. After discussion with Psych, recall recent admit for psychoses, loxitane was decreased from 25 mg daily to 10 mg daily. Response as to psychosis and parkinson's sx will ne to be monitored as outpt.  Hypertension BP started running high as well as pulse so toprol was increased to 75 mg daily; pt's BP is  still on high side, requiring prn clonidine;prn clonidine is not a good outpt drug so norvasc 5 mg daily was started.  Blister of skin without infection R heel;this has improved with wound care and redefined to friction wound or R heel on 10/25; wound care with calcium alginate until resolved   Time spent >35 min; > 50% of time with patient was spent reviewing records, labs, tests and studies, counseling and developing plan of care  Hennie Duos, MD

## 2015-01-05 NOTE — Assessment & Plan Note (Signed)
BP started running high as well as pulse so toprol was increased to 75 mg daily; pt's BP is still on high side, requiring prn clonidine;prn clonidine is not a good outpt drug so norvasc 5 mg daily was started.

## 2015-01-05 NOTE — Assessment & Plan Note (Signed)
R heel;this has improved with wound care and redefined to friction wound or R heel on 10/25; wound care with calcium alginate until resolved

## 2015-01-05 NOTE — Assessment & Plan Note (Signed)
2/2 to slow initiation to speech and movement pt was sent to neurology  On 10/26 where it was felt that pt had possibly drug induced Parkinsons. After discussion with Psych, recall recent admit for psychoses, loxitane was decreased from 25 mg daily to 10 mg daily. Response as to psychosis and parkinson's sx will ne to be monitored as outpt.

## 2015-01-06 DIAGNOSIS — R609 Edema, unspecified: Secondary | ICD-10-CM | POA: Insufficient documentation

## 2015-01-07 ENCOUNTER — Encounter: Payer: Self-pay | Admitting: Internal Medicine

## 2015-01-07 DIAGNOSIS — I10 Essential (primary) hypertension: Secondary | ICD-10-CM | POA: Insufficient documentation

## 2015-01-10 DIAGNOSIS — I129 Hypertensive chronic kidney disease with stage 1 through stage 4 chronic kidney disease, or unspecified chronic kidney disease: Secondary | ICD-10-CM | POA: Diagnosis not present

## 2015-01-10 DIAGNOSIS — G309 Alzheimer's disease, unspecified: Secondary | ICD-10-CM | POA: Diagnosis not present

## 2015-01-10 DIAGNOSIS — E1140 Type 2 diabetes mellitus with diabetic neuropathy, unspecified: Secondary | ICD-10-CM | POA: Diagnosis not present

## 2015-01-10 DIAGNOSIS — N189 Chronic kidney disease, unspecified: Secondary | ICD-10-CM | POA: Diagnosis not present

## 2015-01-10 DIAGNOSIS — L89610 Pressure ulcer of right heel, unstageable: Secondary | ICD-10-CM | POA: Diagnosis not present

## 2015-01-10 DIAGNOSIS — F0280 Dementia in other diseases classified elsewhere without behavioral disturbance: Secondary | ICD-10-CM | POA: Diagnosis not present

## 2015-01-11 ENCOUNTER — Emergency Department (HOSPITAL_COMMUNITY): Payer: Medicare Other

## 2015-01-11 ENCOUNTER — Inpatient Hospital Stay (HOSPITAL_COMMUNITY)
Admission: EM | Admit: 2015-01-11 | Discharge: 2015-01-15 | DRG: 308 | Disposition: A | Discharge status: Skilled Nursing Facility | Payer: Medicare Other | Attending: Internal Medicine | Admitting: Internal Medicine

## 2015-01-11 ENCOUNTER — Encounter (HOSPITAL_COMMUNITY): Payer: Self-pay | Admitting: Emergency Medicine

## 2015-01-11 DIAGNOSIS — I480 Paroxysmal atrial fibrillation: Principal | ICD-10-CM | POA: Diagnosis present

## 2015-01-11 DIAGNOSIS — R7989 Other specified abnormal findings of blood chemistry: Secondary | ICD-10-CM | POA: Diagnosis not present

## 2015-01-11 DIAGNOSIS — Z7982 Long term (current) use of aspirin: Secondary | ICD-10-CM

## 2015-01-11 DIAGNOSIS — I421 Obstructive hypertrophic cardiomyopathy: Secondary | ICD-10-CM | POA: Diagnosis present

## 2015-01-11 DIAGNOSIS — R1310 Dysphagia, unspecified: Secondary | ICD-10-CM | POA: Diagnosis present

## 2015-01-11 DIAGNOSIS — F329 Major depressive disorder, single episode, unspecified: Secondary | ICD-10-CM | POA: Diagnosis not present

## 2015-01-11 DIAGNOSIS — G2119 Other drug induced secondary parkinsonism: Secondary | ICD-10-CM | POA: Diagnosis not present

## 2015-01-11 DIAGNOSIS — I471 Supraventricular tachycardia, unspecified: Secondary | ICD-10-CM | POA: Diagnosis present

## 2015-01-11 DIAGNOSIS — F419 Anxiety disorder, unspecified: Secondary | ICD-10-CM | POA: Insufficient documentation

## 2015-01-11 DIAGNOSIS — Z66 Do not resuscitate: Secondary | ICD-10-CM | POA: Diagnosis present

## 2015-01-11 DIAGNOSIS — E785 Hyperlipidemia, unspecified: Secondary | ICD-10-CM | POA: Insufficient documentation

## 2015-01-11 DIAGNOSIS — G309 Alzheimer's disease, unspecified: Secondary | ICD-10-CM | POA: Diagnosis not present

## 2015-01-11 DIAGNOSIS — E876 Hypokalemia: Secondary | ICD-10-CM | POA: Diagnosis present

## 2015-01-11 DIAGNOSIS — F411 Generalized anxiety disorder: Secondary | ICD-10-CM | POA: Diagnosis present

## 2015-01-11 DIAGNOSIS — I10 Essential (primary) hypertension: Secondary | ICD-10-CM | POA: Diagnosis present

## 2015-01-11 DIAGNOSIS — I248 Other forms of acute ischemic heart disease: Secondary | ICD-10-CM | POA: Diagnosis present

## 2015-01-11 DIAGNOSIS — N189 Chronic kidney disease, unspecified: Secondary | ICD-10-CM | POA: Diagnosis present

## 2015-01-11 DIAGNOSIS — L89610 Pressure ulcer of right heel, unstageable: Secondary | ICD-10-CM | POA: Diagnosis not present

## 2015-01-11 DIAGNOSIS — A419 Sepsis, unspecified organism: Secondary | ICD-10-CM | POA: Diagnosis present

## 2015-01-11 DIAGNOSIS — Z6830 Body mass index (BMI) 30.0-30.9, adult: Secondary | ICD-10-CM | POA: Diagnosis not present

## 2015-01-11 DIAGNOSIS — I4891 Unspecified atrial fibrillation: Secondary | ICD-10-CM | POA: Diagnosis present

## 2015-01-11 DIAGNOSIS — K219 Gastro-esophageal reflux disease without esophagitis: Secondary | ICD-10-CM | POA: Diagnosis present

## 2015-01-11 DIAGNOSIS — G9340 Encephalopathy, unspecified: Secondary | ICD-10-CM | POA: Diagnosis not present

## 2015-01-11 DIAGNOSIS — Z23 Encounter for immunization: Secondary | ICD-10-CM | POA: Diagnosis not present

## 2015-01-11 DIAGNOSIS — R443 Hallucinations, unspecified: Secondary | ICD-10-CM | POA: Diagnosis present

## 2015-01-11 DIAGNOSIS — G219 Secondary parkinsonism, unspecified: Secondary | ICD-10-CM | POA: Insufficient documentation

## 2015-01-11 DIAGNOSIS — R6510 Systemic inflammatory response syndrome (SIRS) of non-infectious origin without acute organ dysfunction: Secondary | ICD-10-CM | POA: Diagnosis present

## 2015-01-11 DIAGNOSIS — I639 Cerebral infarction, unspecified: Secondary | ICD-10-CM | POA: Diagnosis not present

## 2015-01-11 DIAGNOSIS — E119 Type 2 diabetes mellitus without complications: Secondary | ICD-10-CM | POA: Diagnosis not present

## 2015-01-11 DIAGNOSIS — Z823 Family history of stroke: Secondary | ICD-10-CM

## 2015-01-11 DIAGNOSIS — Z993 Dependence on wheelchair: Secondary | ICD-10-CM | POA: Diagnosis not present

## 2015-01-11 DIAGNOSIS — I129 Hypertensive chronic kidney disease with stage 1 through stage 4 chronic kidney disease, or unspecified chronic kidney disease: Secondary | ICD-10-CM | POA: Diagnosis present

## 2015-01-11 DIAGNOSIS — R778 Other specified abnormalities of plasma proteins: Secondary | ICD-10-CM | POA: Diagnosis present

## 2015-01-11 DIAGNOSIS — G8194 Hemiplegia, unspecified affecting left nondominant side: Secondary | ICD-10-CM | POA: Diagnosis present

## 2015-01-11 DIAGNOSIS — F209 Schizophrenia, unspecified: Secondary | ICD-10-CM | POA: Diagnosis present

## 2015-01-11 DIAGNOSIS — R918 Other nonspecific abnormal finding of lung field: Secondary | ICD-10-CM | POA: Diagnosis not present

## 2015-01-11 DIAGNOSIS — E1140 Type 2 diabetes mellitus with diabetic neuropathy, unspecified: Secondary | ICD-10-CM | POA: Diagnosis not present

## 2015-01-11 DIAGNOSIS — F0280 Dementia in other diseases classified elsewhere without behavioral disturbance: Secondary | ICD-10-CM | POA: Diagnosis not present

## 2015-01-11 DIAGNOSIS — I6340 Cerebral infarction due to embolism of unspecified cerebral artery: Secondary | ICD-10-CM | POA: Diagnosis present

## 2015-01-11 DIAGNOSIS — R000 Tachycardia, unspecified: Secondary | ICD-10-CM | POA: Diagnosis not present

## 2015-01-11 DIAGNOSIS — Z888 Allergy status to other drugs, medicaments and biological substances status: Secondary | ICD-10-CM | POA: Diagnosis not present

## 2015-01-11 DIAGNOSIS — I6789 Other cerebrovascular disease: Secondary | ICD-10-CM | POA: Diagnosis not present

## 2015-01-11 DIAGNOSIS — F29 Unspecified psychosis not due to a substance or known physiological condition: Secondary | ICD-10-CM | POA: Diagnosis present

## 2015-01-11 DIAGNOSIS — E1122 Type 2 diabetes mellitus with diabetic chronic kidney disease: Secondary | ICD-10-CM | POA: Diagnosis present

## 2015-01-11 DIAGNOSIS — F32A Depression, unspecified: Secondary | ICD-10-CM | POA: Insufficient documentation

## 2015-01-11 DIAGNOSIS — Z79899 Other long term (current) drug therapy: Secondary | ICD-10-CM | POA: Diagnosis not present

## 2015-01-11 DIAGNOSIS — E669 Obesity, unspecified: Secondary | ICD-10-CM | POA: Diagnosis present

## 2015-01-11 DIAGNOSIS — R4182 Altered mental status, unspecified: Secondary | ICD-10-CM | POA: Diagnosis not present

## 2015-01-11 HISTORY — DX: Unspecified atrial fibrillation: I48.91

## 2015-01-11 LAB — I-STAT TROPONIN, ED: TROPONIN I, POC: 0.11 ng/mL — AB (ref 0.00–0.08)

## 2015-01-11 LAB — BASIC METABOLIC PANEL
ANION GAP: 12 (ref 5–15)
BUN: 18 mg/dL (ref 6–20)
CHLORIDE: 103 mmol/L (ref 101–111)
CO2: 20 mmol/L — ABNORMAL LOW (ref 22–32)
Calcium: 9.6 mg/dL (ref 8.9–10.3)
Creatinine, Ser: 1.01 mg/dL — ABNORMAL HIGH (ref 0.44–1.00)
GFR calc Af Amer: 57 mL/min — ABNORMAL LOW (ref >60)
GFR, EST NON AFRICAN AMERICAN: 49 mL/min — AB (ref >60)
Glucose, Bld: 181 mg/dL — ABNORMAL HIGH (ref 65–99)
POTASSIUM: 3.9 mmol/L (ref 3.5–5.1)
SODIUM: 135 mmol/L (ref 135–145)

## 2015-01-11 LAB — CBC
HEMATOCRIT: 44.4 % (ref 36.0–46.0)
Hemoglobin: 14.6 g/dL (ref 12.0–15.0)
MCH: 30 pg (ref 26.0–34.0)
MCHC: 32.9 g/dL (ref 30.0–36.0)
MCV: 91.4 fL (ref 78.0–100.0)
Platelets: 286 10*3/uL (ref 150–400)
RBC: 4.86 MIL/uL (ref 3.87–5.11)
RDW: 14.1 % (ref 11.5–15.5)
WBC: 12.7 10*3/uL — AB (ref 4.0–10.5)

## 2015-01-11 MED ORDER — AMIODARONE HCL IN DEXTROSE 360-4.14 MG/200ML-% IV SOLN
60.0000 mg/h | Freq: Once | INTRAVENOUS | Status: AC
  Administered 2015-01-11: 60 mg/h via INTRAVENOUS
  Filled 2015-01-11: qty 200

## 2015-01-11 MED ORDER — AMIODARONE HCL IN DEXTROSE 360-4.14 MG/200ML-% IV SOLN: 60.0000 mg/h | Freq: Once | INTRAVENOUS | Status: DC

## 2015-01-11 MED ORDER — AMIODARONE HCL IN DEXTROSE 360-4.14 MG/200ML-% IV SOLN
150.0000 mg/h | Freq: Once | INTRAVENOUS | Status: AC
  Administered 2015-01-11: 150 mg/h via INTRAVENOUS

## 2015-01-11 MED ORDER — SODIUM CHLORIDE 0.9 % IV BOLUS (SEPSIS)
500.0000 mL | Freq: Once | INTRAVENOUS | Status: AC
  Administered 2015-01-11: 500 mL via INTRAVENOUS

## 2015-01-11 MED ORDER — METOPROLOL TARTRATE 1 MG/ML IV SOLN
5.0000 mg | Freq: Once | INTRAVENOUS | Status: AC
Start: 2015-01-11 — End: 2015-01-11
  Administered 2015-01-11: 5 mg via INTRAVENOUS
  Filled 2015-01-11: qty 5

## 2015-01-11 NOTE — ED Provider Notes (Addendum)
CSN: [id number]     Arrival date & time 01/11/15  2040 History   First MD Initiated Contact with Patient 01/11/15 2050     Chief Complaint  Patient presents with  . Atrial Fibrillation     (Consider location/radiation/quality/duration/timing/severity/associated sxs/prior Treatment) HPI..... Level V caveat for dementia.  Patient presents with a tachycardia with associated clamminess. Patient has dementia and has been unable to discuss her history. This is apparently a new finding.  Past Medical History  Diagnosis Date  . Hypertension   . Alzheimer disease   . Fibromyalgia   . Atrial fibrillation ([place])   . Colon polyp   . GERD (gastroesophageal reflux disease)   . Diabetes mellitus without complication ([place])   . Neuropathy ([place])   . HOCM (hypertrophic obstructive cardiomyopathy) ([place])   . Chronic kidney disease    Past Surgical History  Procedure Laterality Date  . Shoulder surgery Left     rotator cuff   Family History  Problem Relation Age of Onset  . Stroke Mother   . Diabetes Mellitus II Maternal Grandmother    Social History  Substance Use Topics  . Smoking status: Never Smoker   . Smokeless tobacco: None  . Alcohol Use: No   OB History    No data available     Review of Systems  Reason unable to perform ROS: dementia.      Allergies  Cymbalta; Lipitor; Metformin and related; Simvastatin; and Verapamil  Home Medications   Prior to Admission medications   Medication Sig Start Date End Date Taking? Authorizing Provider  amLODipine (NORVASC) 5 MG tablet Take 5 mg by mouth daily.   Yes Historical Provider, MD  aspirin 81 MG chewable tablet Chew 81 mg by mouth daily. 11/09/14 11/09/15 Yes Historical Provider, MD  docusate sodium (COLACE) 100 MG capsule Take 1 capsule (100 mg total) by mouth daily. 11/27/14  Yes Reyne Dumas, MD  escitalopram (LEXAPRO) 5 MG tablet Take 10 mg by mouth daily.  11/09/14 02/07/15 Yes Historical Provider, MD  fluticasone (FLONASE) 50  MCG/ACT nasal spray Place 2 sprays into both nostrils daily.   Yes Historical Provider, MD  loxapine (LOXITANE) 10 MG capsule Take 10 mg by mouth daily.   Yes Historical Provider, MD  magnesium hydroxide (MILK OF MAGNESIA) 400 MG/5ML suspension Take 30 mLs by mouth daily as needed for mild constipation.   Yes Historical Provider, MD  metoprolol succinate (TOPROL-XL) 50 MG 24 hr tablet Take 75 mg by mouth daily. Take with or immediately following a meal.   Yes Historical Provider, MD  Multiple Vitamin (THERA) TABS Take 1 tablet by mouth daily. 11/09/14 11/09/15 Yes Historical Provider, MD  pantoprazole (PROTONIX) 20 MG tablet Take 20 mg by mouth daily.   Yes Historical Provider, MD  vitamin B-12 (CYANOCOBALAMIN) 1000 MCG tablet Take 1,000 mcg by mouth daily. 11/09/14 11/09/15 Yes Historical Provider, MD   BP 113/89 mmHg  Pulse 146  Temp(Src) 97.5 F (36.4 C) (Oral)  Resp 21  SpO2 98% Physical Exam  Constitutional: She is oriented to person, place, and time.  demented  HENT:  Head: Normocephalic and atraumatic.  Eyes: Conjunctivae and EOM are normal. Pupils are equal, round, and reactive to light.  Neck: Normal range of motion. Neck supple.  Cardiovascular:  tachycardic  Pulmonary/Chest: Effort normal and breath sounds normal.  Abdominal: Soft. Bowel sounds are normal.  Musculoskeletal: Normal range of motion.  Neurological: She is alert and oriented to person, place, and time.  Skin: Skin is  warm and dry.  Psychiatric:  demented  Nursing note and vitals reviewed.   ED Course  Procedures (including critical care time) Labs Review Labs Reviewed  BASIC METABOLIC PANEL - Abnormal; Notable for the following:    CO2 20 (*)    Glucose, Bld 181 (*)    Creatinine, Ser 1.01 (*)    GFR calc non Af Amer 49 (*)    GFR calc Af Amer 57 (*)    All other components within normal limits  CBC - Abnormal; Notable for the following:    WBC 12.7 (*)    All other components within normal limits   I-STAT TROPOININ, ED - Abnormal; Notable for the following:    Troponin i, poc 0.11 (*)    All other components within normal limits  URINE CULTURE  URINALYSIS, ROUTINE W REFLEX MICROSCOPIC (NOT AT [facility])    Imaging Review Dg Chest Portable 1 View  01/11/2015  CLINICAL DATA:  Pt became cold and clammy at nursing home today, pt was in a-fib upon arrival to ER EXAM: PORTABLE CHEST 1 VIEW COMPARISON:  12/18/2014 FINDINGS: Numerous leads and wires project over the chest. Patient rotated left. Normal heart size. Atherosclerosis in the transverse aorta. Left hemidiaphragm elevation. No pleural effusion or pneumothorax. Left base scarring and volume loss. There is also patchy right base atelectasis. IMPRESSION: No acute cardiopulmonary disease. Left base scarring with volume loss. Electronically Signed   By: Abigail Miyamoto M.D.   On: 01/11/2015 21:32   I have personally reviewed and evaluated these images and lab results as part of my medical decision-making.   EKG Interpretation   Date/Time:  Thursday January 11 2015 20:52:42 EDT Ventricular Rate:  169 PR Interval:    QRS Duration: 74 QT Interval:  278 QTC Calculation: 466 R Axis:   21 Text Interpretation:  Atrial fibrillation with rapid V-rate Probable LVH  with secondary repol abnrm Artifact in lead(s) I III aVL Confirmed by Lacinda Axon   MD, Taje Littler ([id number]) on 01/11/2015 9:13:57 PM Also confirmed by Lacinda Axon  MD,  Azula Zappia ([id number])  on 01/11/2015 11:08:51 PM     CRITICAL CARE Performed by: Nat Christen Total critical care time: 45 minutes Critical care time was exclusive of separately billable procedures and treating other patients. Critical care was necessary to treat or prevent imminent or life-threatening deterioration. Critical care was time spent personally by me on the following activities: development of treatment plan with patient and/or surrogate as well as nursing, discussions with consultants, evaluation of patient's response to treatment,  examination of patient, obtaining history from patient or surrogate, ordering and performing treatments and interventions, ordering and review of laboratory studies, ordering and review of radiographic studies, pulse oximetry and re-evaluation of patient's condition. MDM   Final diagnoses:  SVT (supraventricular tachycardia) ([place])    Patient is allergic to verapamil [unknown response]. Therefore, I could not give her Cardizem. Lopressor 5 mg IV given which made patient hypotensive but did not correct her rapid atrial fibrillation. Discussed options with cardiologist on-call. He recommended amiodarone 150 mg bolus and then an amiodarone drip.  Blood pressure stabilized. Pulse is now 130. Discussed with hospitalist. Admit.    Nat Christen, MD 01/11/15 2357  Nat Christen, MD 01/12/15 Dyann Kief

## 2015-01-11 NOTE — ED Notes (Signed)
Family at bedside, updated on plan of care.

## 2015-01-11 NOTE — H&P (Addendum)
[facility] History and Physical  Emma Mcguire UXL:[id number] DOB: 1927/07/12 DOA: 01/11/2015  Referring physician: ED physician PCP: Tawanna Solo, MD  Specialists:   Chief Complaint: Clammy and rapid heart rate  HPI: Emma Mcguire is a 81 y.o. female with PMH of dementia, atrial fibrillation not on anticoagulants, hypertension, diet-controlled diabetes, GERD, HOCM, psychosis, who presents with clammy and rapid heart rate.  Patient has dementia. She dose not talk, and is unable to provide medical history, therefore, most of the history is obtained by discussing the case with ED physician, per her daughter-in law. Per her daughter-in-law, patient was living by herself independently before August. She developed hallucination and psychosis in August. Patient was admitted to psych unit at the end of August, and discharged to the nursing home at the beginning of September per her daughter-in-law. Since then, pt became wheelchair bounded. Her mental status has been deteriorating. Today pt was noted by staff in SNF to be cool and clammy and has very rapid heart rate. Pt seems to have no pain any where. No diarrhea. In ED, she was found to have A. fib with RVR with HR 150 to 200s.   In ED, patient was found to have WBC 12.7, troponin 0.11, temperature normal, tachycardia, tachypnea, electrolytes okay, renal functioning okay, negative chest x-ray, pending urinalysis and culture. Patient is admitted to inpatient for further evaluation treatment.  Where does patient live?    SNF  Can patient participate in ADLs? None   Review of Systems: Could not be reviewed due to dementia  Allergy:  Allergies  Allergen Reactions  . Cymbalta [Duloxetine Hcl] Other (See Comments)    Listed on MAR  . Lipitor [Atorvastatin] Other (See Comments)    Listed on MAR  . Metformin And Related Other (See Comments)    Listed on MAR  . Simvastatin Other (See Comments)    Listed on MAR  . Verapamil     Sinus  arrest    Past Medical History  Diagnosis Date  . Hypertension   . Alzheimer disease   . Fibromyalgia   . Atrial fibrillation ([place])   . Colon polyp   . GERD (gastroesophageal reflux disease)   . Diabetes mellitus without complication ([place])   . Neuropathy ([place])   . HOCM (hypertrophic obstructive cardiomyopathy) ([place])   . Chronic kidney disease     Past Surgical History  Procedure Laterality Date  . Shoulder surgery Left     rotator cuff    Social History:  reports that she has never smoked. She does not have any smokeless tobacco history on file. She reports that she does not drink alcohol or use illicit drugs.  Family History:  Family History  Problem Relation Age of Onset  . Stroke Mother   . Diabetes Mellitus II Maternal Grandmother      Prior to Admission medications   Medication Sig Start Date End Date Taking? Authorizing Provider  amLODipine (NORVASC) 5 MG tablet Take 5 mg by mouth daily.   Yes Historical Provider, MD  aspirin 81 MG chewable tablet Chew 81 mg by mouth daily. 11/09/14 11/09/15 Yes Historical Provider, MD  docusate sodium (COLACE) 100 MG capsule Take 1 capsule (100 mg total) by mouth daily. 11/27/14  Yes Reyne Dumas, MD  escitalopram (LEXAPRO) 5 MG tablet Take 10 mg by mouth daily.  11/09/14 02/07/15 Yes Historical Provider, MD  fluticasone (FLONASE) 50 MCG/ACT nasal spray Place 2 sprays into both nostrils daily.   Yes Historical Provider, MD  loxapine (LOXITANE) 10 MG capsule Take 10 mg by mouth daily.   Yes Historical Provider, MD  magnesium hydroxide (MILK OF MAGNESIA) 400 MG/5ML suspension Take 30 mLs by mouth daily as needed for mild constipation.   Yes Historical Provider, MD  metoprolol succinate (TOPROL-XL) 50 MG 24 hr tablet Take 75 mg by mouth daily. Take with or immediately following a meal.   Yes Historical Provider, MD  Multiple Vitamin (THERA) TABS Take 1 tablet by mouth daily. 11/09/14 11/09/15 Yes Historical Provider, MD  pantoprazole (PROTONIX) 20  MG tablet Take 20 mg by mouth daily.   Yes Historical Provider, MD  vitamin B-12 (CYANOCOBALAMIN) 1000 MCG tablet Take 1,000 mcg by mouth daily. 11/09/14 11/09/15 Yes Historical Provider, MD    Physical Exam: Filed Vitals:   01/11/15 2245 01/11/15 2315 01/11/15 2345 01/12/15 0015  BP: 113/89 105/82 134/95 133/69  Pulse: 146 127 140 147  Temp:      TempSrc:      Resp: 21 13 21 22   SpO2: 98% 96% 97% 97%   General: Not in acute distress HEENT:       Eyes: PERRL, EOMI, no scleral icterus.       ENT: No discharge from the ears and nose, no pharynx injection, no tonsillar enlargement.        Neck: No JVD, no bruit, no mass felt. Heme: No neck lymph node enlargement. Cardiac: S1/S2, RRR, No murmurs, No gallops or rubs. Pulm: No rales, wheezing, rhonchi or rubs. Abd: Soft, nondistended, nontender, no organomegaly, BS present. Ext: No pitting leg edema bilaterally. 2+DP/PT pulse bilaterally. Musculoskeletal: No joint deformities, No joint redness or warmth, no limitation of ROM in spin. Skin: No rashes.  Neuro: drowsy, not oriented X3, cranial nerves II-XII grossly intact, has rigidity in both leg, not move legs, but move both arms.  Marland KitchenPsych: Could not be reviewed due to dementia   Labs on Admission:  Basic Metabolic Panel:  Recent Labs Lab 01/11/15 2200  NA 135  K 3.9  CL 103  CO2 20*  GLUCOSE 181*  BUN 18  CREATININE 1.01*  CALCIUM 9.6   Liver Function Tests: No results for input(s): AST, ALT, ALKPHOS, BILITOT, PROT, ALBUMIN in the last 168 hours. No results for input(s): LIPASE, AMYLASE in the last 168 hours. No results for input(s): AMMONIA in the last 168 hours. CBC:  Recent Labs Lab 01/11/15 2200  WBC 12.7*  HGB 14.6  HCT 44.4  MCV 91.4  PLT 286   Cardiac Enzymes: No results for input(s): CKTOTAL, CKMB, CKMBINDEX, TROPONINI in the last 168 hours.  BNP (last 3 results) No results for input(s): BNP in the last 8760 hours.  ProBNP (last 3 results) No results for  input(s): PROBNP in the last 8760 hours.  CBG: No results for input(s): GLUCAP in the last 168 hours.  Radiological Exams on Admission: Dg Chest Portable 1 View  01/11/2015  CLINICAL DATA:  Pt became cold and clammy at nursing home today, pt was in a-fib upon arrival to ER EXAM: PORTABLE CHEST 1 VIEW COMPARISON:  12/18/2014 FINDINGS: Numerous leads and wires project over the chest. Patient rotated left. Normal heart size. Atherosclerosis in the transverse aorta. Left hemidiaphragm elevation. No pleural effusion or pneumothorax. Left base scarring and volume loss. There is also patchy right base atelectasis. IMPRESSION: No acute cardiopulmonary disease. Left base scarring with volume loss. Electronically Signed   By: Abigail Miyamoto M.D.   On: 01/11/2015 21:32    EKG: Independently reviewed.  Abnormal  findings: QTc 466, A fib with RVR   Assessment/Plan Principal Problem:   Atrial fibrillation with RVR (HCC) Active Problems:   Elevated troponin   MDD (major depressive disorder) (HCC)   GERD (gastroesophageal reflux disease)   Psychosis   HTN (hypertension)   Alzheimer disease   Diabetes mellitus without complication (HCC)   HOCM (hypertrophic obstructive cardiomyopathy) (HCC)   SVT (supraventricular tachycardia) (HCC)   Atrial fibrillation (HCC)   Sepsis (HCC)  Addendum: MRI of brain showed 7 mm acute infarct RIGHT posterior frontal lobe. - will give one dose of ASA 325 per rectal - d/c Heparin gtt - Risk factor modification: HgbA1c, fasting lipid panel  - get MRA of the brain without contrast  - Speech consult  - 2 d Echocardiogram  - Carotid dopplers  - Aspirin  - consulted Neurology, Dr. Corinna Lines to see.   Addendum: UA showed trace amount of leukocytes, negative nitrite and no Hgb, less likely to have UTI. -will not start Abx -f/u Ux  Atrial fibrillation with RVR ([place]): not sure whether pt has chest pain. Triggering factor is not clear. Pending UA. She meet criteria for sepsis  or SIRS with leukocytosis, tachycardia, tachypnea. Patient is allergic to verapamil [unknown response], therefore, could not give Cardizem. Lopressor 5 mg IV was given by EDP, which made patient hypotensive, but did not correct her rapid atrial fibrillation. EDP discussed options with cardiologist on-call, who recommended amiodarone 150 mg bolus and then an amiodarone drip.   -will admit SDU -Continue amiodarone drip -continue metoprolol -started IV heparin due to both Afib with RVR and elevated trop. -tele monitoring.  Atrial Fibrillation: CHA2DS2-VASc Score is 6, needs oral anticoagulation, but patient is not on AC at home, unclear reason for not on AC, possibly due to risk of fall secondary to dementia and psychosis. Now presents with Afib with RVR and elevated trop. -On IV heparin gtt -on metoprolol  Elevated trop: likely due to demanding ischemia secondary to A. fib with RVR, but cannot rule out ACS. - IV heparin started - cycle CE q6 x3 and repeat her EKG in the am  - aspirin - Risk factor stratification: will check FLP and A1C  - 2d echo - need to consult card in AM  Sepsis vs. SIRS: no source of infection was identified now. CXR negative. No diarrhea. Pending UA and Ux -will get Procalcitonin and trend lactic acid levels per sepsis protocol. -IVF: total of 2L of NS bolus, followed by 100 cc/h -f/u UA. If positive, will start Abx  Depression and anxiety:  -Continue home medications: Lexapro  GERD: -Protonix  HTN: -Hold amlodipine -on metoprolol -IV hydralazine when necessary  Diet controlled DM-II: Last A1c not on record -SSI -Check A1c  Psychosis: She developed hallucination and psychosis in August. Patient was admitted to psych unit at the end of August. She was started Loxapine. She developed rigidity and therefore her dose of Loxapine was tapered down and finally stopped today. She still has rigidity in both legs. She could be benefited by adding anticholinergic  treatment, but given her A. fib with RVR, would not be the choice now. Per daughter-in-law, patient was scheduled for MRI of the brain, but not done yet.  -hold Loxapine -On Lexapro for depression now -will get MRI-brain in hospital  DVT ppx: on IV heparin  Code Status: DNR Family Communication: Yes, patient's daughter-in-law at bed side Disposition Plan: Admit to inpatient   Date of Service 01/12/2015    Ivor Costa [facility] Pager  [phone]  If 7PM-7AM, please contact night-coverage [email or web address] Password [email or web address] 01/12/2015, 1:22 AM

## 2015-01-11 NOTE — ED Notes (Addendum)
X-ray and family bedside.

## 2015-01-11 NOTE — ED Notes (Signed)
Pt arrives from [facility] with new onset cool and clammy, EMS EKG shows a fib with rates anywhere from 150-200 BPM. Pt is alert to her baseline, hx dementia. No IV access so no meds given. BP 138/74, CBG 223

## 2015-01-12 ENCOUNTER — Inpatient Hospital Stay (HOSPITAL_COMMUNITY): Payer: Medicare Other

## 2015-01-12 DIAGNOSIS — I421 Obstructive hypertrophic cardiomyopathy: Secondary | ICD-10-CM

## 2015-01-12 DIAGNOSIS — G9340 Encephalopathy, unspecified: Secondary | ICD-10-CM

## 2015-01-12 DIAGNOSIS — A419 Sepsis, unspecified organism: Secondary | ICD-10-CM | POA: Diagnosis present

## 2015-01-12 DIAGNOSIS — F2089 Other schizophrenia: Secondary | ICD-10-CM

## 2015-01-12 DIAGNOSIS — I471 Supraventricular tachycardia: Secondary | ICD-10-CM

## 2015-01-12 DIAGNOSIS — I639 Cerebral infarction, unspecified: Secondary | ICD-10-CM | POA: Diagnosis present

## 2015-01-12 DIAGNOSIS — F209 Schizophrenia, unspecified: Secondary | ICD-10-CM | POA: Diagnosis present

## 2015-01-12 DIAGNOSIS — I480 Paroxysmal atrial fibrillation: Principal | ICD-10-CM

## 2015-01-12 DIAGNOSIS — I6789 Other cerebrovascular disease: Secondary | ICD-10-CM

## 2015-01-12 DIAGNOSIS — F320 Major depressive disorder, single episode, mild: Secondary | ICD-10-CM

## 2015-01-12 DIAGNOSIS — I4891 Unspecified atrial fibrillation: Secondary | ICD-10-CM | POA: Diagnosis present

## 2015-01-12 LAB — URINALYSIS, ROUTINE W REFLEX MICROSCOPIC
Glucose, UA: NEGATIVE mg/dL
HGB URINE DIPSTICK: NEGATIVE
Ketones, ur: 15 mg/dL — AB
NITRITE: NEGATIVE
PROTEIN: NEGATIVE mg/dL
Specific Gravity, Urine: 1.021 (ref 1.005–1.030)
UROBILINOGEN UA: 4 mg/dL — AB (ref 0.0–1.0)
pH: 5.5 (ref 5.0–8.0)

## 2015-01-12 LAB — CBC
HEMATOCRIT: 36 % (ref 36.0–46.0)
Hemoglobin: 12.3 g/dL (ref 12.0–15.0)
MCH: 30.7 pg (ref 26.0–34.0)
MCHC: 34.2 g/dL (ref 30.0–36.0)
MCV: 89.8 fL (ref 78.0–100.0)
Platelets: 257 10*3/uL (ref 150–400)
RBC: 4.01 MIL/uL (ref 3.87–5.11)
RDW: 14.2 % (ref 11.5–15.5)
WBC: 10.8 10*3/uL — AB (ref 4.0–10.5)

## 2015-01-12 LAB — COMPREHENSIVE METABOLIC PANEL
ALT: 37 U/L (ref 14–54)
ANION GAP: 10 (ref 5–15)
AST: 39 U/L (ref 15–41)
Albumin: 2.1 g/dL — ABNORMAL LOW (ref 3.5–5.0)
Alkaline Phosphatase: 96 U/L (ref 38–126)
BILIRUBIN TOTAL: 1.2 mg/dL (ref 0.3–1.2)
BUN: 16 mg/dL (ref 6–20)
CO2: 22 mmol/L (ref 22–32)
Calcium: 8.9 mg/dL (ref 8.9–10.3)
Chloride: 106 mmol/L (ref 101–111)
Creatinine, Ser: 0.97 mg/dL (ref 0.44–1.00)
GFR calc Af Amer: >60 mL/min (ref >60)
GFR, EST NON AFRICAN AMERICAN: 52 mL/min — AB (ref >60)
Glucose, Bld: 147 mg/dL — ABNORMAL HIGH (ref 65–99)
POTASSIUM: 3.4 mmol/L — AB (ref 3.5–5.1)
Sodium: 138 mmol/L (ref 135–145)
TOTAL PROTEIN: 5 g/dL — AB (ref 6.5–8.1)

## 2015-01-12 LAB — URINE MICROSCOPIC-ADD ON

## 2015-01-12 LAB — TROPONIN I
TROPONIN I: 0.08 ng/mL — AB (ref <0.031)
TROPONIN I: 0.09 ng/mL — AB (ref <0.031)
TROPONIN I: 0.06 ng/mL — AB (ref <0.031)

## 2015-01-12 LAB — PROTIME-INR
INR: 1.23 (ref 0.00–1.49)
Prothrombin Time: 15.6 seconds — ABNORMAL HIGH (ref 11.6–15.2)

## 2015-01-12 LAB — APTT: APTT: 24 s (ref 24–37)

## 2015-01-12 LAB — PROCALCITONIN: PROCALCITONIN: 0.1 ng/mL

## 2015-01-12 LAB — LIPID PANEL
Cholesterol: 217 mg/dL — ABNORMAL HIGH (ref 0–200)
HDL: 20 mg/dL — ABNORMAL LOW (ref >40)
LDL CALC: 156 mg/dL — AB (ref 0–99)
Total CHOL/HDL Ratio: 10.9 RATIO
Triglycerides: 207 mg/dL — ABNORMAL HIGH (ref <150)
VLDL: 41 mg/dL — ABNORMAL HIGH (ref 0–40)

## 2015-01-12 LAB — GLUCOSE, CAPILLARY
GLUCOSE-CAPILLARY: 141 mg/dL — AB (ref 65–99)
GLUCOSE-CAPILLARY: 98 mg/dL (ref 65–99)
Glucose-Capillary: 124 mg/dL — ABNORMAL HIGH (ref 65–99)

## 2015-01-12 LAB — TSH: TSH: 3.421 u[IU]/mL (ref 0.350–4.500)

## 2015-01-12 LAB — MRSA PCR SCREENING: MRSA BY PCR: NEGATIVE

## 2015-01-12 LAB — LACTIC ACID, PLASMA
LACTIC ACID, VENOUS: 1.9 mmol/L (ref 0.5–2.0)
Lactic Acid, Venous: 1.6 mmol/L (ref 0.5–2.0)

## 2015-01-12 MED ORDER — AMIODARONE HCL IN DEXTROSE 360-4.14 MG/200ML-% IV SOLN
30.0000 mg/h | INTRAVENOUS | Status: DC
  Filled 2015-01-12 (×3): qty 200

## 2015-01-12 MED ORDER — SODIUM CHLORIDE 0.9 % IV SOLN
INTRAVENOUS | Status: DC
  Administered 2015-01-12 – 2015-01-14 (×4): via INTRAVENOUS

## 2015-01-12 MED ORDER — PANTOPRAZOLE SODIUM 20 MG PO TBEC
20.0000 mg | DELAYED_RELEASE_TABLET | Freq: Every day | ORAL | Status: DC
  Administered 2015-01-12 – 2015-01-14 (×3): 20 mg via ORAL
  Filled 2015-01-12 (×3): qty 1

## 2015-01-12 MED ORDER — MAGNESIUM HYDROXIDE 400 MG/5ML PO SUSP
30.0000 mL | Freq: Every day | ORAL | Status: DC | PRN
  Filled 2015-01-12: qty 30

## 2015-01-12 MED ORDER — METOPROLOL TARTRATE 25 MG/10 ML ORAL SUSPENSION
37.5000 mg | Freq: Two times a day (BID) | ORAL | Status: DC
  Administered 2015-01-13 – 2015-01-14 (×3): 37.5 mg
  Filled 2015-01-12 (×4): qty 15

## 2015-01-12 MED ORDER — THERA PO TABS: 1.0000 | ORAL_TABLET | Freq: Every day | ORAL | Status: DC

## 2015-01-12 MED ORDER — ENOXAPARIN SODIUM 40 MG/0.4ML ~~LOC~~ SOLN
40.0000 mg | SUBCUTANEOUS | Status: DC
  Administered 2015-01-12 – 2015-01-15 (×4): 40 mg via SUBCUTANEOUS
  Filled 2015-01-12 (×4): qty 0.4

## 2015-01-12 MED ORDER — HEPARIN BOLUS VIA INFUSION
4000.0000 [IU] | Freq: Once | INTRAVENOUS | Status: DC
  Filled 2015-01-12: qty 4000

## 2015-01-12 MED ORDER — ESCITALOPRAM OXALATE 10 MG PO TABS
10.0000 mg | ORAL_TABLET | Freq: Every day | ORAL | Status: DC
  Administered 2015-01-12 – 2015-01-15 (×4): 10 mg via ORAL
  Filled 2015-01-12 (×4): qty 1

## 2015-01-12 MED ORDER — FLUTICASONE PROPIONATE 50 MCG/ACT NA SUSP
2.0000 | Freq: Every day | NASAL | Status: DC
  Administered 2015-01-12 – 2015-01-13 (×2): 2 via NASAL
  Filled 2015-01-12: qty 16

## 2015-01-12 MED ORDER — ASPIRIN 300 MG RE SUPP
300.0000 mg | Freq: Once | RECTAL | Status: AC
  Administered 2015-01-12: 300 mg via RECTAL
  Filled 2015-01-12: qty 1

## 2015-01-12 MED ORDER — ENSURE ENLIVE PO LIQD
237.0000 mL | Freq: Two times a day (BID) | ORAL | Status: DC
  Administered 2015-01-13 – 2015-01-15 (×6): 237 mL via ORAL

## 2015-01-12 MED ORDER — ACETAMINOPHEN 650 MG RE SUPP: 650.0000 mg | Freq: Four times a day (QID) | RECTAL | Status: DC | PRN

## 2015-01-12 MED ORDER — ADULT MULTIVITAMIN W/MINERALS CH
1.0000 | ORAL_TABLET | Freq: Every day | ORAL | Status: DC
  Administered 2015-01-12 – 2015-01-15 (×4): 1 via ORAL
  Filled 2015-01-12 (×4): qty 1

## 2015-01-12 MED ORDER — ASPIRIN 81 MG PO CHEW: 81.0000 mg | CHEWABLE_TABLET | Freq: Every day | ORAL | Status: DC

## 2015-01-12 MED ORDER — SODIUM CHLORIDE 0.9 % IJ SOLN
3.0000 mL | Freq: Two times a day (BID) | INTRAMUSCULAR | Status: DC
  Administered 2015-01-12 – 2015-01-14 (×3): 3 mL via INTRAVENOUS

## 2015-01-12 MED ORDER — ONDANSETRON HCL 4 MG PO TABS: 4.0000 mg | ORAL_TABLET | Freq: Four times a day (QID) | ORAL | Status: DC | PRN

## 2015-01-12 MED ORDER — HEPARIN (PORCINE) IN NACL 100-0.45 UNIT/ML-% IJ SOLN
1150.0000 [IU]/h | INTRAMUSCULAR | Status: DC
Start: 2015-01-12 — End: 2015-01-12
  Filled 2015-01-12: qty 250

## 2015-01-12 MED ORDER — SODIUM CHLORIDE 0.9 % IV BOLUS (SEPSIS)
1000.0000 mL | Freq: Once | INTRAVENOUS | Status: AC
  Administered 2015-01-12: 1000 mL via INTRAVENOUS

## 2015-01-12 MED ORDER — DOCUSATE SODIUM 100 MG PO CAPS
100.0000 mg | ORAL_CAPSULE | Freq: Every day | ORAL | Status: DC
  Administered 2015-01-12 – 2015-01-15 (×4): 100 mg via ORAL
  Filled 2015-01-12 (×4): qty 1

## 2015-01-12 MED ORDER — METOPROLOL SUCCINATE ER 25 MG PO TB24: 75.0000 mg | ORAL_TABLET | Freq: Every day | ORAL | Status: DC

## 2015-01-12 MED ORDER — STROKE: EARLY STAGES OF RECOVERY BOOK
Freq: Once | Status: AC
  Administered 2015-01-12: 03:00:00
  Filled 2015-01-12: qty 1

## 2015-01-12 MED ORDER — HYDRALAZINE HCL 20 MG/ML IJ SOLN
5.0000 mg | INTRAMUSCULAR | Status: DC | PRN
  Administered 2015-01-14 – 2015-01-15 (×4): 5 mg via INTRAVENOUS
  Filled 2015-01-12 (×4): qty 1

## 2015-01-12 MED ORDER — INSULIN ASPART 100 UNIT/ML ~~LOC~~ SOLN
0.0000 [IU] | Freq: Three times a day (TID) | SUBCUTANEOUS | Status: DC
  Administered 2015-01-12 – 2015-01-15 (×7): 1 [IU] via SUBCUTANEOUS

## 2015-01-12 MED ORDER — ASPIRIN 81 MG PO CHEW
324.0000 mg | CHEWABLE_TABLET | Freq: Every day | ORAL | Status: DC
  Administered 2015-01-12 – 2015-01-15 (×4): 324 mg via ORAL
  Filled 2015-01-12 (×4): qty 4

## 2015-01-12 MED ORDER — VITAMIN B-12 1000 MCG PO TABS
1000.0000 ug | ORAL_TABLET | Freq: Every day | ORAL | Status: DC
Start: 2015-01-12 — End: 2015-01-15
  Administered 2015-01-12 – 2015-01-15 (×4): 1000 ug via ORAL
  Filled 2015-01-12 (×4): qty 1

## 2015-01-12 MED ORDER — ONDANSETRON HCL 4 MG/2ML IJ SOLN: 4.0000 mg | Freq: Four times a day (QID) | INTRAMUSCULAR | Status: DC | PRN

## 2015-01-12 MED ORDER — ACETAMINOPHEN 325 MG PO TABS: 650.0000 mg | ORAL_TABLET | Freq: Four times a day (QID) | ORAL | Status: DC | PRN

## 2015-01-12 NOTE — Progress Notes (Signed)
Dr. Blaine Hamper paged about this patient arriving to the floor. MD updated about patient's condition. No new orders at this time. Informed pt converted to NSR in ED, do not give amio bolus.  Will continue to monitor patient.

## 2015-01-12 NOTE — ED Notes (Signed)
IV team bedside. 

## 2015-01-12 NOTE — ED Notes (Signed)
Discussed MRA with Katie, MRI tech. Pt will need to be sedated for MRA scan, however pt is lethargic and RN is not comfortable sedating pt further. Plan to readdress MRA when pt is more alert.

## 2015-01-12 NOTE — Progress Notes (Signed)
[place] TEAM 1 - Stepdown/ICU TEAM Progress Note  Emma Mcguire KVQ:[id number] DOB: [birth year] DOA: 01/11/2015 PCP: Tawanna Solo, MD  Admit HPI / Brief Narrative: 81 y.o. WF PMHx Dementia, Alzheimer's Dz, Fibromyalgia, Depression, Anxiety, Psychosis Atrial Fibrillation not on anticoagulants, Hypertropic Obstructive Cardiomyopathy (HOCM), HTN, diet-controlled DM Type 2, CKD.  Who presents with clammy and rapid heart rate. Patient has dementia. She dose not talk, and is unable to provide medical history, therefore, most of the history is obtained by discussing the case with ED physician, per her daughter-in law. Per her daughter-in-law, patient was living by herself independently before August. She developed hallucination and psychosis in August. Patient was admitted to psych unit at the end of August, and discharged to the nursing home at the beginning of September per her daughter-in-law. Since then, pt became wheelchair bounded. Her mental status has been deteriorating. Today pt was noted by staff in SNF to be cool and clammy and has very rapid heart rate. Pt seems to have no pain any where. No diarrhea. In ED, she was found to have A. fib with RVR with HR 150 to 200s.   In ED, patient was found to have WBC 12.7, troponin 0.11, temperature normal, tachycardia, tachypnea, electrolytes okay, renal functioning okay, negative chest x-ray, pending urinalysis and culture. Patient is admitted to inpatient for further evaluation treatment.   HPI/Subjective: 11/4 A/O 1 (does not know where, when, why), follows some commands  Assessment/Plan: Addendum: MRI of brain showed 7 mm acute infarct RIGHT posterior frontal lobe. - will give one dose of ASA 325 per rectal - d/c Heparin gtt - Risk factor modification: HgbA1c, fasting lipid panel  - get MRA of the brain without contrast  - Speech consult  - 2 d Echocardiogram  - Carotid dopplers  - Aspirin  - consulted Neurology, Dr. Corinna Lines to  see.   Addendum: UA showed trace amount of leukocytes, negative nitrite and no Hgb, less likely to have UTI. -will not start Abx -f/u Ux  Atrial fibrillation with RVR ([place]): not sure whether pt has chest pain. Triggering factor is not clear. Pending UA. She meet criteria for sepsis or SIRS with leukocytosis, tachycardia, tachypnea. Patient is allergic to verapamil [unknown response], therefore, could not give Cardizem. Lopressor 5 mg IV was given by EDP, which made patient hypotensive, but did not correct her rapid atrial fibrillation. EDP discussed options with cardiologist on-call, who recommended amiodarone 150 mg bolus and then an amiodarone drip.   -will admit SDU -Continue amiodarone drip -continue metoprolol -started IV heparin due to both Afib with RVR and elevated trop. -tele monitoring.  Atrial Fibrillation: CHA2DS2-VASc Score is 5, needs oral anticoagulation, but patient is not on AC at home, unclear reason for not on AC, possibly due to risk of fall secondary to dementia and psychosis. Now presents with Afib with RVR and elevated trop. -On IV heparin gtt -on metoprolol  Elevated trop: likely due to demanding ischemia secondary to A. fib with RVR, but cannot rule out ACS. - IV heparin started - cycle CE q6 x3 and repeat her EKG in the am  - aspirin - Risk factor stratification: will check FLP and A1C  - 2d echo - need to consult card in AM  Sepsis vs. SIRS: no source of infection was identified now. CXR negative. No diarrhea. Pending UA and Ux -will get Procalcitonin and trend lactic acid levels per sepsis protocol. -IVF: total of 2L of NS bolus, followed by 100 cc/h -f/u UA. If  positive, will start Abx  Depression and anxiety:  -Continue home medications: Lexapro  GERD: -Protonix  HTN: -Hold amlodipine -on metoprolol -IV hydralazine when necessary  Diet controlled DM-II: Last A1c not on record -SSI -Check A1c  Psychosis: She developed hallucination and  psychosis in August. Patient was admitted to psych unit at the end of August. She was started Loxapine. She developed rigidity and therefore her dose of Loxapine was tapered down and finally stopped today. She still has rigidity in both legs. She could be benefited by adding anticholinergic treatment, but given her A. fib with RVR, would not be the choice now. Per daughter-in-law, patient was scheduled for MRI of the brain, but not done yet.  -hold Loxapine -On Lexapro for depression now -will get MRI-brain in hospital    Code Status: FULL Family Communication: no family present at time of exam Disposition Plan: Per neurology    Consultants: Dr.Pramod S Sethi (stroke team)   Procedure/Significant Events: 11/4 MRI brain without contrast;-7 mm acute infarct RIGHT posterior frontal lobe. -Old bilateral basal ganglia/ thalamus lacunar infarcts, tiny inferior cerebellar lacunar infarcts. 11/4 carotid ultrasound; 1-39% internal carotid artery stenosis bilaterally. The right vertebral artery is patent with antegrade flow. Unable to visualize the left vertebral artery. 11/14 EEG results pending   Culture 10/4 urine pending 10/4 blood pending 10/4 MRSA by PCR negative   Antibiotics: NA  DVT prophylaxis: Lovenox   Devices    LINES / TUBES:      Continuous Infusions: . sodium chloride 100 mL/hr at 01/12/15 1130  . amiodarone      Objective: VITAL SIGNS: Temp: 98.6 F (37 C) (11/04 1515) Temp Source: Oral (11/04 1515) BP: 160/75 mmHg (11/04 1800) Pulse Rate: 85 (11/04 1900) SPO2; FIO2:   Intake/Output Summary (Last 24 hours) at 01/12/15 1903 Last data filed at 01/12/15 1900  Gross per 24 hour  Intake   3903 ml  Output      1 ml  Net   3902 ml     Exam: General: No acute respiratory distress Eyes: Negative headache, eye pain, double vision,negative scleral hemorrhage ENT: Negative Runny nose, negative ear pain, negative gingival bleeding, Neck:  Negative  scars, masses, torticollis, lymphadenopathy, JVD Lungs: Clear to auscultation bilaterally without wheezes or crackles Cardiovascular: Regular rate and rhythm without murmur gallop or rub normal S1 and S2 Abdomen:negative abdominal pain, nondistended, positive soft, bowel sounds, no rebound, no ascites, no appreciable mass Extremities: No significant cyanosis, clubbing, or edema bilateral lower extremities Psychiatric:  Negative depression, negative anxiety, negative fatigue, negative mania  Neurologic:  Cranial nerves II through XII intact, tongue/uvula midline, upper extremities muscle strength 3-4/5, lower extremity strength 1/5  sensation intact throughout,negative dysarthria, negative expressive aphasia, negative receptive aphasia.   Data Reviewed: Basic Metabolic Panel:  Recent Labs Lab 01/11/15 2200 01/12/15 0543  NA 135 138  K 3.9 3.4*  CL 103 106  CO2 20* 22  GLUCOSE 181* 147*  BUN 18 16  CREATININE 1.01* 0.97  CALCIUM 9.6 8.9   Liver Function Tests:  Recent Labs Lab 01/12/15 0543  AST 39  ALT 37  ALKPHOS 96  BILITOT 1.2  PROT 5.0*  ALBUMIN 2.1*   No results for input(s): LIPASE, AMYLASE in the last 168 hours. No results for input(s): AMMONIA in the last 168 hours. CBC:  Recent Labs Lab 01/11/15 2200 01/12/15 0543  WBC 12.7* 10.8*  HGB 14.6 12.3  HCT 44.4 36.0  MCV 91.4 89.8  PLT 286 257   Cardiac  Enzymes:  Recent Labs Lab 01/12/15 0100 01/12/15 0620 01/12/15 1240  TROPONINI 0.08* 0.09* 0.06*   BNP (last 3 results) No results for input(s): BNP in the last 8760 hours.  ProBNP (last 3 results) No results for input(s): PROBNP in the last 8760 hours.  CBG:  Recent Labs Lab 01/12/15 0734 01/12/15 1119 01/12/15 1806  GLUCAP 141* 124* 98    Recent Results (from the past 240 hour(s))  MRSA PCR Screening     Status: None   Collection Time: 01/12/15  6:10 AM  Result Value Ref Range Status   MRSA by PCR NEGATIVE NEGATIVE Final    Comment:         The GeneXpert MRSA Assay (FDA approved for NASAL specimens only), is one component of a comprehensive MRSA colonization surveillance program. It is not intended to diagnose MRSA infection nor to guide or monitor treatment for MRSA infections.      Studies:  Recent x-ray studies have been reviewed in detail by the Attending Physician  Scheduled Meds:  Scheduled Meds: . aspirin  324 mg Oral Daily  . docusate sodium  100 mg Oral Daily  . enoxaparin (LOVENOX) injection  40 mg Subcutaneous Q24H  . escitalopram  10 mg Oral Daily  . feeding supplement (ENSURE ENLIVE)  237 mL Oral BID BM  . fluticasone  2 spray Each Nare Daily  . insulin aspart  0-9 Units Subcutaneous TID WC  . [START ON 01/13/2015] metoprolol tartrate  37.5 mg Per Tube BID  . multivitamin with minerals  1 tablet Oral Daily  . pantoprazole  20 mg Oral Daily  . sodium chloride  3 mL Intravenous Q12H  . vitamin B-12  1,000 mcg Oral Daily    Time spent on care of this patient: 40 mins   Jarquavious Fentress, Geraldo Docker , MD  [facility] Office  [phone] Pager - [phone]  On-Call/Text Page:      [email or web address]      password [email or web address]  If 7PM-7AM, please contact night-coverage [email or web address] Password [email or web address] 01/12/2015, 7:03 PM   LOS: 1 day   Care during the described time interval was provided by me .  I have reviewed this patient's available data, including medical history, events of note, physical examination, and all test results as part of my evaluation. I have personally reviewed and interpreted all radiology studies.   Dia Crawford, MD [phone] Pager

## 2015-01-12 NOTE — Progress Notes (Addendum)
*  PRELIMINARY RESULTS* Vascular Ultrasound Carotid Duplex (Doppler) has been completed.   Findings suggest 1-39% internal carotid artery stenosis bilaterally. The right vertebral artery is patent with antegrade flow. Unable to visualize the left vertebral artery.  01/12/2015 3:41 PM Maudry Mayhew, RVT, RDCS, RDMS

## 2015-01-12 NOTE — Consult Note (Signed)
Stroke Consult Consulting Physician: Dr Blaine Hamper  Chief Complaint: altered mental status  HPI: Emma Mcguire is an 81 y.o. female hx of A fib (not on anticoagulation), HTN, DM, dementia initially presenting to ED with rapid heart rate. In ED found to be in A fib with RVR, had an elevated troponin and concern of sepsis vs SIRS with positive UA. Had MRI brain completed, imaging reviewed, shows a 47mm acute infarct in the right posterior frontal lobe.   Patient recently evaluated by Dr Leta Baptist at [facility] for progressive decline and possible parkinsonism. 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   Date last known well: unclear Time last known well: unclear tPA Given: no, outside tpA window, unclear LSW Modified Rankin: Rankin Score=4  Past Medical History  Diagnosis Date  . Hypertension   . Alzheimer disease   . Fibromyalgia   . Atrial fibrillation ([place])   . Colon polyp   . GERD (gastroesophageal reflux disease)   . Diabetes mellitus without complication ([place])   . Neuropathy ([place])   . HOCM (hypertrophic obstructive cardiomyopathy) ([place])   . Chronic kidney disease     Past Surgical History  Procedure Laterality Date  . Shoulder surgery Left     rotator cuff    Family History  Problem Relation Age of Onset  . Stroke Mother   . Diabetes Mellitus II Maternal Grandmother    Social History:  reports that she has never smoked. She does not have any smokeless tobacco history on file. She reports that she does not drink alcohol or use illicit drugs.  Allergies:  Allergies  Allergen Reactions  . Cymbalta [Duloxetine Hcl] Other (See Comments)    Listed on MAR  . Lipitor [Atorvastatin] Other (See Comments)    Listed on MAR  . Metformin And Related Other (See Comments)    Listed on MAR  . Simvastatin Other (See Comments)    Listed on MAR  . Verapamil     Sinus arrest     (Not in a hospital admission)  ROS: Out of a complete 14 system review, the patient complains of  only the following symptoms, and all other reviewed systems are negative. +altered mental status  Physical Examination: Filed Vitals:   01/12/15 0234  BP:   Pulse: 78  Temp:   Resp: 19   Physical Exam  Constitutional: He appears well-developed and well-nourished.  Psych: Affect appropriate to situation Eyes: No scleral injection HENT: No OP obstrucion Head: Normocephalic.  Cardiovascular: Normal rate and regular rhythm.  Respiratory: Effort normal and breath sounds normal.  GI: Soft. Bowel sounds are normal. No distension. There is no tenderness.  Skin: WDI   Neurologic Examination: Mental Status: Eyes closed, non-verbal, minimally opens eyes to noxious stimuli. Not following commands Cranial Nerves: II: optic discs not visualized, blinks to threat bilaterally, pupils equal, round, reactive to light  III,IV, VI: ptosis not present, eyes midline, no gaze deviation V,VII: face symmetric, unable to test facial sensation VIII: hearing normal bilaterally IX,X: cough reflex present XI: unable to test XII: unable to test Motor: Increased tone throughout No spontaneous movement noted Tone and bulk:normal tone throughout; no atrophy noted Sensory: grimaces to noxious stimuli in all 4 extremities Deep Tendon Reflexes: 1+ and symmetric throughout Plantars: Right: downgoing   Left: downgoing Cerebellar: Unable to test Gait: unable to test  Laboratory Studies:   Basic Metabolic Panel:  Recent Labs Lab 01/11/15 2200  NA 135  K 3.9  CL 103  CO2 20*  GLUCOSE 181*  BUN 18  CREATININE 1.01*  CALCIUM 9.6    Liver Function Tests: No results for input(s): AST, ALT, ALKPHOS, BILITOT, PROT, ALBUMIN in the last 168 hours. No results for input(s): LIPASE, AMYLASE in the last 168 hours. No results for input(s): AMMONIA in the last 168 hours.  CBC:  Recent Labs Lab 01/11/15 2200  WBC 12.7*  HGB 14.6  HCT 44.4  MCV 91.4  PLT 286    Cardiac Enzymes:  Recent  Labs Lab 01/12/15 0100  TROPONINI 0.08*    BNP: Invalid input(s): POCBNP  CBG: No results for input(s): GLUCAP in the last 168 hours.  Microbiology: Results for orders placed or performed during the hospital encounter of 12/18/14  Urine culture     Status: None   Collection Time: 12/18/14  4:05 PM  Result Value Ref Range Status   Specimen Description URINE, CATHETERIZED  Final   Special Requests NONE  Final   Culture   Final    NO GROWTH 1 DAY Performed at [facility]    Report Status 12/19/2014 FINAL  Final    Coagulation Studies: No results for input(s): LABPROT, INR in the last 72 hours.  Urinalysis:  Recent Labs Lab 01/12/15 0030  COLORURINE ORANGE*  LABSPEC 1.021  PHURINE 5.5  GLUCOSEU NEGATIVE  HGBUR NEGATIVE  BILIRUBINUR SMALL*  KETONESUR 15*  PROTEINUR NEGATIVE  UROBILINOGEN 4.0*  NITRITE NEGATIVE  LEUKOCYTESUR TRACE*    Lipid Panel:  No results found for: CHOL, TRIG, HDL, CHOLHDL, VLDL, LDLCALC  HgbA1C: No results found for: HGBA1C  Urine Drug Screen:  No results found for: LABOPIA, COCAINSCRNUR, LABBENZ, AMPHETMU, THCU, LABBARB  Alcohol Level: No results for input(s): ETH in the last 168 hours.  Other results:  Imaging: Mr Brain Wo Contrast  01/12/2015  CLINICAL DATA:  Diaphoresis and tachycardia. History dementia, atrial fibrillation not on anticoagulant, hypertension, chronic kidney disease. EXAM: MRI HEAD WITHOUT CONTRAST TECHNIQUE: Multiplanar, multiecho pulse sequences of the brain and surrounding structures were obtained without intravenous contrast. COMPARISON:  CT head December 18, 2014 FINDINGS: 7 mm focus of reduced diffusion RIGHT posterior frontal lobe, with corresponding low ADC value. No susceptibility artifact to suggest hemorrhage. Ventricles and sulci are overall normal for patient's age. Confluent supratentorial and pontine white matter T2 hyperintensities. Tiny old bilateral inferior cerebellar infarcts. Patchy T2  hyperintense old old the infarcts in the thalamus in addition to perivascular spaces and lacunar infarcts in basal ganglia. No midline shift, mass effect or mass lesions. No abnormal extra-axial fluid collections. No extra-axial masses though, contrast enhanced sequences would be more sensitive. Normal major intracranial vascular flow voids seen at the skull base. Dolicoectatic intracranial vessels most compatible with chronic hypertension. Status post bilateral ocular lens implants. No abnormal sellar expansion. Visualized paranasal sinuses and mastoid air cells are well-aerated. No suspicious calvarial bone marrow signal. Craniocervical junction maintained. IMPRESSION: 7 mm acute infarct RIGHT posterior frontal lobe. Chronic changes including severe chronic small vessel ischemic disease and old bilateral basal ganglia/ thalamus lacunar infarcts, tiny inferior cerebellar lacunar infarcts. Electronically Signed   By: Elon Alas M.D.   On: 01/12/2015 02:31   Dg Chest Portable 1 View  01/11/2015  CLINICAL DATA:  Pt became cold and clammy at nursing home today, pt was in a-fib upon arrival to ER EXAM: PORTABLE CHEST 1 VIEW COMPARISON:  12/18/2014 FINDINGS: Numerous leads and wires project over the chest. Patient rotated left. Normal heart size. Atherosclerosis in the transverse aorta. Left hemidiaphragm elevation. No pleural effusion or pneumothorax.  Left base scarring and volume loss. There is also patchy right base atelectasis. IMPRESSION: No acute cardiopulmonary disease. Left base scarring with volume loss. Electronically Signed   By: Abigail Miyamoto M.D.   On: 01/11/2015 21:32    Assessment: 81 y.o. female hx of A fib (not on anticoagulation), drug-induced parkinsonism found to have a small right frontal ischemic infarct. Admitted for further stroke workup.    Plan: 1. HgbA1c, fasting lipid panel 2. MRA  of the brain without contrast 3. PT consult, OT consult, Speech consult 4. Echocardiogram 5.  Carotid dopplers 6. Prophylactic therapy-ASA 325mg . With hx of recent falls will need to weigh risk/benefit of starting anticoagulation 7. Risk factor modification 8. Telemetry monitoring 9. Frequent neuro checks 10. NPO until RN stroke swallow screen 11. EEG   Jim Like, DO [facility] [phone]  If 7pm- 7am, please page neurology on call as listed in [place]. 01/12/2015, 2:55 AM

## 2015-01-12 NOTE — Progress Notes (Signed)
Pt began coughing after first and second spoonful of crushed chewable aspirin. Remainder of crushed dose held. Lungs auscultated and no change was heard- clear and diminished in upper lobes and lower lobes. Will continue to monitor.

## 2015-01-12 NOTE — Evaluation (Signed)
Clinical/Bedside Swallow Evaluation Patient Details  Name: Emma Mcguire MRN: [id number] Date of Birth: 1928/02/01  Today's Date: 01/12/2015 Time: SLP Start Time (ACUTE ONLY): 0810 SLP Stop Time (ACUTE ONLY): 2951 SLP Time Calculation (min) (ACUTE ONLY): 27 min  Past Medical History:  Past Medical History  Diagnosis Date  . Hypertension   . Alzheimer disease   . Fibromyalgia   . Atrial fibrillation ([place])   . Colon polyp   . GERD (gastroesophageal reflux disease)   . Diabetes mellitus without complication ([place])   . Neuropathy ([place])   . HOCM (hypertrophic obstructive cardiomyopathy) ([place])   . Chronic kidney disease    Past Surgical History:  Past Surgical History  Procedure Laterality Date  . Shoulder surgery Left     rotator cuff   HPI:  Emma Mcguire is an 81 y.o. female hx of A fib (not on anticoagulation), HTN, DM, dementia initially presenting to ED with rapid heart rate. In ED found to be in A fib with RVR, had an elevated troponin and concern of sepsis vs SIRS with positive UA. Had MRI brain completed, imaging reviewed, shows a 69mm acute infarct in the right posterior frontal lobe. Patient recently evaluated by Dr Emma Mcguire at [facility] for progressive decline and possible parkinsonism. 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    Assessment / Plan / Recommendation Clinical Impression  Pt demonstrates adequate airway protection with no signs of aspiration with liquids or solids and no sign of neuromuscular weakness. Pt does have a history of dementia related oral dysphagia per daughter in law, and has been on a puree diet due to oral holding in the facility. She is able to masticate on today's assessment, but did need one verbal cue to complete mastication and transit bolus. Recommend pt initiate a Dys 1 (puree) diet with thin liquids and pills whole in puree, crushed if pt pockets. SLP will f/u for diet tolerance and to facilitate safe feeding in setting of dementia.     Aspiration Risk  Mild    Diet Recommendation Dysphagia 1 (Puree);Thin   Medication Administration: Whole meds with puree Compensations: Check for pocketing    Other  Recommendations Oral Care Recommendations: Oral care BID Other Recommendations: Have oral suction available   Follow Up Recommendations       Frequency and Duration min 1 x/week  2 weeks   Pertinent Vitals/Pain NA    SLP Swallow Goals     Swallow Study Prior Functional Status       General Other Pertinent Information: Emma Mcguire is an 81 y.o. female hx of A fib (not on anticoagulation), HTN, DM, dementia initially presenting to ED with rapid heart rate. In ED found to be in A fib with RVR, had an elevated troponin and concern of sepsis vs SIRS with positive UA. Had MRI brain completed, imaging reviewed, shows a 51mm acute infarct in the right posterior frontal lobe. Patient recently evaluated by Dr Emma Mcguire at [facility] for progressive decline and possible parkinsonism. 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  Type of Study: Bedside swallow evaluation Previous Swallow Assessment: Had been on puree foods at SNF Diet Prior to this Study: NPO Temperature Spikes Noted: No Respiratory Status: Room air History of Recent Intubation: No Behavior/Cognition: Alert;Cooperative;Requires cueing Oral Cavity - Dentition: Adequate natural dentition/normal for age Self-Feeding Abilities: Total assist Patient Positioning: Upright in bed Baseline Vocal Quality: Normal Volitional Cough: Strong Volitional Swallow: Unable to elicit    Oral/Motor/Sensory Function Overall Oral Motor/Sensory Function: Other (comment) (no focal  weakness, doesnt follow all oral motor commands)   Ice Chips     Thin Liquid Thin Liquid: Within functional limits    Nectar Thick Nectar Thick Liquid: Not tested   Honey Thick Honey Thick Liquid: Not tested   Puree Puree: Within functional limits   Solid   GO    Solid: Impaired Oral  Phase Impairments: Poor awareness of bolus (initially aware, needed cues to complete)      Emma [place], MA CCC-SLP [phone]  Emma Mcguire, Emma Mcguire 01/12/2015,8:56 AM

## 2015-01-12 NOTE — Progress Notes (Addendum)
Initial Nutrition Assessment  DOCUMENTATION CODES:   Obesity unspecified  INTERVENTION:   Ensure Enlive po BID, each supplement provides 350 kcal and 20 grams of protein  NUTRITION DIAGNOSIS:   Predicted suboptimal nutrient intake related to dysphagia (lethargy) as evidenced by  (chart review, BSE)  GOAL:   Patient will meet greater than or equal to 90% of their needs  MONITOR:   PO intake, Supplement acceptance, Labs, Weight trends, Skin, I & O's  REASON FOR ASSESSMENT:   Malnutrition Screening Tool  ASSESSMENT:   81 y.o. Female with PMH of hx  HTN, DM, dementia initially presenting to ED with rapid heart rate. In ED found to be in A fib with RVR, had an elevated troponin and concern of sepsis vs SIRS with positive UA. Had MRI brain completed, imaging reviewed, shows a 68mm acute infarct in the right posterior frontal lobe.   Pt sleeping soundly upon RD visit.  No family present.  S/p bedside swallow evaluation 11/4.  Diet advanced to this AM to Dys 1, thin liquids.  No % meal consumption available at this time; suspect pt intake to be poor to suboptimal.  Would benefit from oral nutrition supplements.  RD to order.  RD unable to complete Nutrition Focused Physical Exam at this time.  Diet Order:  DIET - DYS 1 Room service appropriate?: Yes; Fluid consistency:: Thin  Skin:  Wound (see comment) (DTI to ankle)  Last BM:  11/4  Height:   Ht Readings from Last 1 Encounters:  01/12/15 5\' 5"  (1.651 m)    Weight:   Wt Readings from Last 1 Encounters:  01/12/15 181 lb 10.5 oz (82.4 kg)    Ideal Body Weight:  56.8 kg  BMI:  Body mass index is 30.23 kg/(m^2).  Estimated Nutritional Needs:   Kcal:  1650-1850  Protein:  80-90 gm  Fluid:  1.6-1.8 L  EDUCATION NEEDS:   No education needs identified at this time  Arthur Holms, RD, LDN Pager #: [phone] After-Hours Pager #: [phone]

## 2015-01-12 NOTE — ED Notes (Signed)
Per EMS report, pt is at baseline for mentation. Family member states that she has been lethargic for 6-8 weeks, refusing PO intake, not moving bilateral extremities, not opening her eyes. Pt has more weakness on L side, however movement is present. Unclear last known well, so no code stroke called.

## 2015-01-12 NOTE — Consult Note (Signed)
CARDIOLOGY CONSULT NOTE   Patient ID: Emma Mcguire MRN: [id number], DOB/AGE: 79-Mar-1929   Admit date: 01/11/2015 Date of Consult: 01/12/2015 Reason for  Consult: Atrial Fibrillation, Elevated Troponin  Primary Physician: Tawanna Solo, MD Primary Cardiologist: New  HPI: Emma Mcguire is a 81 y.o. female with PMH of HTN, paroxysmal atrial fibrillation (not on anticoagulation), Alzheimer's Disease, Type 2 DM, Hypertropic cardiomyopathy, and CKD who presented to [facility] ED on 01/11/2015 for evaluation of lethargy and new onset of feeling cold and clammy.  According to notes reviewed in Epic, she has been lethargic for 6-8 weeks and refusing to eat.   While in the ED, the patient's troponin was found to be elevated at 0.11. Cyclic troponin values have been 0.08, 0.09, and 0.06. The patient was started on an Amiodarone drip while in the ED. She converted to NSR on 01/12/2015 and her Amiodarone drip was discontinued. This is demonstrated on her EKG as well.   She had MRI of the brain today which showed a 51mm acute infarct in the right posterior frontal lobe. She had been on Heparin for her elevated troponin and atrial fibrillation, but this was discontinued in the setting of an acute CVA.  On examination today, the patient denies any chest pain, palpitations, or shortness of breath. She denies any current pain. She responds yes/no to questions, but does not elaborate. She is aware of her name and where she is currently at. She is unsure if she has ever seen a cardiologist in the past and does not know how long she was diagnosed with atrial fibrillation. When asked about past medical history, family history, and social history she responds by saying "I do not know".   Problem List Past Medical History  Diagnosis Date  . Hypertension   . Alzheimer disease   . Fibromyalgia   . Atrial fibrillation ([place])   . Colon polyp   . GERD (gastroesophageal reflux disease)   . Diabetes mellitus  without complication ([place])   . Neuropathy ([place])   . HOCM (hypertrophic obstructive cardiomyopathy) ([place])   . Chronic kidney disease     Past Surgical History  Procedure Laterality Date  . Shoulder surgery Left     rotator cuff     Allergies Allergies  Allergen Reactions  . Cymbalta [Duloxetine Hcl] Other (See Comments)    Listed on MAR  . Lipitor [Atorvastatin] Other (See Comments)    Listed on MAR  . Metformin And Related Other (See Comments)    Listed on MAR  . Simvastatin Other (See Comments)    Listed on MAR  . Verapamil     Sinus arrest      Inpatient Medications . aspirin  324 mg Oral Daily  . docusate sodium  100 mg Oral Daily  . enoxaparin (LOVENOX) injection  40 mg Subcutaneous Q24H  . escitalopram  10 mg Oral Daily  . fluticasone  2 spray Each Nare Daily  . insulin aspart  0-9 Units Subcutaneous TID WC  . [START ON 01/13/2015] metoprolol tartrate  37.5 mg Per Tube BID  . multivitamin with minerals  1 tablet Oral Daily  . pantoprazole  20 mg Oral Daily  . sodium chloride  3 mL Intravenous Q12H  . vitamin B-12  1,000 mcg Oral Daily    Family History Family History  Problem Relation Age of Onset  . Stroke Mother   . Diabetes Mellitus II Maternal Grandmother      Social History Social History  Social History  . Marital Status: Widowed    Spouse Name: N/A  . Number of Children: 4  . Years of Education: 12   Occupational History  . Not on file.   Social History Main Topics  . Smoking status: Never Smoker   . Smokeless tobacco: Not on file  . Alcohol Use: No  . Drug Use: No  . Sexual Activity: Not on file   Other Topics Concern  . Not on file   Social History Narrative   Lives in SNF, [place] , widowed, lived at home until 4-5 weeks ago   No caffeine     Review of Systems General:  No chills, fever, night sweats or weight changes. Positive for decreased appetite. Cardiovascular:  No chest pain, dyspnea on exertion, edema,  orthopnea, palpitations, paroxysmal nocturnal dyspnea. Dermatological: No rash, lesions/masses Respiratory: No cough, dyspnea Urologic: No hematuria, dysuria Abdominal:   No nausea, vomiting, diarrhea, bright red blood per rectum, melena, or hematemesis Neurologic:  No visual changes, wkns, Positive for changes in mental status. All other systems reviewed and are otherwise negative except as noted above.  Physical Exam  Blood pressure 134/53, pulse 74, temperature 98.8 F (37.1 C), temperature source Oral, resp. rate 16, height 5\' 5"  (1.651 m), weight 181 lb 10.5 oz (82.4 kg), SpO2 97 %.  General: Caucasian female in NAD Psych: Normal affect. Neuro: Alert and oriented X 2. Moves all extremities spontaneously. HEENT: Normal  Neck: Supple without bruits or JVD. Lungs:  Resp regular and unlabored, CTA without wheezing or rales anteriorly. Heart: RRR no s3, s4, or murmurs. Abdomen: Soft, non-tender, non-distended, BS + x 4.  Extremities: No clubbing or cyanosis. Trace edema. DP/PT/Radials 2+ and equal bilaterally.  Labs   Recent Labs  01/12/15 0100 01/12/15 0620 01/12/15 1240  TROPONINI 0.08* 0.09* 0.06*   Lab Results  Component Value Date   WBC 10.8* 01/12/2015   HGB 12.3 01/12/2015   HCT 36.0 01/12/2015   MCV 89.8 01/12/2015   PLT 257 01/12/2015    Recent Labs Lab 01/12/15 0543  NA 138  K 3.4*  CL 106  CO2 22  BUN 16  CREATININE 0.97  CALCIUM 8.9  PROT 5.0*  BILITOT 1.2  ALKPHOS 96  ALT 37  AST 39  GLUCOSE 147*   Lab Results  Component Value Date   CHOL 217* 01/12/2015   HDL 20* 01/12/2015   LDLCALC 156* 01/12/2015   TRIG 207* 01/12/2015    Radiology/Studies Mr Brain Wo Contrast: 01/12/2015  CLINICAL DATA:  Diaphoresis and tachycardia. History dementia, atrial fibrillation not on anticoagulant, hypertension, chronic kidney disease. EXAM: MRI HEAD WITHOUT CONTRAST TECHNIQUE: Multiplanar, multiecho pulse sequences of the brain and surrounding structures  were obtained without intravenous contrast. COMPARISON:  CT head December 18, 2014 FINDINGS: 7 mm focus of reduced diffusion RIGHT posterior frontal lobe, with corresponding low ADC value. No susceptibility artifact to suggest hemorrhage. Ventricles and sulci are overall normal for patient's age. Confluent supratentorial and pontine white matter T2 hyperintensities. Tiny old bilateral inferior cerebellar infarcts. Patchy T2 hyperintense old old the infarcts in the thalamus in addition to perivascular spaces and lacunar infarcts in basal ganglia. No midline shift, mass effect or mass lesions. No abnormal extra-axial fluid collections. No extra-axial masses though, contrast enhanced sequences would be more sensitive. Normal major intracranial vascular flow voids seen at the skull base. Dolicoectatic intracranial vessels most compatible with chronic hypertension. Status post bilateral ocular lens implants. No abnormal sellar expansion. Visualized paranasal sinuses  and mastoid air cells are well-aerated. No suspicious calvarial bone marrow signal. Craniocervical junction maintained. IMPRESSION: 7 mm acute infarct RIGHT posterior frontal lobe. Chronic changes including severe chronic small vessel ischemic disease and old bilateral basal ganglia/ thalamus lacunar infarcts, tiny inferior cerebellar lacunar infarcts. Electronically Signed   By: Elon Alas M.D.   On: 01/12/2015 02:31   Dg Chest Portable 1 View: 01/11/2015  CLINICAL DATA:  Pt became cold and clammy at nursing home today, pt was in a-fib upon arrival to ER EXAM: PORTABLE CHEST 1 VIEW COMPARISON:  12/18/2014 FINDINGS: Numerous leads and wires project over the chest. Patient rotated left. Normal heart size. Atherosclerosis in the transverse aorta. Left hemidiaphragm elevation. No pleural effusion or pneumothorax. Left base scarring and volume loss. There is also patchy right base atelectasis. IMPRESSION: No acute cardiopulmonary disease. Left base  scarring with volume loss. Electronically Signed   By: Abigail Miyamoto M.D.   On: 01/11/2015 21:32    ECG: Atrial fibrillation with RVR, rate in 160's.   ECHOCARDIOGRAM: 01/12/2015 Study Conclusions  - Left ventricle: The cavity size was normal. Wall thickness was increased in a pattern of mild LVH. Systolic function was normal. The estimated ejection fraction was in the range of 60% to 65%. There was dynamic obstruction. There was dynamic obstruction in the outflow tract. Wall motion was normal; there were no regional wall motion abnormalities. Doppler parameters are consistent with abnormal left ventricular relaxation (grade 1 diastolic dysfunction). - Mitral valve: Calcified annulus. Mildly thickened leaflets .  ASSESSMENT AND PLAN  1. Paroxysmal Atrial Fibrillation - Initially presented in atrial fibrillation with RVR, rate in the 160's - 170's. Was started on Amiodarone drip.  - patient unable to elaborate on how long she has been diagnosed with atrial fibrillation, how many times she has been in the rhythm, or to provide further information on why she is not on anticoagulation due to her dementia. Unfortunately, no family members are present at the time of examination to elaborate on this. Her EKG in 11/2014 does show NSR at that time. - This patients CHA2DS2-VASc Score and unadjusted Ischemic Stroke Rate (% per year) is equal to 9.7 % stroke rate/year from a score of 6  (HTN, Type 2 DM, Age > 75 x 2, Stroke x 2). Had been on Heparin but this was discontinued in the setting of acute CVA. Risks of anticoagulation would outweigh the benefit in this wheelchair bound patient with dementia. - Telemetry reviewed and shows artifact which is accompanied by likely muscular contractions. No further occurrence of atrial fibrillation.  2. Elevated Troponin - Cyclic troponin values have been 0.08, 0.09, and 0.06. - denies any anginal symptoms at this time. - Echocardiogram is without  any wall motion abnormalities. - likely to represent demand ischemia in the setting of atrial fibrillation with RVR. Would not recommend further cardiac workup at this time.  3. Acute CVA - MRI on 01/12/2015 showing 7 mm acute infarct RIGHT posterior frontal lobe. - Neurology following   Signed, Erma Heritage, PA-C 01/12/2015, 3:10 PM Pager: [phone]  Personally seen and examined. Agree with above.  81 year old lady with dementia, DO NOT RESUSCITATE with mildly elevated troponin, flat in the setting of atrial fibrillation with rapid ventricular response with recent acute stroke right posterior frontal lobe.  Agree with above findings. She is not currently on anticoagulation because of underlying dementia, high fall risk and I agree with this as risks would outweigh benefits. She is at high risk for recurrent  stroke. Continue with aspirin. Agree with metoprolol to help minimize the chance of atrial fibrillation in the future.  Minimally elevated troponin consistent with demand ischemia in the setting of A. fib RVR. Not true ACS. No further cardiology evaluation. Ejection fraction normal.  Questionable atrial flutter on telemetry-I carefully monitored her during these episodes and she clearly has tremulousness of her upper body that is periodic and shows up on the monitor as giving the appearance of atrial flutter. This is artifact. She is maintaining sinus rhythm.  We will go ahead and sign off. Please call us with any questions.  Candee Furbish, MD

## 2015-01-12 NOTE — Progress Notes (Signed)
STROKE TEAM PROGRESS NOTE   HISTORY Emma Mcguire is an 81 y.o. female hx of A fib (not on anticoagulation), HTN, DM, dementia initially presenting to ED with rapid heart rate. In ED found to be in A fib with RVR, had an elevated troponin and concern of sepsis vs SIRS with positive UA. Had MRI brain completed, imaging reviewed, shows a 52mm acute infarct in the right posterior frontal lobe. Patient recently evaluated by Dr Leta Baptist at [facility] for progressive decline and possible parkinsonism. 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 Patient was not considered for TPA secondary to unknown last known well. She was admitted to the ICU for further evaluation and treatment.   SUBJECTIVE (INTERVAL HISTORY) Her RN and nursing student are at the bedside.  Patient is sleeping, but easily arouses. She knows her name, age and that she is in a hospital. She has difficulty following commands.    OBJECTIVE Temp:  [97.5 F (36.4 C)] 97.5 F (36.4 C) (11/03 2057) Pulse Rate:  [72-165] 72 (11/04 0700) Cardiac Rhythm:  [-] Normal sinus rhythm (11/04 0620) Resp:  [11-28] 18 (11/04 0700) BP: (86-157)/(46-95) 95/51 mmHg (11/04 0700) SpO2:  [91 %-98 %] 95 % (11/04 0700) Weight:  [82.4 kg (181 lb 10.5 oz)] 82.4 kg (181 lb 10.5 oz) (11/04 0620)  CBC:  Recent Labs Lab 01/11/15 2200 01/12/15 0543  WBC 12.7* 10.8*  HGB 14.6 12.3  HCT 44.4 36.0  MCV 91.4 89.8  PLT 286 027    Basic Metabolic Panel:  Recent Labs Lab 01/11/15 2200 01/12/15 0543  NA 135 138  K 3.9 3.4*  CL 103 106  CO2 20* 22  GLUCOSE 181* 147*  BUN 18 16  CREATININE 1.01* 0.97  CALCIUM 9.6 8.9    Lipid Panel:    Component Value Date/Time   CHOL 217* 01/12/2015 0543   TRIG 207* 01/12/2015 0543   HDL 20* 01/12/2015 0543   CHOLHDL 10.9 01/12/2015 0543   VLDL 41* 01/12/2015 0543   LDLCALC 156* 01/12/2015 0543   HgbA1c: No results found for: HGBA1C Urine Drug Screen: No results found for: LABOPIA, COCAINSCRNUR,  LABBENZ, AMPHETMU, THCU, LABBARB    IMAGING  Mr Brain Wo Contrast 01/12/2015   7 mm acute infarct RIGHT posterior frontal lobe. Chronic changes including severe chronic small vessel ischemic disease and old bilateral basal ganglia/ thalamus lacunar infarcts, tiny inferior cerebellar lacunar infarcts.   Dg Chest Portable 1 View 01/11/2015  No acute cardiopulmonary disease. Left base scarring with volume loss.       PHYSICAL EXAM Elderly caucasian lady not in distress. . Afebrile. Head is nontraumatic. Neck is supple without bruit.    Cardiac exam no murmur or gallop. Lungs are clear to auscultation. Distal pulses are well felt. Neurologic Examination: Mental Status: Eyes closed, non-verbal, minimally opens eyes to noxious stimuli. Not following commands Cranial Nerves: II: optic discs not visualized, blinks to threat bilaterally, pupils equal, round, reactive to light  III,IV, VI: ptosis not present, eyes midline, no gaze deviation V,VII: face symmetric, unable to test facial sensation VIII: hearing normal bilaterally IX,X: cough reflex present XI: unable to test XII: unable to test Motor: Increased tone throughout No spontaneous movement noted Tone and bulk:normal tone throughout; no atrophy noted Sensory: grimaces to noxious stimuli in all 4 extremities Deep Tendon Reflexes: 1+ and symmetric throughout Plantars: Right: downgoingLeft: downgoing Cerebellar: Unable to test Gait: unable to test ASSESSMENT/PLAN Ms. Emma Mcguire is a 81 y.o. female with history of A fib (not on  anticoagulation), HTN, DM, dementia presenting with rapid heart rate. MRI revealed an acute stroke. She did not receive IV t-PA due to unknown LKW.   Stroke:  Incidental right posterior frontal lobe infarct embolic secondary to atrial fibrillation   MRI  R posterior frontal lobe infarct  MRA  Will cancel. Will not change treatment. Check TCD instead  Carotid Doppler   pending   2D Echo  pending   Check EEG  LDL 156  HgbA1c pending  Nothing in place for VTE prophylaxis. Added lovenox 40   Diet NPO time specified  aspirin 81 mg daily prior to admission, received aspirin suppositry this am, scheduled for aspirin 324 mg daily  Ongoing aggressive stroke risk factor management  Therapy recommendations:  pending   Disposition:  pending   Atrial Fibrillation w/ RVR  Home anticoagulation:  None  Converted to NSR during the night  Allergic to cardizem   Hypertension  Stable  Hyperlipidemia  Home meds:  No statin  LDL 156, goal < 70  Add statin once able to swallow  Diabetes  HgbA1c pending, goal < 7.0  Other Stroke Risk Factors  Advanced age  Obesity, Body mass index is 30.23 kg/(m^2).   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 I have made any additions or clarifications directly to the above note. Agree with note above. She has presented with altered mental status and left hemiparesis.due to right frontal infarct from atrial fibrillation and remains at risk for neurological worsening, recurrent stroke/TIAs and needs ongoing stroke evaluation and aggressive risk factor modification.Swallow eval by speech therapist. Not a anticoagulation candidate due to dementia and now fall risk.   Antony Contras, MD Medical Director [facility] Pager: [phone] 01/12/2015 12:46 PM    To contact Stroke Continuity provider, please refer to [email or web address] After hours, contact General Neurology

## 2015-01-12 NOTE — Evaluation (Signed)
Occupational Therapy Evaluation Patient Details Name: Emma Mcguire MRN: [id number] DOB: 27-Dec-1927 Today's Date: 01/12/2015    History of Present Illness Patient is a 81 y/o female presents from nursing home with rapid heart and found to be in A. fib with RVR with HR 150 to 200s. MRI-44mm acute infarct in the right posterior frontal lobe.  PMH includes drug-induced parkinsonism, Alzhemier's, DM, fibromyalgia, neuropathy, HTN, A-fib, CKD.   Clinical Impression   Pt admitted with above. She demonstrates the below listed deficits and will benefit from continued OT to maximize safety and independence with BADLs.  Pt currently requires total A for all aspects of ADLs.  She demonstrates significantly impaired attention and initiation.  She was able to sit EOB today with mod - max A, heavy Lt lean.  Will follow acutely.  Recommend SNF.       Follow Up Recommendations  SNF;Supervision/Assistance - 24 hour    Equipment Recommendations  None recommended by OT    Recommendations for Other Services       Precautions / Restrictions Precautions Precautions: Fall Restrictions Weight Bearing Restrictions: No      Mobility Bed Mobility Overal bed mobility: Needs Assistance Bed Mobility: Supine to Sit;Sit to Supine     Supine to sit: +2 for physical assistance;Total assist;HOB elevated Sit to supine: +2 for physical assistance;Total assist;HOB elevated   General bed mobility comments: Assist with LEs, bottom and elevating trunk. Pt with left lateral lean.  Transfers Overall transfer level:  (unable to safely assess )                    Balance Overall balance assessment: Needs assistance Sitting-balance support: Feet supported Sitting balance-Leahy Scale: Zero Sitting balance - Comments: Required assist to maintain static/dynamic sitting balance. Left lateral trunk lean. Able to initiate right lateral trunk flexors with manual cues for upright posture however fatigues. No  righting reactions noted with LOB. Tolerated AAROM BLEs in sitting with support.  Postural control: Left lateral lean                                  ADL Overall ADL's : Needs assistance/impaired     Grooming: Wash/dry hands;Wash/dry face;Oral care;Brushing hair;Total assistance;Sitting   Upper Body Bathing: Total assistance   Lower Body Bathing: Total assistance   Upper Body Dressing : Total assistance   Lower Body Dressing: Total assistance   Toilet Transfer: Total assistance Toilet Transfer Details (indicate cue type and reason): unable to safely attempt  Toileting- Clothing Manipulation and Hygiene: Total assistance       Functional mobility during ADLs: Total assistance General ADL Comments: Pt unable to sustain attention to ADL tasks to engage in them      Vision Additional Comments: unable to assess due to cognitive deficits    Perception     Praxis Praxis Praxis tested?: Deficits Deficits: Initiation    Pertinent Vitals/Pain Pain Assessment: Faces Faces Pain Scale: No hurt     Hand Dominance Right   Extremity/Trunk Assessment Upper Extremity Assessment Upper Extremity Assessment: RUE deficits/detail;LUE deficits/detail RUE Deficits / Details: PROM WFL.  Pt demonstrates ~ 40% active hand to mouth.  ~ 35* shoulder flexion.  She did not actively extend digits, but is able to squeeze therapist's hands.  Edema noted. Pt unable to participate in full UE assessment due to cognitive deficits  RUE Coordination: decreased fine motor;decreased gross motor LUE Deficits / Details:  PROM WFL.  Pt demonstrates ~60* shoulder flex and ~90* hand to mouth actively.  Full AROM finger flex/ext.  Holds wrist in flexed position, unsure if she has weakness in wrist.  Accurate assessment difficult due to pt confusion  LUE Coordination: decreased fine motor;decreased gross motor   Lower Extremity Assessment Lower Extremity Assessment: Defer to PT evaluation    Cervical / Trunk Assessment Cervical / Trunk Assessment: Other exceptions Cervical / Trunk Exceptions: poor trunk contral.  Pushes to Lt   Communication Communication Communication: No difficulties   Cognition Arousal/Alertness: Awake/alert Behavior During Therapy: Flat affect Overall Cognitive Status: No family/caregiver present to determine baseline cognitive functioning Area of Impairment: Attention;Memory;Following commands;Problem solving;Orientation Orientation Level: Disoriented to;Place (stated [facility]) Current Attention Level: Focused Memory: Decreased short-term memory Following Commands: Follows one step commands inconsistently;Follows one step commands with increased time     Problem Solving: Slow processing;Decreased initiation;Difficulty sequencing;Requires verbal cues;Requires tactile cues     General Comments       Exercises       Shoulder Instructions      Home Living Family/patient expects to be discharged to:: Skilled nursing facility                                        Prior Functioning/Environment Level of Independence: Needs assistance    ADL's / Homemaking Assistance Needed: Per MD notes, total A for ADls.    Comments: Pt not able to provide accurate PLOF/history due to Alzhiemer's. Per PT notes from ~1 month ago, pt was ambulating to dining hall when living in ALF however since September has been in SNF.    OT Diagnosis: Generalized weakness;Cognitive deficits   OT Problem List: Decreased strength;Decreased range of motion;Decreased activity tolerance;Impaired balance (sitting and/or standing);Decreased coordination;Decreased cognition;Decreased safety awareness;Decreased knowledge of use of DME or AE;Decreased knowledge of precautions;Impaired UE functional use   OT Treatment/Interventions: Self-care/ADL training;Therapeutic exercise;Neuromuscular education;DME and/or AE instruction;Therapeutic activities;Cognitive  remediation/compensation;Patient/family education;Balance training    OT Goals(Current goals can be found in the care plan section) Acute Rehab OT Goals OT Goal Formulation: Patient unable to participate in goal setting Time For Goal Achievement: 01/26/15 Potential to Achieve Goals: Fair ADL Goals Pt Will Perform Grooming: with mod assist;sitting Pt Will Transfer to Toilet: with mod assist;with +2 assist;bedside commode Pt/caregiver will Perform Home Exercise Program: Increased ROM;Increased strength;Right Upper extremity;Left upper extremity;With minimal assist  OT Frequency: Min 2X/week   Barriers to D/C: Decreased caregiver support          Co-evaluation PT/OT/SLP Co-Evaluation/Treatment: Yes Reason for Co-Treatment: Complexity of the patient's impairments (multi-system involvement);For patient/therapist safety   OT goals addressed during session: Strengthening/ROM;ADL's and self-care      End of Session Nurse Communication: Mobility status  Activity Tolerance: Patient limited by fatigue Patient left: in bed;with call bell/phone within reach   Time: 1154-1221 OT Time Calculation (min): 27 min Charges:  OT General Charges $OT Visit: 1 Procedure OT Evaluation $Initial OT Evaluation Tier I: 1 Procedure G-Codes:    Lucille Passy M 2015-01-20, 9:44 PM

## 2015-01-12 NOTE — Evaluation (Signed)
Speech Language Pathology Evaluation Patient Details Name: Emma Mcguire MRN: [id number] DOB: May 01, 1927 Today's Date: 01/12/2015 Time: 4098-1191 SLP Time Calculation (min) (ACUTE ONLY): 27 min  Problem List:  Patient Active Problem List   Diagnosis Date Noted  . Atrial fibrillation ([place]) 01/12/2015  . Sepsis ([place]) 01/12/2015  . Ischemic stroke ([place]) 01/12/2015  . Atrial fibrillation with RVR ([place]) 01/11/2015  . SVT (supraventricular tachycardia) ([place]) 01/11/2015  . Alzheimer disease   . Diabetes mellitus without complication ([place])   . HOCM (hypertrophic obstructive cardiomyopathy) ([place])   . HTN (hypertension) 01/07/2015  . Edema 01/06/2015  . Blister of skin without infection 01/05/2015  . Parkinsonian features 12/25/2014  . Weakness 12/18/2014  . Altered mental status 12/11/2014  . UTI (urinary tract infection) 12/02/2014  . GERD (gastroesophageal reflux disease) 12/02/2014  . Psychosis 12/02/2014  . Renal failure (ARF), acute on chronic (HCC) 11/24/2014  . MDD (major depressive disorder) ([place]) 11/24/2014  . Pressure ulcer 11/24/2014  . Elevated troponin 11/23/2014   Past Medical History:  Past Medical History  Diagnosis Date  . Hypertension   . Alzheimer disease   . Fibromyalgia   . Atrial fibrillation ([place])   . Colon polyp   . GERD (gastroesophageal reflux disease)   . Diabetes mellitus without complication ([place])   . Neuropathy ([place])   . HOCM (hypertrophic obstructive cardiomyopathy) ([place])   . Chronic kidney disease    Past Surgical History:  Past Surgical History  Procedure Laterality Date  . Shoulder surgery Left     rotator cuff   HPI:  Emma Mcguire is an 81 y.o. female hx of A fib (not on anticoagulation), HTN, DM, dementia initially presenting to ED with rapid heart rate. In ED found to be in A fib with RVR, had an elevated troponin and concern of sepsis vs SIRS with positive UA. Had MRI brain completed, imaging reviewed, shows a 73mm acute infarct in  the right posterior frontal lobe. Patient recently evaluated by Dr Leta Baptist at [facility] for progressive decline and possible parkinsonism. 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    Assessment / Plan / Recommendation Clinical Impression  Pt demonstrates function consistent with baseline dementia and daughter in law's report of function PTA. The pt is alert, follows 80% of one step commands and can verbalize basic needs with question cues. She does not have a primary language or speech deficit, but sustained attention is impaired at baseline and pt required verbal cues to complete basic verbal tasks. She does not participate in basic functional tasks and has been total assist for the past two months. No acute SLP f/u for cognition is warranted as pt is at her baseline. Pt may benefit from f/u from SLP at [facility].     SLP Assessment  All further Speech Lanaguage Pathology  needs can be addressed in the next venue of care    Follow Up Recommendations  Skilled Nursing facility    Frequency and Duration min 1 x/week      Pertinent Vitals/Pain Pain Assessment: Faces Faces Pain Scale: No hurt   SLP Goals  Progression toward goals: Progressing toward goals  SLP Evaluation Prior Functioning  Cognitive/Linguistic Baseline: Baseline deficits Baseline deficit details: dementia, total assist for ADL's, poorly responsive in pm, limited verbal output Type of Home: [place] Available Help at Discharge: [place]  Overall Cognitive Status: History of cognitive impairments - at baseline Arousal/Alertness: Awake/alert Orientation Level: Oriented to person;Oriented to place;Disoriented to time;Disoriented to situation  Attention: Focused;Sustained Focused Attention: Appears intact Sustained Attention: Impaired Sustained Attention Impairment: Verbal basic;Functional basic Memory: Impaired Memory Impairment: Decreased short term memory Decreased Short  Term Memory: Verbal basic Awareness: Impaired Awareness Impairment: Intellectual impairment Problem Solving: Impaired Problem Solving Impairment: Verbal basic;Functional basic Executive Function: Initiating Initiating: Impaired Initiating Impairment: Verbal basic;Functional basic    Comprehension  Auditory Comprehension Overall Auditory Comprehension: Appears within functional limits for tasks assessed Yes/No Questions: Not tested Commands: Impaired One Step Basic Commands: 75-100% accurate Conversation: Simple Interfering Components: Attention;Processing speed;Working Curator: Within [employer] Reading Comprehension Reading Status: Not tested    Expression Verbal Expression Overall Verbal Expression: Appears within functional limits for tasks assessed Initiation: No impairment Automatic Speech: Counting;Name;Social Response Level of Generative/Spontaneous Verbalization: Phrase;Word Repetition: No impairment Naming: No impairment Pragmatics:  (flat affect) Interfering Components: Attention;Premorbid deficit Written Expression Dominant Hand: Right Written Expression: Not tested   Oral / Motor Oral Motor/Sensory Function Overall Oral Motor/Sensory Function: Other (comment) (no focal weakness, doesnt follow all oral motor commands) Motor Speech Overall Motor Speech: Appears within functional limits for tasks assessed   GO    Herbie [place], MA CCC-SLP [phone]  Lynann Beaver 01/12/2015, 9:18 AM

## 2015-01-12 NOTE — Progress Notes (Signed)
Attempt to in and out cath X3 unsuccessful Dr Sherral Hammers paged and informed

## 2015-01-12 NOTE — ED Notes (Signed)
IV team unable to gain secondary access, if pt loses IV will likely need MD to place line.

## 2015-01-12 NOTE — Progress Notes (Signed)
ANTICOAGULATION CONSULT NOTE - Initial Consult  Pharmacy Consult for Heparin Indication: chest pain/ACS  Allergies  Allergen Reactions  . Cymbalta [Duloxetine Hcl] Other (See Comments)    Listed on MAR  . Lipitor [Atorvastatin] Other (See Comments)    Listed on MAR  . Metformin And Related Other (See Comments)    Listed on MAR  . Simvastatin Other (See Comments)    Listed on MAR  . Verapamil     Sinus arrest    Patient Measurements:    Ht: 69 in  Wt: 82 kg  IBW: 66 kg Heparin Dosing Weight: 82 kg  Vital Signs: Temp: 97.5 F (36.4 C) (11/03 2057) Temp Source: Oral (11/03 2057) BP: 113/89 mmHg (11/03 2245) Pulse Rate: 146 (11/03 2245)  Labs:  Recent Labs  01/11/15 2200  HGB 14.6  HCT 44.4  PLT 286  CREATININE 1.01*    CrCl cannot be calculated (Unknown ideal weight.).   Medical History: Past Medical History  Diagnosis Date  . Hypertension   . Alzheimer disease   . Fibromyalgia   . Atrial fibrillation ([place])   . Colon polyp   . GERD (gastroesophageal reflux disease)   . Diabetes mellitus without complication ([place])   . Neuropathy ([place])   . HOCM (hypertrophic obstructive cardiomyopathy) ([place])   . Chronic kidney disease     Medications:  See electronic med rec  Assessment: 81 y.o. F presents with afib. Trop up to 0.11. To begin heparin for NSTEMI and afib. CBC ok on admission. SCr 1.01, est CrCl 45 ml/min.  Goal of Therapy:  Heparin level 0.3-0.7 units/ml Monitor platelets by anticoagulation protocol: Yes   Plan:  Heparin IV bolus 4000 units Heparin gtt at 1150 units/hr Will f/u heparin level in 8 hours Daily heparin level and CBC  Sherlon Handing, PharmD, BCPS Clinical pharmacist, pager [phone] 01/12/2015,12:31 AM

## 2015-01-12 NOTE — Progress Notes (Signed)
Patient unable to void on bedpan and has not voided today bladder scan over 407 ccs Dr Sherral Hammers informed and orders received to in and out cath.

## 2015-01-12 NOTE — ED Notes (Signed)
Stopped amiodarone at this time per Dr. Blaine Hamper, pt continues to maintain NSR

## 2015-01-12 NOTE — ED Notes (Signed)
Per nursing home paperwork, pt on puree diet and needs meds crushed. Consulted Ben in pharmacy regarding lopressor that cannot be crushed. See new orders.

## 2015-01-12 NOTE — Evaluation (Signed)
Physical Therapy Evaluation Patient Details Name: Emma Mcguire MRN: [id number] DOB: 1927-04-16 Today's Date: 01/12/2015   History of Present Illness  Patient is a 81 y/o female presents from nursing home with rapid heart and found to be in A. fib with RVR with HR 150 to 200s. MRI-29mm acute infarct in the right posterior frontal lobe.  PMH includes drug-induced parkinsonism, Alzhemier's, DM, fibromyalgia, neuropathy, HTN, A-fib, CKD.  Clinical Impression  Patient presents with functional limitations due to deficits listed in PT problem list (see below). Pt tolerated sitting EOB with support and this session focused on facilitating trunk musculature for upright posture/balance. Able to engage with therapists and initiate mobility however fatigues easily. Weakness noted throughout. Will follow acutely to maximize mobility, ease burden of care and improve quality of life.    Follow Up Recommendations SNF    Equipment Recommendations  None recommended by PT    Recommendations for Other Services       Precautions / Restrictions Precautions Precautions: Fall Restrictions Weight Bearing Restrictions: No      Mobility  Bed Mobility Overal bed mobility: Needs Assistance Bed Mobility: Supine to Sit;Sit to Supine     Supine to sit: +2 for physical assistance;Total assist;HOB elevated Sit to supine: +2 for physical assistance;Total assist;HOB elevated   General bed mobility comments: Assist with LEs, bottom and elevating trunk. Pt with left lateral lean.  Transfers Overall transfer level:  (Deferred due to safety concerns, poor sitting balance. )                  Ambulation/Gait                Stairs            Wheelchair Mobility    Modified Rankin (Stroke Patients Only) Modified Rankin (Stroke Patients Only) Pre-Morbid Rankin Score: Severe disability (not sure of PLOF?) Modified Rankin: Severe disability     Balance Overall balance assessment: Needs  assistance Sitting-balance support: Feet supported;No upper extremity supported Sitting balance-Leahy Scale: Zero Sitting balance - Comments: Required assist to maintain static/dynamic sitting balance. Left lateral trunk lean. Able to initiate right lateral trunk flexors with manual cues for upright posture however fatigues. No righting reactions noted with LOB. Tolerated AAROM BLEs in sitting with support.  Postural control: Left lateral lean                                   Pertinent Vitals/Pain Pain Assessment: Faces Faces Pain Scale: No hurt    Home Living Family/patient expects to be discharged to:: Skilled nursing facility                      Prior Function Level of Independence: Needs assistance      ADL's / Homemaking Assistance Needed: Per MD notes, total A for ADls.   Comments: Pt not able to provide accurate PLOF/history due to Alzhiemer's. Per PT notes from ~1 month ago, pt was ambulating to dining hall when living in ALF however since September has been in SNF.     Hand Dominance        Extremity/Trunk Assessment   Upper Extremity Assessment: Defer to OT evaluation           Lower Extremity Assessment: RLE deficits/detail;LLE deficits/detail;Difficult to assess due to impaired cognition RLE Deficits / Details: Increased stiffness throughout RLE. Grossly ~1-2-/5 throughout LE musculature.  LLE Deficits /  Details: Increased stiffness throughout LLE. Grossly ~1-2-/5 throughout LE musculature.     Communication   Communication: No difficulties  Cognition Arousal/Alertness: Awake/alert (Intiially required manual assist to open eye lids.) Behavior During Therapy: Flat affect Overall Cognitive Status: History of cognitive impairments - at baseline (A& Ox4, able to state date, hospital and reason for being admitted. )                      General Comments      Exercises        Assessment/Plan    PT Assessment Patient  needs continued PT services  PT Diagnosis Generalized weakness   PT Problem List Decreased strength;Decreased cognition;Decreased range of motion;Decreased mobility;Decreased balance;Decreased activity tolerance;Impaired tone  PT Treatment Interventions Functional mobility training;Therapeutic activities;Therapeutic exercise;Wheelchair mobility training;Patient/family education;Neuromuscular re-education;Gait training   PT Goals (Current goals can be found in the Care Plan section) Acute Rehab PT Goals Patient Stated Goal: none stated PT Goal Formulation: Patient unable to participate in goal setting Time For Goal Achievement: 01/26/15 Potential to Achieve Goals: Fair    Frequency Min 2X/week   Barriers to discharge        Co-evaluation PT/OT/SLP Co-Evaluation/Treatment: Yes Reason for Co-Treatment: For patient/therapist safety PT goals addressed during session: Mobility/safety with mobility;Strengthening/ROM         End of Session   Activity Tolerance: Patient limited by fatigue Patient left: in bed;with call bell/phone within reach;with bed alarm set Nurse Communication: Mobility status;Need for lift equipment         Time: 7579-7282 PT Time Calculation (min) (ACUTE ONLY): 35 min   Charges:   PT Evaluation $Initial PT Evaluation Tier I: 1 Procedure     PT G Codes:        Madysin Crisp A Lanyah Spengler 01/12/2015, 5:04 PM Wray Kearns, [place], DPT [phone]

## 2015-01-12 NOTE — ED Notes (Signed)
RN attempted phlebotomy stick x2, unable to obtain.

## 2015-01-12 NOTE — Progress Notes (Signed)
EEG Completed; Results Pending  

## 2015-01-12 NOTE — Plan of Care (Signed)
Problem: Consults Goal: RH STROKE PATIENT EDUCATION See Patient Education module for education specifics  Outcome: Progressing Stroke booklet at bedside.

## 2015-01-12 NOTE — Progress Notes (Signed)
Pt had episode of incontinence. Large void on bed pad. Urine laying between legs. Did not in-and-out cath patient at this time due to her voiding. Will continue to monitor urine output.

## 2015-01-12 NOTE — Procedures (Signed)
ELECTROENCEPHALOGRAM REPORT  Date of Study: 01/12/2015  Patient's Name: Emma Mcguire MRN: [id number] Date of Birth: 01/14/1928  Referring Provider: Dr. Burnetta Sabin  Clinical History: This is a 81 year old woman with right posterior frontal stroke and progressive decline.   Medications: acetaminophen (TYLENOL) tablet 650 mg amiodarone (NEXTERONE PREMIX) 360 MG/200ML (1.8 mg/mL) IV infusion aspirin chewable tablet 324 mg docusate sodium (COLACE) capsule 100 mg enoxaparin (LOVENOX) injection 40 mg escitalopram (LEXAPRO) tablet 10 mg fluticasone (FLONASE) 50 MCG/ACT nasal spray 2 spray hydrALAZINE (APRESOLINE) injection 5 mg insulin aspart (novoLOG) injection 0-9 Units magnesium hydroxide (MILK OF MAGNESIA) suspension 30 mL metoprolol tartrate (LOPRESSOR) 25 mg/10 mL oral suspension 37.5 mg pantoprazole (PROTONIX) EC tablet 20 mg vitamin B-12 (CYANOCOBALAMIN) tablet 1,000 mcg  Technical Summary: A multichannel digital EEG recording measured by the international 10-20 system with electrodes applied with paste and impedances below 5000 ohms performed as portable with EKG monitoring in an awake and asleep patient.  Hyperventilation and photic stimulation were not performed.  The digital EEG was referentially recorded, reformatted, and digitally filtered in a variety of bipolar and referential montages for optimal display.   Description: The patient is awake and asleep during the recording.  During maximal wakefulness, there is a symmetric, medium voltage 7 Hz posterior dominant rhythm that attenuates with eye opening. This is admixed with a moderate amount of diffuse 4-6 Hz theta and 2-3 Hz delta slowing of the waking background.  During drowsiness and sleep, there is an increase in theta and delta slowing of the background. Normal sleep architecture is not seen. Hyperventilation and photic stimulation were not performed.  There were no epileptiform discharges or electrographic seizures  seen.    EKG lead showed irregular rhythm.  Impression: This awake and asleep EEG is abnormal due to moderate diffuse slowing of the waking background.  Clinical Correlation of the above findings indicates diffuse cerebral dysfunction that is non-specific in etiology and can be seen with hypoxic/ischemic injury, toxic/metabolic encephalopathies, neurodegenerative disorders, or medication effect.  The absence of epileptiform discharges does not rule out a clinical diagnosis of epilepsy.  Clinical correlation is advised.   Ellouise Newer, M.D.

## 2015-01-12 NOTE — Progress Notes (Signed)
PT Cancellation Note  Patient Details Name: Emma Mcguire MRN: [id number] DOB: 1927/08/08   Cancelled Treatment:    Reason Eval/Treat Not Completed: Patient not medically ready Holding PT evaluation as pt on strict bedrest. Will await increase in activity orders prior to initiation of PT evaluation.   Marguarite Arbour A Bedelia Pong 01/12/2015, 9:04 AM Wray Kearns, PT, DPT [phone]

## 2015-01-13 ENCOUNTER — Inpatient Hospital Stay (HOSPITAL_COMMUNITY): Payer: Medicare Other

## 2015-01-13 DIAGNOSIS — G219 Secondary parkinsonism, unspecified: Secondary | ICD-10-CM | POA: Insufficient documentation

## 2015-01-13 DIAGNOSIS — F419 Anxiety disorder, unspecified: Secondary | ICD-10-CM | POA: Insufficient documentation

## 2015-01-13 DIAGNOSIS — I10 Essential (primary) hypertension: Secondary | ICD-10-CM | POA: Insufficient documentation

## 2015-01-13 DIAGNOSIS — F411 Generalized anxiety disorder: Secondary | ICD-10-CM

## 2015-01-13 DIAGNOSIS — G2119 Other drug induced secondary parkinsonism: Secondary | ICD-10-CM

## 2015-01-13 DIAGNOSIS — E785 Hyperlipidemia, unspecified: Secondary | ICD-10-CM

## 2015-01-13 DIAGNOSIS — F329 Major depressive disorder, single episode, unspecified: Secondary | ICD-10-CM | POA: Insufficient documentation

## 2015-01-13 DIAGNOSIS — F29 Unspecified psychosis not due to a substance or known physiological condition: Secondary | ICD-10-CM | POA: Insufficient documentation

## 2015-01-13 DIAGNOSIS — F32A Depression, unspecified: Secondary | ICD-10-CM | POA: Insufficient documentation

## 2015-01-13 LAB — COMPREHENSIVE METABOLIC PANEL
ALT: 35 U/L (ref 14–54)
ANION GAP: 7 (ref 5–15)
AST: 32 U/L (ref 15–41)
Albumin: 2.1 g/dL — ABNORMAL LOW (ref 3.5–5.0)
Alkaline Phosphatase: 99 U/L (ref 38–126)
BUN: 9 mg/dL (ref 6–20)
CHLORIDE: 111 mmol/L (ref 101–111)
CO2: 20 mmol/L — ABNORMAL LOW (ref 22–32)
Calcium: 8.8 mg/dL — ABNORMAL LOW (ref 8.9–10.3)
Creatinine, Ser: 0.76 mg/dL (ref 0.44–1.00)
GFR calc non Af Amer: >60 mL/min (ref >60)
Glucose, Bld: 106 mg/dL — ABNORMAL HIGH (ref 65–99)
POTASSIUM: 3 mmol/L — AB (ref 3.5–5.1)
SODIUM: 138 mmol/L (ref 135–145)
Total Bilirubin: 0.8 mg/dL (ref 0.3–1.2)
Total Protein: 4.8 g/dL — ABNORMAL LOW (ref 6.5–8.1)

## 2015-01-13 LAB — URINE CULTURE: Culture: NO GROWTH

## 2015-01-13 LAB — GLUCOSE, CAPILLARY
GLUCOSE-CAPILLARY: 112 mg/dL — AB (ref 65–99)
GLUCOSE-CAPILLARY: 143 mg/dL — AB (ref 65–99)
Glucose-Capillary: 104 mg/dL — ABNORMAL HIGH (ref 65–99)
Glucose-Capillary: 95 mg/dL (ref 65–99)

## 2015-01-13 LAB — CBC WITH DIFFERENTIAL/PLATELET
BASOS ABS: 0 10*3/uL (ref 0.0–0.1)
Basophils Relative: 0 %
Eosinophils Absolute: 0.1 10*3/uL (ref 0.0–0.7)
Eosinophils Relative: 1 %
HCT: 35.4 % — ABNORMAL LOW (ref 36.0–46.0)
Hemoglobin: 11.5 g/dL — ABNORMAL LOW (ref 12.0–15.0)
LYMPHS ABS: 1.2 10*3/uL (ref 0.7–4.0)
Lymphocytes Relative: 12 %
MCH: 29.4 pg (ref 26.0–34.0)
MCHC: 32.5 g/dL (ref 30.0–36.0)
MCV: 90.5 fL (ref 78.0–100.0)
MONOS PCT: 6 %
Monocytes Absolute: 0.6 10*3/uL (ref 0.1–1.0)
NEUTROS ABS: 8.3 10*3/uL — AB (ref 1.7–7.7)
NEUTROS PCT: 81 %
PLATELETS: 236 10*3/uL (ref 150–400)
RBC: 3.91 MIL/uL (ref 3.87–5.11)
RDW: 14.3 % (ref 11.5–15.5)
WBC: 10.3 10*3/uL (ref 4.0–10.5)

## 2015-01-13 LAB — MAGNESIUM: MAGNESIUM: 1.3 mg/dL — AB (ref 1.7–2.4)

## 2015-01-13 MED ORDER — ATORVASTATIN CALCIUM 10 MG PO TABS
10.0000 mg | ORAL_TABLET | Freq: Every day | ORAL | Status: DC
  Administered 2015-01-13 – 2015-01-14 (×2): 10 mg via ORAL
  Filled 2015-01-13 (×2): qty 1

## 2015-01-13 NOTE — NC FL2 (Signed)
[place] LEVEL OF CARE SCREENING TOOL     IDENTIFICATION  Patient Name: Emma Mcguire Birthdate: [birth year] Sex: female Admission Date (Current Location): 01/11/2015  [place] and [place] Number:  [facility] )   Facility and Address:  [facility] [place], [place] [address], [place], [place] [address]      Provider Number: [id number]  Attending Physician Name and Address:  Allie Bossier, MD  Relative Name and Phone Number:   Julita Ozbun [phone])    Current Level of Care: Hospital Recommended Level of Care: [place] Prior Approval Number:    Date Approved/Denied:   PASRR Number: [id number]  Discharge Plan:  (ALF)    Current Diagnoses: Patient Active Problem List   Diagnosis Date Noted  . Secondary parkinsonism ([place])   . Atrial fibrillation ([place]) 01/12/2015  . Sepsis ([place]) 01/12/2015  . Ischemic stroke ([place]) 01/12/2015  . Acute encephalopathy   . Paroxysmal atrial fibrillation (HCC)   . Schizophrenia ([place])   . Atrial fibrillation with RVR ([place]) 01/11/2015  . SVT (supraventricular tachycardia) ([place]) 01/11/2015  . Alzheimer disease   . Diabetes mellitus without complication ([place])   . HOCM (hypertrophic obstructive cardiomyopathy) ([place])   . HTN (hypertension) 01/07/2015  . Edema 01/06/2015  . Blister of skin without infection 01/05/2015  . Parkinsonian features 12/25/2014  . Weakness 12/18/2014  . Altered mental status 12/11/2014  . UTI (urinary tract infection) 12/02/2014  . GERD (gastroesophageal reflux disease) 12/02/2014  . Psychosis 12/02/2014  . Renal failure (ARF), acute on chronic (HCC) 11/24/2014  . MDD (major depressive disorder) ([place]) 11/24/2014  . Pressure ulcer 11/24/2014  . Elevated troponin 11/23/2014    Orientation ACTIVITIES/SOCIAL BLADDER RESPIRATION    Place, Self  Family supportive Incontinent Normal  BEHAVIORAL SYMPTOMS/MOOD NEUROLOGICAL BOWEL NUTRITION STATUS   (None )  (None)  Incontinent Diet (DYS 1)  PHYSICIAN VISITS COMMUNICATION OF NEEDS Height & Weight Skin    Verbally 5\' 5"  (165.1 cm) 181 lbs. PU Stage and Appropriate Care PU Stage 1 Dressing: No Dressing        AMBULATORY STATUS RESPIRATION    Assist extensive Normal      Personal Care Assistance Level of Assistance  Total care       Total Care Assistance: Maximum assistance    Functional Limitations Info   (None )             SPECIAL CARE FACTORS FREQUENCY  PT (By licensed PT), OT (By licensed OT)     PT Frequency: 2 OT Frequency: 2           Additional Factors Info  Code Status, Allergies Code Status Info: DNR Allergies Info: Cymbalta, Lipitor, Metformin And Related, Simvastatin, Verapamil           Current Medications (01/13/2015): Current Facility-Administered Medications  Medication Dose Route Frequency Provider Last Rate Last Dose  . 0.9 %  sodium chloride infusion   Intravenous Continuous Ivor Costa, MD 100 mL/hr at 01/13/15 1100    . acetaminophen (TYLENOL) tablet 650 mg  650 mg Oral Q6H PRN Ivor Costa, MD       Or  . acetaminophen (TYLENOL) suppository 650 mg  650 mg Rectal Q6H PRN Ivor Costa, MD      . amiodarone (NEXTERONE PREMIX) 360 MG/200ML (1.8 mg/mL) IV infusion  30 mg/hr Intravenous Continuous Ivor Costa, MD   30 mg/hr at 01/12/15 [phone]  . aspirin chewable tablet 324 mg  324 mg Oral Daily Soledad Gerlach  Blaine Hamper, MD   324 mg at 01/13/15 1054  . docusate sodium (COLACE) capsule 100 mg  100 mg Oral Daily Ivor Costa, MD   100 mg at 01/13/15 1054  . enoxaparin (LOVENOX) injection 40 mg  40 mg Subcutaneous Q24H Donzetta Starch, NP   40 mg at 01/13/15 1054  . escitalopram (LEXAPRO) tablet 10 mg  10 mg Oral Daily Ivor Costa, MD   10 mg at 01/13/15 1054  . feeding supplement (ENSURE ENLIVE) (ENSURE ENLIVE) liquid 237 mL  237 mL Oral BID BM Rogue Bussing, RD   237 mL at 01/13/15 1052  . fluticasone (FLONASE) 50 MCG/ACT nasal spray 2 spray  2 spray Each Nare Daily Ivor Costa, MD   2  spray at 01/13/15 1000  . hydrALAZINE (APRESOLINE) injection 5 mg  5 mg Intravenous Q2H PRN Ivor Costa, MD      . insulin aspart (novoLOG) injection 0-9 Units  0-9 Units Subcutaneous TID WC Ivor Costa, MD   1 Units at 01/13/15 1153  . magnesium hydroxide (MILK OF MAGNESIA) suspension 30 mL  30 mL Oral Daily PRN Ivor Costa, MD      . metoprolol tartrate (LOPRESSOR) 25 mg/10 mL oral suspension 37.5 mg  37.5 mg Per Tube BID Ivor Costa, MD   37.5 mg at 01/13/15 1054  . multivitamin with minerals tablet 1 tablet  1 tablet Oral Daily Ivor Costa, MD   1 tablet at 01/13/15 1053  . ondansetron (ZOFRAN) tablet 4 mg  4 mg Oral Q6H PRN Ivor Costa, MD       Or  . ondansetron [facility]) injection 4 mg  4 mg Intravenous Q6H PRN Ivor Costa, MD      . pantoprazole (PROTONIX) EC tablet 20 mg  20 mg Oral Daily Ivor Costa, MD   20 mg at 01/13/15 1054  . sodium chloride 0.9 % injection 3 mL  3 mL Intravenous Q12H Ivor Costa, MD   3 mL at 01/12/15 2122  . vitamin B-12 (CYANOCOBALAMIN) tablet 1,000 mcg  1,000 mcg Oral Daily Ivor Costa, MD   1,000 mcg at 01/13/15 1054   Do not use this list as official medication orders. Please verify with discharge summary.  Discharge Medications:   Medication List    ASK your doctor about these medications        amLODipine 5 MG tablet  Commonly known as:  NORVASC  Take 5 mg by mouth daily.     aspirin 81 MG chewable tablet  Chew 81 mg by mouth daily.     docusate sodium 100 MG capsule  Commonly known as:  COLACE  Take 1 capsule (100 mg total) by mouth daily.     escitalopram 5 MG tablet  Commonly known as:  LEXAPRO  Take 10 mg by mouth daily.     fluticasone 50 MCG/ACT nasal spray  Commonly known as:  FLONASE  Place 2 sprays into both nostrils daily.     loxapine 10 MG capsule  Commonly known as:  LOXITANE  Take 10 mg by mouth daily.     magnesium hydroxide 400 MG/5ML suspension  Commonly known as:  MILK OF MAGNESIA  Take 30 mLs by mouth daily as needed for mild  constipation.     metoprolol succinate 50 MG 24 hr tablet  Commonly known as:  TOPROL-XL  Take 75 mg by mouth daily. Take with or immediately following a meal.     pantoprazole 20 MG tablet  Commonly known as:  PROTONIX  Take 20 mg by mouth daily.     THERA Tabs  Take 1 tablet by mouth daily.     vitamin B-12 1000 MCG tablet  Commonly known as:  CYANOCOBALAMIN  Take 1,000 mcg by mouth daily.        Relevant Imaging Results:  Relevant Lab Results:  Recent Labs    Additional Information SS# [id number]  Glendon Axe, MSW, LCSWA [phone] 01/13/2015 12:10 PM

## 2015-01-13 NOTE — Clinical Social Work Note (Signed)
CSW consult acknowledged:  Clinical Social Worker received a consult indicating patient was admitted from facility. CSW has reviewed chart and noted patient is a long-term care resident of ALF, [facility] however possibly admitted from short-term rehab facility: [facility].   CSW attempted to contact family members listed and left voice message from returned phone call. Patient is currently disoriented. CSW remains available as needed.   Glendon Axe, MSW, LCSWA [phone] 01/13/2015 11:13 AM

## 2015-01-13 NOTE — Progress Notes (Signed)
STROKE TEAM PROGRESS NOTE   HISTORY Emma Mcguire is an 81 y.o. female hx of A fib (not on anticoagulation), HTN, DM, dementia who initially presenting to ED with rapid heart rate. In ED found to be in A fib with RVR, had an elevated troponin and concern of sepsis vs SIRS with positive UA. Had MRI brain completed, imaging reviewed, shows a 64mm acute infarct in the right posterior frontal lobe.  Patient recently evaluated by Dr Leta Baptist at [facility] for progressive decline and possible parkinsonism. 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   Date last known well: unclear Time last known well: unclear tPA Given: no, outside tpA window, unclear LSW Modified Rankin: Rankin Score=4   SUBJECTIVE (INTERVAL HISTORY) Her family was not at the bedside.  Overall she feels her condition is unchanged. She reports poor appetite and feels thirsty otherwise no complaints on ROS   OBJECTIVE Temp:  [98 F (36.7 C)-98.8 F (37.1 C)] 98 F (36.7 C) (11/05 0500) Pulse Rate:  [27-106] 106 (11/05 0500) Cardiac Rhythm:  [-] Atrial fibrillation (11/05 0400) Resp:  [14-24] 17 (11/05 0500) BP: (101-172)/(44-87) 126/44 mmHg (11/05 0500) SpO2:  [51 %-100 %] 98 % (11/05 0500)  CBC:  Recent Labs Lab 01/11/15 2200 01/12/15 0543  WBC 12.7* 10.8*  HGB 14.6 12.3  HCT 44.4 36.0  MCV 91.4 89.8  PLT 286 425    Basic Metabolic Panel:  Recent Labs Lab 01/11/15 2200 01/12/15 0543  NA 135 138  K 3.9 3.4*  CL 103 106  CO2 20* 22  GLUCOSE 181* 147*  BUN 18 16  CREATININE 1.01* 0.97  CALCIUM 9.6 8.9    Lipid Panel:    Component Value Date/Time   CHOL 217* 01/12/2015 0543   TRIG 207* 01/12/2015 0543   HDL 20* 01/12/2015 0543   CHOLHDL 10.9 01/12/2015 0543   VLDL 41* 01/12/2015 0543   LDLCALC 156* 01/12/2015 0543   HgbA1c: No results found for: HGBA1C Urine Drug Screen: No results found for: LABOPIA, COCAINSCRNUR, LABBENZ, AMPHETMU, THCU, LABBARB    IMAGING  Mr Brain Wo  Contrast 01/12/2015   7 mm acute infarct RIGHT posterior frontal lobe. Chronic changes including severe chronic small vessel ischemic disease and old bilateral basal ganglia/ thalamus lacunar infarcts, tiny inferior cerebellar lacunar infarcts.   Dg Chest Portable 1 View 01/11/2015   No acute cardiopulmonary disease. Left base scarring with volume loss.   PHYSICAL EXAM Physical Exam General - Well nourished, well developed, in NAD; marked resting tremor in upper and lower extremities   Cardiovascular - irregular rate and rhythm; 3/5 murmur Pulmonary: CTA Abdomen: NT, ND, normal bowel sounds Extremities: No C/C/E  Neurological Exam Mental Status: Normal; awakens easily and follows commands Orientation:  Oriented to person, place and time Speech:  Fluent; no dysarthria Cranial Nerves:  PERRL; EOMI; visual fields full, face grossly symmetric, hearing grossly intact; shrug symmetric and tongue midline Motor Exam:  Tone:  Cogwheel rigidity in the UEs; increased tone in LEs  Strength: weak throughout; unclear what is baseline.  3/5 in lower extremities and 2/5 in lower extremities; patient reports walking wit cane and walker at home and states her choice of device is determined by how well she is walking at the time  Sensory: Intact to light touch throughout  Coordination:  Could not test due to weakness in arms  Gait: Deferred  ASSESSMENT/PLAN Ms. Emma Mcguire is a 81 y.o. female with history of hypertension, Alzheimer's dementia, fibromyalgia, atrial fibrillation not anticoagulated, diabetes mellitus,  possible parkinsonism, chronic kidney disease, and hypertrophic obstructive cardiomyopathy  presenting with atrial fibrillation with rapid ventricular response, elevated troponin enzymes and possible sepsis . She did not receive IV t-PA due to late presentation.  Stroke:  Non-dominant infarct probably embolic secondary to atrial fibrillation.  Resultant  confusion  MRI - 7 mm  acute infarct RIGHT posterior frontal lobe  MRA - not performed  Carotid Doppler - 1-39% internal carotid artery stenosis bilaterally. The right vertebral artery is patent with antegrade flow. Unable to visualize the left vertebral artery.  2D Echo - EF 60-65%.  LDL - 156  HgbA1c pending  VTE prophylaxis - Lovenox  DIET - DYS 1 Room service appropriate?: Yes; Fluid consistency:: Thin  aspirin 81 mg daily prior to admission, now on aspirin 325 mg daily  Patient counseled to be compliant with her antithrombotic medications  Ongoing aggressive stroke risk factor management  Therapy recommendations: - Skilled nursing facility recommended  Disposition: Pending  Hypertension  Stable  Permissive hypertension (OK if < 220/120) but gradually normalize in 5-7 days  Hyperlipidemia  Home meds: No lipid lowering medications prior to admission  LDL 156, goal < 70  Statin allergy  Diabetes  HgbA1c pending, goal < 7.0  Uncontrolled  Other Stroke Risk Factors  Advanced age  Obesity, Body mass index is 30.23 kg/(m^2).   Hx stroke/TIA - by MRI  Family hx stroke (mother)  Other Active Problems  Leukocytosis - improving - afebrile  Mild hypokalemia  Discuss possible anticoagulation for atrial fibrillation.  Hospital day # 2  Mikey Bussing [facility] Pager [phone] 01/13/2015, 8:40 AM  NEUROLOGY ATTENDING NOTE Patient was seen and examined by me personally. I reviewed notes, independently viewed imaging studies, participated in medical decision making and plan of care. I have made additions or clarifications directly to the above note.  Documentation accurately reflects findings. The laboratory and radiographic studies were personally reviewed by me.  ROS completed by me personally and pertinent positives fully documented  Assessment and plan completed by me personally and fully documented above.  Condition is unchanged     This  patient with stable neurological findings.  Management is complex given bacteremia, embolic stroke and afib.  Ideally we would hold antiplatelet and anticoagulation therapy in the setting of infective endocarditis.  Patient has no vegetations noted on ECHO.   Agree with ASA for now.  Patient will need to be evaluated for risk of anticoagulation as long term secondary prevention using fall risk assessment, CHAD-VASC and HAS-BLED scores.  Recommend outpatient follow-up with Dr. Leta Baptist, the patient's outpatient neurologist for further discussion of secondary prevention once patient out of acute scenario.  No further recommendations at this time.  I spent 30 minutes in consultative care of  this patient.  SIGNED BY: Dr. Elissa Hefty     To contact Stroke Continuity provider, please refer to [email or web address] After hours, contact General Neurology

## 2015-01-13 NOTE — Clinical Social Work Note (Signed)
Clinical Social Work Assessment  Patient Details  Name: Emma Mcguire MRN: [id number] Date of Birth: Nov 19, 1927  Date of referral:  01/13/15               Reason for consult:  Facility Placement, Discharge Planning                Permission sought to share information with:  Family Supports, Customer service manager, Case Optician, dispensing granted to share information::  Yes, Verbal Permission Granted  Name::      Emma Mcguire)  Agency::   ([place] )  Relationship::   (Daughter )  Sport and exercise psychologist Information:   [phone])  Housing/Transportation Living arrangements for the past 2 months:  [place] of Information:  Adult Children Patient Interpreter Needed:  None Criminal Activity/Legal Involvement Pertinent to Current Situation/Hospitalization:  No - Comment as needed Significant Relationships:  Adult Children Lives with:  Facility Resident Do you feel safe going back to the place where you live?  Yes Need for family participation in patient care:  No (Coment)  Care giving concerns:  Patient admitted from facility: [facility].    Social Worker assessment / plan:  Holiday representative spoke with patient's daughter, Beverlee Nims in regards to patient being admitted from facility. CSW introduced CSW role. Pt's dtr reported/confirmed that patient is a LTC resident of [place]. Pt's dtr also shared that recently in September patient went to [facility] for short-term therapy however patient would NOT participate in therapy which resulted in patient being transitioned back to [facility] sooner. Pt's dtr expressed that she would prefer that pt return to [facility] once medically stable. Patient is currently confused. No further concerns reported at this time. CSW will continue to follow pt and pt's family for continued support and to facilitate pt's discharge needs once medically stable.   Employment status:   Retired Forensic scientist:  Medicare PT Recommendations:  Not assessed at this time Information / Referral to community resources:   (None- admitted from facility)  Patient/Family's Response to care:  Patient disoriented and unable to participate in assessment. Pt's dtr wishes for patient to return to ALF at discharge. Pt's dtr and family involved in care and appreciated social work intervention.  Patient/Family's Understanding of and Emotional Response to Diagnosis, Current Treatment, and Prognosis:  Pt's dtr knowledgeable of medical intervention and continued treatment.   Emotional Assessment Appearance:  Appears stated age Attitude/Demeanor/Rapport:  Unable to Assess Affect (typically observed):  Unable to Assess Orientation:  Oriented to Self Alcohol / Substance use:  Not Applicable Psych involvement (Current and /or in the community):  No (Comment)  Discharge Needs  Concerns to be addressed:  Care Coordination Readmission within the last 30 days:  No Current discharge risk:  None Barriers to Discharge:  Continued Medical Work up   [employer], MSW, LCSWA [phone] 01/13/2015 11:59 AM

## 2015-01-13 NOTE — Progress Notes (Signed)
[place] TEAM 1 - Stepdown/ICU TEAM Progress Note  Emma Mcguire:[id number] DOB: Mar 21, 1927 DOA: 01/11/2015 PCP: Tawanna Solo, MD  Admit HPI / Brief Narrative: 81 y.o. WF PMHx Dementia, Alzheimer's Dz, Fibromyalgia, Depression, Anxiety, Psychosis Atrial Fibrillation not on anticoagulants, Hypertropic Obstructive Cardiomyopathy (HOCM), HTN, diet-controlled DM Type 2, CKD.  Who presents with clammy and rapid heart rate. Patient has dementia. She dose not talk, and is unable to provide medical history, therefore, most of the history is obtained by discussing the case with ED physician, per her daughter-in law. Per her daughter-in-law, patient was living by herself independently before August. She developed hallucination and psychosis in August. Patient was admitted to psych unit at the end of August, and discharged to the nursing home at the beginning of September per her daughter-in-law. Since then, pt became wheelchair bounded. Her mental status has been deteriorating. Today pt was noted by staff in SNF to be cool and clammy and has very rapid heart rate. Pt seems to have no pain any where. No diarrhea. In ED, she was found to have A. fib with RVR with HR 150 to 200s.   In ED, patient was found to have WBC 12.7, troponin 0.11, temperature normal, tachycardia, tachypnea, electrolytes okay, renal functioning okay, negative chest x-ray, pending urinalysis and culture. Patient is admitted to inpatient for further evaluation treatment.   HPI/Subjective: 11/5 A/O 2 (does not know when, why) although does know she had an MRI, follows commands  Assessment/Plan:  Stroke -MRI of brain showed 7 mm acute infarct RIGHT posterior frontal lobe, and old infarcts see MRI scan below . -Patient with residual left hemiparesis -PT/OT/Speech; recommend SNF  Atrial fibrillation with RVR ([place]): CHA2DS2-VASc Score is 5,  -Triggering factor is not clear. Pending UA. -Amiodarone drip DC'd -Patient's rate  still not fully controlled. Metoprolol 37.5 mg BID started. If rate still not well-controlled by tomorrow will start low-dose digoxin -Continue aspirin 325 mg daily, secondary to patient's fall risk would not start full dose anticoagulation   Sepsis  -She meet criteria for sepsis or SIRS with leukocytosis, tachycardia, tachypnea; most likely secondary to patient's A. fib RVR. Will not start antibiotics, watch closely -Resolving.   Elevated trop:  -likely due to demanding ischemia secondary to A. fib with RVR, but cannot rule out ACS. -Troponins resolving  -See A. Fib -  hemoglobin A1c pending    Depression and anxiety:  -Continue home medications: Lexapro  GERD: -Protonix  HTN: -Hold amlodipine -on metoprolol -IV hydralazine when necessary  Diet controlled DM-II:  -Hemoglobin A1c pending  -SSI  HLD -Not within ADA guideline -Unknown allergy to statin, patient unable to elaborate. Given her high risk will start on low-dose Lipitor (Lipitor 10 mg daily), monitor closely  Psychosis: She developed hallucination and psychosis in August. Patient was admitted to psych unit at the end of August. She was started Loxapine. She developed rigidity and therefore her dose of Loxapine was tapered down and finally stopped today. She still has rigidity in both legs. She could be benefited by adding anticholinergic treatment, but given her A. fib with RVR, would not be the choice now. Per daughter-in-law, patient was scheduled for MRI of the brain, but not done yet.     Code Status: FULL Family Communication: no family present at time of exam Disposition Plan: Per neurology    Consultants: Dr.Pramod S Sethi (stroke team)   Procedure/Significant Events: 11/4 MRI brain without contrast;-7 mm acute infarct RIGHT posterior frontal lobe. -Old bilateral basal  ganglia/ thalamus lacunar infarcts, tiny inferior cerebellar lacunar infarcts. 11/4 carotid ultrasound; 1-39% internal carotid  artery stenosis bilaterally. The right vertebral artery is patent with antegrade flow. Unable to visualize the left vertebral artery. 11/14 EEG results pending 11/4 echocardiogram;- Left ventricle: mild LVH. -LVEF=60% to 65%.There was dynamic obstruction. There was dynamic obstruction in the outflow tract.  -(grade 1 diastolicdysfunction).   Culture 10/4 urine pending 10/4 blood pending 10/4 MRSA by PCR negative   Antibiotics: NA  DVT prophylaxis: Lovenox   Devices    LINES / TUBES:      Continuous Infusions: . sodium chloride 100 mL/hr at 01/13/15 1200  . amiodarone      Objective: VITAL SIGNS: Temp: 98 F (36.7 C) (11/05 0500) Temp Source: Axillary (11/05 0500) BP: 174/94 mmHg (11/05 1054) Pulse Rate: 108 (11/05 1054) SPO2; FIO2:   Intake/Output Summary (Last 24 hours) at 01/13/15 1301 Last data filed at 01/13/15 1100  Gross per 24 hour  Intake   1720 ml  Output      0 ml  Net   1720 ml     Exam: General: A/O 2 (does not know when, why) although does know she had an MRI, follows commands, No acute respiratory distress Eyes: Negative headache, eye pain, double vision,negative scleral hemorrhage ENT: Negative Runny nose, negative ear pain, negative gingival bleeding, Neck:  Negative scars, masses, torticollis, lymphadenopathy, JVD Lungs: Clear to auscultation bilaterally without wheezes or crackles Cardiovascular: Irregular irregular rhythm and rate, without murmur gallop or rub normal S1 and S2 Abdomen:negative abdominal pain, nondistended, positive soft, bowel sounds, no rebound, no ascites, no appreciable mass Extremities: No significant cyanosis, clubbing, or edema bilateral lower extremities Psychiatric:  Negative depression, negative anxiety, negative fatigue, negative mania  Neurologic:  Cranial nerves II through XII intact, tongue/uvula midline, RUE muscle strength 3/5, LUE 2-3/5, bilateral lower extremity strength 1-2/5, unable to perform  finger-nose-finger, unable to perform finger touch,   sensation intact throughout,negative dysarthria, negative expressive aphasia, negative receptive aphasia.   Data Reviewed: Basic Metabolic Panel:  Recent Labs Lab 01/11/15 2200 01/12/15 0543  NA 135 138  K 3.9 3.4*  CL 103 106  CO2 20* 22  GLUCOSE 181* 147*  BUN 18 16  CREATININE 1.01* 0.97  CALCIUM 9.6 8.9   Liver Function Tests:  Recent Labs Lab 01/12/15 0543  AST 39  ALT 37  ALKPHOS 96  BILITOT 1.2  PROT 5.0*  ALBUMIN 2.1*   No results for input(s): LIPASE, AMYLASE in the last 168 hours. No results for input(s): AMMONIA in the last 168 hours. CBC:  Recent Labs Lab 01/11/15 2200 01/12/15 0543  WBC 12.7* 10.8*  HGB 14.6 12.3  HCT 44.4 36.0  MCV 91.4 89.8  PLT 286 257   Cardiac Enzymes:  Recent Labs Lab 01/12/15 0100 01/12/15 0620 01/12/15 1240  TROPONINI 0.08* 0.09* 0.06*   BNP (last 3 results) No results for input(s): BNP in the last 8760 hours.  ProBNP (last 3 results) No results for input(s): PROBNP in the last 8760 hours.  CBG:  Recent Labs Lab 01/12/15 0734 01/12/15 1119 01/12/15 1806 01/13/15 0733 01/13/15 1149  GLUCAP 141* 124* 98 112* 143*    Recent Results (from the past 240 hour(s))  Urine culture     Status: None   Collection Time: 01/12/15 12:30 AM  Result Value Ref Range Status   Specimen Description URINE, CATHETERIZED  Final   Special Requests Immunocompromised  Final   Culture NO GROWTH 1 DAY  Final  Report Status 01/13/2015 FINAL  Final  Culture, blood (x 2)     Status: None (Preliminary result)   Collection Time: 01/12/15  5:43 AM  Result Value Ref Range Status   Specimen Description BLOOD LEFT ANTECUBITAL  Final   Special Requests IN PEDIATRIC BOTTLE 5CC  Final   Culture NO GROWTH 1 DAY  Final   Report Status PENDING  Incomplete  MRSA PCR Screening     Status: None   Collection Time: 01/12/15  6:10 AM  Result Value Ref Range Status   MRSA by PCR NEGATIVE  NEGATIVE Final    Comment:        The GeneXpert MRSA Assay (FDA approved for NASAL specimens only), is one component of a comprehensive MRSA colonization surveillance program. It is not intended to diagnose MRSA infection nor to guide or monitor treatment for MRSA infections.   Culture, blood (x 2)     Status: None (Preliminary result)   Collection Time: 01/12/15  6:20 AM  Result Value Ref Range Status   Specimen Description BLOOD LEFT ANTECUBITAL  Final   Special Requests IN PEDIATRIC BOTTLE 3CC  Final   Culture NO GROWTH 1 DAY  Final   Report Status PENDING  Incomplete     Studies:  Recent x-ray studies have been reviewed in detail by the Attending Physician  Scheduled Meds:  Scheduled Meds: . aspirin  324 mg Oral Daily  . docusate sodium  100 mg Oral Daily  . enoxaparin (LOVENOX) injection  40 mg Subcutaneous Q24H  . escitalopram  10 mg Oral Daily  . feeding supplement (ENSURE ENLIVE)  237 mL Oral BID BM  . fluticasone  2 spray Each Nare Daily  . insulin aspart  0-9 Units Subcutaneous TID WC  . metoprolol tartrate  37.5 mg Per Tube BID  . multivitamin with minerals  1 tablet Oral Daily  . pantoprazole  20 mg Oral Daily  . sodium chloride  3 mL Intravenous Q12H  . vitamin B-12  1,000 mcg Oral Daily    Time spent on care of this patient: 40 mins   WOODS, Geraldo Docker , MD  [facility] Office  [phone] Pager - [phone]  On-Call/Text Page:      [email or web address]      password [email or web address]  If 7PM-7AM, please contact night-coverage [email or web address] Password [email or web address] 01/13/2015, 1:01 PM   LOS: 2 days   Care during the described time interval was provided by me .  I have reviewed this patient's available data, including medical history, events of note, physical examination, and all test results as part of my evaluation. I have personally reviewed and interpreted all radiology studies.   Dia Crawford, MD [phone] Pager

## 2015-01-13 NOTE — Clinical Social Work Note (Signed)
Clinical Social Worker has faxed FL-2 and clinicals to [facility] for review. Family would prefer for patient to return.   CSW remains available as needed.   Glendon Axe, MSW, LCSWA [phone] 01/13/2015 5:44 PM

## 2015-01-14 LAB — CBC WITH DIFFERENTIAL/PLATELET
BASOS ABS: 0 10*3/uL (ref 0.0–0.1)
Basophils Relative: 0 %
Eosinophils Absolute: 0.1 10*3/uL (ref 0.0–0.7)
Eosinophils Relative: 1 %
HEMATOCRIT: 35.6 % — AB (ref 36.0–46.0)
HEMOGLOBIN: 11.5 g/dL — AB (ref 12.0–15.0)
LYMPHS PCT: 15 %
Lymphs Abs: 1.4 10*3/uL (ref 0.7–4.0)
MCH: 29.3 pg (ref 26.0–34.0)
MCHC: 32.3 g/dL (ref 30.0–36.0)
MCV: 90.8 fL (ref 78.0–100.0)
Monocytes Absolute: 0.7 10*3/uL (ref 0.1–1.0)
Monocytes Relative: 7 %
NEUTROS ABS: 7.3 10*3/uL (ref 1.7–7.7)
NEUTROS PCT: 77 %
PLATELETS: 278 10*3/uL (ref 150–400)
RBC: 3.92 MIL/uL (ref 3.87–5.11)
RDW: 14.5 % (ref 11.5–15.5)
WBC: 9.4 10*3/uL (ref 4.0–10.5)

## 2015-01-14 LAB — COMPREHENSIVE METABOLIC PANEL
ALT: 31 U/L (ref 14–54)
ANION GAP: 11 (ref 5–15)
AST: 25 U/L (ref 15–41)
Albumin: 2 g/dL — ABNORMAL LOW (ref 3.5–5.0)
Alkaline Phosphatase: 93 U/L (ref 38–126)
BILIRUBIN TOTAL: 1.1 mg/dL (ref 0.3–1.2)
BUN: 7 mg/dL (ref 6–20)
CHLORIDE: 111 mmol/L (ref 101–111)
CO2: 18 mmol/L — ABNORMAL LOW (ref 22–32)
Calcium: 8.8 mg/dL — ABNORMAL LOW (ref 8.9–10.3)
Creatinine, Ser: 0.81 mg/dL (ref 0.44–1.00)
GFR calc Af Amer: >60 mL/min (ref >60)
Glucose, Bld: 134 mg/dL — ABNORMAL HIGH (ref 65–99)
POTASSIUM: 3.4 mmol/L — AB (ref 3.5–5.1)
Sodium: 140 mmol/L (ref 135–145)
TOTAL PROTEIN: 4.9 g/dL — AB (ref 6.5–8.1)

## 2015-01-14 LAB — GLUCOSE, CAPILLARY
GLUCOSE-CAPILLARY: 128 mg/dL — AB (ref 65–99)
Glucose-Capillary: 145 mg/dL — ABNORMAL HIGH (ref 65–99)
Glucose-Capillary: 102 mg/dL — ABNORMAL HIGH (ref 65–99)
Glucose-Capillary: 111 mg/dL — ABNORMAL HIGH (ref 65–99)

## 2015-01-14 LAB — MAGNESIUM: Magnesium: 1.2 mg/dL — ABNORMAL LOW (ref 1.7–2.4)

## 2015-01-14 MED ORDER — PANTOPRAZOLE SODIUM 40 MG PO TBEC
40.0000 mg | DELAYED_RELEASE_TABLET | Freq: Every day | ORAL | Status: DC
  Administered 2015-01-15: 40 mg via ORAL
  Filled 2015-01-14: qty 1

## 2015-01-14 MED ORDER — POTASSIUM CHLORIDE CRYS ER 20 MEQ PO TBCR
40.0000 meq | EXTENDED_RELEASE_TABLET | Freq: Once | ORAL | Status: AC
  Administered 2015-01-14: 40 meq via ORAL
  Filled 2015-01-14: qty 2

## 2015-01-14 MED ORDER — MAGNESIUM SULFATE 2 GM/50ML IV SOLN
2.0000 g | Freq: Once | INTRAVENOUS | Status: AC
  Administered 2015-01-14: 2 g via INTRAVENOUS
  Filled 2015-01-14: qty 50

## 2015-01-14 MED ORDER — POTASSIUM CHLORIDE CRYS ER 20 MEQ PO TBCR
40.0000 meq | EXTENDED_RELEASE_TABLET | Freq: Two times a day (BID) | ORAL | Status: AC
  Administered 2015-01-14 (×2): 40 meq via ORAL
  Filled 2015-01-14 (×2): qty 2

## 2015-01-14 MED ORDER — METOPROLOL TARTRATE 25 MG/10 ML ORAL SUSPENSION
50.0000 mg | Freq: Two times a day (BID) | ORAL | Status: DC
  Filled 2015-01-14 (×2): qty 20

## 2015-01-14 MED ORDER — METOPROLOL TARTRATE 25 MG/10 ML ORAL SUSPENSION
50.0000 mg | Freq: Two times a day (BID) | ORAL | Status: DC
  Administered 2015-01-14 – 2015-01-15 (×2): 50 mg via ORAL
  Filled 2015-01-14 (×3): qty 20

## 2015-01-14 MED ORDER — DIVALPROEX SODIUM 125 MG PO CSDR
250.0000 mg | DELAYED_RELEASE_CAPSULE | Freq: Two times a day (BID) | ORAL | Status: DC
  Administered 2015-01-14 – 2015-01-15 (×3): 250 mg via ORAL
  Filled 2015-01-14 (×5): qty 2

## 2015-01-14 NOTE — Progress Notes (Signed)
Pt BP 179/89, 5mg  IV hydralazine given per PRN orders.  Will continue to monitor closely.

## 2015-01-14 NOTE — Progress Notes (Addendum)
PATIENT DETAILS Name: Emma Mcguire Age: 81 y.o. Sex: female Date of Birth: 1927/10/16 Admit Date: 01/11/2015 Admitting Physician Ivor Costa, MD TZG:YFVCBS,WHQP [name], MD  Subjective: Awake and alert. Follows some commands, appears to be chronically ill-looking and weak.  Assessment/Plan: Acute CVA: In the setting of atrial fibrillation with a CHA2DS2-VASc Score of 6, unfortunately not a candidate for anticoagulation given advanced dementia and fall risk. Recommendations are to continue aspirin 325 mg on discharge. Carotid Doppler did not show any significant stenosis, echo without embolic source and preserved ejection fraction, LDL 156, A1c pending. On exam appears to have generalized weakness throughout-limited exam, but approximately 3/5 strength in the lower extremities. 4/5 in the upper extremities  Atrial fibrillation with RFF:[id number] amiodarone infusion on admission-now rate controlled with metoprolol. Telemetry-unremarkable- not reliable as patient has tremors and has a lot of artifacts. Discontinue telemetry. See above regarding anticoagulation  Essential hypertension: Blood pressure uncontrolled-increase metoprolol to 50 mg twice a day, follow-up and adjust accordingly  Dyslipidemia: LDL 156-noted goal-continue statin.  Elevated troponin: Minimally elevated troponin-likely in the setting of demand ischemia, trend is flat and not consistent with ACS. Manage medically given numerous medical comorbidities.  HOCM: Continue metoprolol  Depression/anxiety: Continue Lexapro  GERD: PPI  ? History of diabetes: CBGs stable with SSI-await A1c. Does not appear to be on any medications for diabetes prior to this admission.  Hypokalemia: Replete both potassium and magnesium and recheck in a.m.  Dysphagia: Suspect multifactorial from dementia/generalized weakness and acute CVA. Continue with dysphagia 1 diet-will discuss risks of aspiration with family when they  arrive.  Dementia: Stable-pleasantly confused today. Apparently was on loxapine-which has now been discontinued because of rigidity. Will start Depakote.  Obesity:  Disposition: Remain inpatient-SNF on 11/7  Antimicrobial agents  See below  Anti-infectives    None      DVT Prophylaxis: Prophylactic Lovenox  Code Status: DNR  Family Communication None at bedside  Procedures: None  CONSULTS:  neurology  Time spent 30 minutes-Greater than 50% of this time was spent in counseling, explanation of diagnosis, planning of further management, and coordination of care.  MEDICATIONS: Scheduled Meds: . aspirin  324 mg Oral Daily  . atorvastatin  10 mg Oral q1800  . docusate sodium  100 mg Oral Daily  . enoxaparin (LOVENOX) injection  40 mg Subcutaneous Q24H  . escitalopram  10 mg Oral Daily  . feeding supplement (ENSURE ENLIVE)  237 mL Oral BID BM  . fluticasone  2 spray Each Nare Daily  . insulin aspart  0-9 Units Subcutaneous TID WC  . metoprolol tartrate  37.5 mg Per Tube BID  . multivitamin with minerals  1 tablet Oral Daily  . pantoprazole  20 mg Oral Daily  . sodium chloride  3 mL Intravenous Q12H  . vitamin B-12  1,000 mcg Oral Daily   Continuous Infusions: . sodium chloride 100 mL/hr at 01/14/15 0900   PRN Meds:.acetaminophen **OR** acetaminophen, hydrALAZINE, magnesium hydroxide, ondansetron **OR** ondansetron (ZOFRAN) IV    PHYSICAL EXAM: Vital signs in last 24 hours: Filed Vitals:   01/14/15 0400 01/14/15 0810 01/14/15 0915 01/14/15 0916  BP: 168/77 186/93 186/93 186/93  Pulse: 99 103 108   Temp: 99.1 F (37.3 C) 98.5 F (36.9 C)    TempSrc:  Oral    Resp: 28 26    Height:      Weight: 81.012 kg (178 lb 9.6 oz)  SpO2: 97% 96%      Weight change:  Filed Weights   01/12/15 0620 01/14/15 0400  Weight: 82.4 kg (181 lb 10.5 oz) 81.012 kg (178 lb 9.6 oz)   Body mass index is 29.72 kg/(m^2).   Gen Exam: Awake  clear speech. Follows some  command  Neck: Supple, No JVD.   Chest: B/L Clear.  CVS: S1 S2 irregular Abdomen: soft, BS +, non tender, non distended.  Extremities: no edema, lower extremities warm to touch. Neurologic: generalized weakness throughout-limited exam, but approximately 3/5 strength in the lower extremities. 4/5 in the upper extremities Skin: No Rash.   Wounds: N/A.    Intake/Output from previous day:  Intake/Output Summary (Last 24 hours) at 01/14/15 1020 Last data filed at 01/14/15 0900  Gross per 24 hour  Intake 1993.33 ml  Output      0 ml  Net 1993.33 ml     LAB RESULTS: CBC  Recent Labs Lab 01/11/15 2200 01/12/15 0543 01/13/15 1420 01/14/15 0154  WBC 12.7* 10.8* 10.3 9.4  HGB 14.6 12.3 11.5* 11.5*  HCT 44.4 36.0 35.4* 35.6*  PLT 286 257 236 278  MCV 91.4 89.8 90.5 90.8  MCH 30.0 30.7 29.4 29.3  MCHC 32.9 34.2 32.5 32.3  RDW 14.1 14.2 14.3 14.5  LYMPHSABS  --   --  1.2 1.4  MONOABS  --   --  0.6 0.7  EOSABS  --   --  0.1 0.1  BASOSABS  --   --  0.0 0.0    Chemistries   Recent Labs Lab 01/11/15 2200 01/12/15 0543 01/13/15 1420 01/14/15 0154  NA 135 138 138 140  K 3.9 3.4* 3.0* 3.4*  CL 103 106 111 111  CO2 20* 22 20* 18*  GLUCOSE 181* 147* 106* 134*  BUN 18 16 9 7   CREATININE 1.01* 0.97 0.76 0.81  CALCIUM 9.6 8.9 8.8* 8.8*  MG  --   --  1.3* 1.2*    CBG:  Recent Labs Lab 01/13/15 0733 01/13/15 1149 01/13/15 1629 01/13/15 2100 01/14/15 0728  GLUCAP 112* 143* 104* 95 145*    GFR Estimated Creatinine Clearance: 53.4 mL/min (by C-G formula based on Cr of 0.81).  Coagulation profile  Recent Labs Lab 01/12/15 0620  INR 1.23    Cardiac Enzymes  Recent Labs Lab 01/12/15 0100 01/12/15 0620 01/12/15 1240  TROPONINI 0.08* 0.09* 0.06*    Invalid input(s): POCBNP No results for input(s): DDIMER in the last 72 hours. No results for input(s): HGBA1C in the last 72 hours.  Recent Labs  01/12/15 0543  CHOL 217*  HDL 20*  LDLCALC 156*  TRIG  207*  CHOLHDL 10.9    Recent Labs  01/12/15 0543  TSH 3.421   No results for input(s): VITAMINB12, FOLATE, FERRITIN, TIBC, IRON, RETICCTPCT in the last 72 hours. No results for input(s): LIPASE, AMYLASE in the last 72 hours.  Urine Studies No results for input(s): UHGB, CRYS in the last 72 hours.  Invalid input(s): UACOL, UAPR, USPG, UPH, UTP, UGL, UKET, UBIL, UNIT, UROB, ULEU, UEPI, UWBC, URBC, UBAC, CAST, UCOM, BILUA  MICROBIOLOGY: Recent Results (from the past 240 hour(s))  Urine culture     Status: None   Collection Time: 01/12/15 12:30 AM  Result Value Ref Range Status   Specimen Description URINE, CATHETERIZED  Final   Special Requests Immunocompromised  Final   Culture NO GROWTH 1 DAY  Final   Report Status 01/13/2015 FINAL  Final  Culture, blood (x 2)  Status: None (Preliminary result)   Collection Time: 01/12/15  5:43 AM  Result Value Ref Range Status   Specimen Description BLOOD LEFT ANTECUBITAL  Final   Special Requests IN PEDIATRIC BOTTLE 5CC  Final   Culture NO GROWTH 1 DAY  Final   Report Status PENDING  Incomplete  MRSA PCR Screening     Status: None   Collection Time: 01/12/15  6:10 AM  Result Value Ref Range Status   MRSA by PCR NEGATIVE NEGATIVE Final    Comment:        The GeneXpert MRSA Assay (FDA approved for NASAL specimens only), is one component of a comprehensive MRSA colonization surveillance program. It is not intended to diagnose MRSA infection nor to guide or monitor treatment for MRSA infections.   Culture, blood (x 2)     Status: None (Preliminary result)   Collection Time: 01/12/15  6:20 AM  Result Value Ref Range Status   Specimen Description BLOOD LEFT ANTECUBITAL  Final   Special Requests IN PEDIATRIC BOTTLE 3CC  Final   Culture NO GROWTH 1 DAY  Final   Report Status PENDING  Incomplete    RADIOLOGY STUDIES/RESULTS: Ct Head Wo Contrast  12/18/2014  CLINICAL DATA:  Per ems pt is from [facility].  Pt does not talk much. Denies pain. Hx of dementia. Family reports alert per norm, but more lethargic, in ED 1 week ago for UTI, discharged to [facility]. Since then pt has not really recovered. Just "more lethargic than usual". md at facility saw pt and noted "signs of parkinsons EXAM: CT HEAD WITHOUT CONTRAST TECHNIQUE: Contiguous axial images were obtained from the base of the skull through the vertex without intravenous contrast. COMPARISON:  None. FINDINGS: The ventricles are normal in configuration. There is ventricular and sulcal enlargement reflecting mild diffuse age related atrophy. Patchy white matter hypoattenuation is noted consistent with moderate to advanced chronic microvascular ischemic change. There are no parenchymal masses or mass effect. There is no evidence of a cortical infarct there are no extra-axial masses or abnormal fluid collections. There is no intracranial hemorrhage. The visualized sinuses and mastoid air cells are clear. IMPRESSION: 1. No acute finding. 2. Mild generalized atrophy. Moderate to advanced chronic microvascular ischemic change. Electronically Signed   By: Lajean Manes M.D.   On: 12/18/2014 18:13   Mr Brain Wo Contrast  01/12/2015  CLINICAL DATA:  Diaphoresis and tachycardia. History dementia, atrial fibrillation not on anticoagulant, hypertension, chronic kidney disease. EXAM: MRI HEAD WITHOUT CONTRAST TECHNIQUE: Multiplanar, multiecho pulse sequences of the brain and surrounding structures were obtained without intravenous contrast. COMPARISON:  CT head December 18, 2014 FINDINGS: 7 mm focus of reduced diffusion RIGHT posterior frontal lobe, with corresponding low ADC value. No susceptibility artifact to suggest hemorrhage. Ventricles and sulci are overall normal for patient's age. Confluent supratentorial and pontine white matter T2 hyperintensities. Tiny old bilateral inferior cerebellar infarcts. Patchy T2 hyperintense old old the infarcts in the thalamus  in addition to perivascular spaces and lacunar infarcts in basal ganglia. No midline shift, mass effect or mass lesions. No abnormal extra-axial fluid collections. No extra-axial masses though, contrast enhanced sequences would be more sensitive. Normal major intracranial vascular flow voids seen at the skull base. Dolicoectatic intracranial vessels most compatible with chronic hypertension. Status post bilateral ocular lens implants. No abnormal sellar expansion. Visualized paranasal sinuses and mastoid air cells are well-aerated. No suspicious calvarial bone marrow signal. Craniocervical junction maintained. IMPRESSION: 7 mm acute infarct  RIGHT posterior frontal lobe. Chronic changes including severe chronic small vessel ischemic disease and old bilateral basal ganglia/ thalamus lacunar infarcts, tiny inferior cerebellar lacunar infarcts. Electronically Signed   By: Elon Alas M.D.   On: 01/12/2015 02:31   Dg Chest Portable 1 View  01/11/2015  CLINICAL DATA:  Pt became cold and clammy at nursing home today, pt was in a-fib upon arrival to ER EXAM: PORTABLE CHEST 1 VIEW COMPARISON:  12/18/2014 FINDINGS: Numerous leads and wires project over the chest. Patient rotated left. Normal heart size. Atherosclerosis in the transverse aorta. Left hemidiaphragm elevation. No pleural effusion or pneumothorax. Left base scarring and volume loss. There is also patchy right base atelectasis. IMPRESSION: No acute cardiopulmonary disease. Left base scarring with volume loss. Electronically Signed   By: Abigail Miyamoto M.D.   On: 01/11/2015 21:32   Dg Chest Port 1 View  12/18/2014  CLINICAL DATA:  Mental status change. EXAM: PORTABLE CHEST 1 VIEW COMPARISON:  11/24/2014 chest radiograph FINDINGS: Stable cardiomediastinal silhouette with normal heart size and atherosclerotic thoracic aorta. No pneumothorax. No pleural effusion. No pulmonary edema. There is stable mild scarring versus atelectasis at the left costophrenic  angle. No new focal lung opacity. There is stable severe osteoarthritis in the left glenohumeral joint. IMPRESSION: Stable mild scarring versus atelectasis at the lateral left lung base. Otherwise no active disease in the chest. Electronically Signed   By: Ilona Sorrel M.D.   On: 12/18/2014 17:04    Oren Binet, MD  [facility] Pager:[phone]  If 7PM-7AM, please contact night-coverage [email or web address] Password [email or web address] 01/14/2015, 10:20 AM   LOS: 3 days

## 2015-01-14 NOTE — Care Management Important Message (Signed)
Important Message  Patient Details  Name: Emma Mcguire MRN: [id number] Date of Birth: 03-05-1928   Medicare Important Message Given:  Yes-second notification given    Nathen May 01/14/2015, 10:12 AM

## 2015-01-14 NOTE — Progress Notes (Signed)
[name] noted to be 3.0 this afternoon.  Test page sent to [facility], awaiting further orders.  Pt resting at present.  Will continue to monitor closely.

## 2015-01-15 ENCOUNTER — Other Ambulatory Visit: Payer: Medicare Other

## 2015-01-15 LAB — BASIC METABOLIC PANEL
Anion gap: 7 (ref 5–15)
BUN: 7 mg/dL (ref 6–20)
CHLORIDE: 113 mmol/L — AB (ref 101–111)
CO2: 18 mmol/L — ABNORMAL LOW (ref 22–32)
Calcium: 8.9 mg/dL (ref 8.9–10.3)
Creatinine, Ser: 0.69 mg/dL (ref 0.44–1.00)
GFR calc Af Amer: >60 mL/min (ref >60)
GFR calc non Af Amer: >60 mL/min (ref >60)
GLUCOSE: 139 mg/dL — AB (ref 65–99)
POTASSIUM: 4 mmol/L (ref 3.5–5.1)
Sodium: 138 mmol/L (ref 135–145)

## 2015-01-15 LAB — MAGNESIUM: Magnesium: 1.6 mg/dL — ABNORMAL LOW (ref 1.7–2.4)

## 2015-01-15 LAB — HEMOGLOBIN A1C
HEMOGLOBIN A1C: 6.1 % — AB (ref 4.8–5.6)
MEAN PLASMA GLUCOSE: 128 mg/dL

## 2015-01-15 LAB — GLUCOSE, CAPILLARY
GLUCOSE-CAPILLARY: 123 mg/dL — AB (ref 65–99)
GLUCOSE-CAPILLARY: 134 mg/dL — AB (ref 65–99)

## 2015-01-15 MED ORDER — METOPROLOL TARTRATE 25 MG/10 ML ORAL SUSPENSION: 50.0000 mg | Freq: Two times a day (BID) | ORAL | Status: DC

## 2015-01-15 MED ORDER — METOPROLOL TARTRATE 25 MG/10 ML ORAL SUSPENSION
75.0000 mg | Freq: Two times a day (BID) | ORAL | Status: DC
  Filled 2015-01-15: qty 30

## 2015-01-15 MED ORDER — DIVALPROEX SODIUM 125 MG PO CSDR: 250.0000 mg | DELAYED_RELEASE_CAPSULE | Freq: Two times a day (BID) | ORAL | Status: DC

## 2015-01-15 MED ORDER — METOPROLOL TARTRATE 25 MG/10 ML ORAL SUSPENSION: 75.0000 mg | Freq: Two times a day (BID) | ORAL | Status: DC

## 2015-01-15 MED ORDER — ENSURE ENLIVE PO LIQD: 237.0000 mL | Freq: Two times a day (BID) | ORAL | Status: DC

## 2015-01-15 MED ORDER — AMLODIPINE BESYLATE 5 MG PO TABS
5.0000 mg | ORAL_TABLET | Freq: Every day | ORAL | Status: DC
  Administered 2015-01-15: 5 mg via ORAL
  Filled 2015-01-15: qty 1

## 2015-01-15 MED ORDER — PANTOPRAZOLE SODIUM 40 MG PO TBEC: 40.0000 mg | DELAYED_RELEASE_TABLET | Freq: Every day | ORAL | Status: DC

## 2015-01-15 MED ORDER — INSULIN ASPART 100 UNIT/ML ~~LOC~~ SOLN: SUBCUTANEOUS | Status: DC

## 2015-01-15 MED ORDER — ASPIRIN 81 MG PO CHEW: 324.0000 mg | CHEWABLE_TABLET | Freq: Every day | ORAL | Status: AC

## 2015-01-15 MED ORDER — ATORVASTATIN CALCIUM 10 MG PO TABS: 10.0000 mg | ORAL_TABLET | Freq: Every day | ORAL | Status: DC

## 2015-01-15 NOTE — Discharge Summary (Signed)
PATIENT DETAILS Name: Emma Mcguire Age: 81 y.o. Sex: female Date of Birth: 1927/07/17 MRN: [id number]. Admitting Physician: Ivor Costa, MD [id number] [name], MD  Admit Date: 01/11/2015 Discharge date: 01/15/2015  Recommendations for Outpatient Follow-up:  1. Ensure follow up with Neurology 2. Ensure follow up with PT and SLP at SNF. 3. A1C pending-please follow   4. Suggest Palliative care evaluation at SNF 5. Optimize antihypertensive regimen 6. Repeat CBC and chemistries in 1-2 weeks 7. Follow blood cultures till final  PRIMARY DISCHARGE DIAGNOSIS:  Principal Problem:   Atrial fibrillation with RVR (HCC) Active Problems:   Elevated troponin   MDD (major depressive disorder) (HCC)   GERD (gastroesophageal reflux disease)   Psychosis   HTN (hypertension)   Alzheimer disease   Diabetes mellitus without complication (HCC)   HOCM (hypertrophic obstructive cardiomyopathy) (HCC)   SVT (supraventricular tachycardia) (HCC)   Atrial fibrillation (HCC)   Sepsis (HCC)   Ischemic stroke ([place])   Acute encephalopathy   Paroxysmal atrial fibrillation (HCC)   Schizophrenia (HCC)   Secondary parkinsonism ([place])   Depression   Anxiety state   Essential hypertension   HLD (hyperlipidemia)   Nonorganic psychosis      PAST MEDICAL HISTORY: Past Medical History  Diagnosis Date  . Hypertension   . Alzheimer disease   . Fibromyalgia   . Atrial fibrillation ([place])   . Colon polyp   . GERD (gastroesophageal reflux disease)   . Diabetes mellitus without complication ([place])   . Neuropathy ([place])   . HOCM (hypertrophic obstructive cardiomyopathy) ([place])   . Chronic kidney disease     DISCHARGE MEDICATIONS: Current Discharge Medication List    START taking these medications   Details  atorvastatin (LIPITOR) 10 MG tablet Take 1 tablet (10 mg total) by mouth daily at 6 PM.    divalproex (DEPAKOTE SPRINKLE) 125 MG capsule Take 2 capsules (250 mg total) by mouth every 12 (twelve)  hours.    feeding supplement, ENSURE ENLIVE, (ENSURE ENLIVE) LIQD Take 237 mLs by mouth 2 (two) times daily between meals.    insulin aspart (NOVOLOG) 100 UNIT/ML injection 0-9 Units, Subcutaneous, 3 times daily with meals CBG < 70: implement hypoglycemia protocol CBG 70 - 120: 0 units CBG 121 - 150: 1 unit CBG 151 - 200: 2 units CBG 201 - 250: 3 units CBG 251 - 300: 5 units CBG 301 - 350: 7 units CBG 351 - 400: 9 units CBG > 400: call MD    metoprolol tartrate (LOPRESSOR) 25 mg/10 mL SUSP Take 30 mLs (75 mg total) by mouth 2 (two) times daily.      CONTINUE these medications which have CHANGED   Details  aspirin 81 MG chewable tablet Chew 4 tablets (324 mg total) by mouth daily.    pantoprazole (PROTONIX) 40 MG tablet Take 1 tablet (40 mg total) by mouth daily.      CONTINUE these medications which have NOT CHANGED   Details  amLODipine (NORVASC) 5 MG tablet Take 5 mg by mouth daily.    docusate sodium (COLACE) 100 MG capsule Take 1 capsule (100 mg total) by mouth daily. Qty: 10 capsule, Refills: 0    escitalopram (LEXAPRO) 5 MG tablet Take 10 mg by mouth daily.     fluticasone (FLONASE) 50 MCG/ACT nasal spray Place 2 sprays into both nostrils daily.    magnesium hydroxide (MILK OF MAGNESIA) 400 MG/5ML suspension Take 30 mLs by mouth daily as needed for mild constipation.    Multiple Vitamin (  THERA) TABS Take 1 tablet by mouth daily.    vitamin B-12 (CYANOCOBALAMIN) 1000 MCG tablet Take 1,000 mcg by mouth daily.      STOP taking these medications     loxapine (LOXITANE) 10 MG capsule      metoprolol succinate (TOPROL-XL) 50 MG 24 hr tablet         ALLERGIES:   Allergies  Allergen Reactions  . Cymbalta [Duloxetine Hcl] Other (See Comments)    Listed on MAR  . Lipitor [Atorvastatin] Other (See Comments)    Listed on MAR  . Metformin And Related Other (See Comments)    Listed on MAR  . Simvastatin Other (See Comments)    Listed on MAR  . Verapamil      Sinus arrest    BRIEF HPI:  See H&P, Labs, Consult and Test reports for all details in brief, patient  is a 81 y.o. female with PMH of dementia, atrial fibrillation not on anticoagulants, hypertension, diet-controlled diabetes, GERD, HOCM, psychosis, who presented with Afib RVR and Confusion.  CONSULTATIONS:   cardiology and neurology  PERTINENT RADIOLOGIC STUDIES: Ct Head Wo Contrast  12/18/2014  CLINICAL DATA:  Per ems pt is from [facility]. Pt does not talk much. Denies pain. Hx of dementia. Family reports alert per norm, but more lethargic, in ED 1 week ago for UTI, discharged to [facility]. Since then pt has not really recovered. Just "more lethargic than usual". md at facility saw pt and noted "signs of parkinsons EXAM: CT HEAD WITHOUT CONTRAST TECHNIQUE: Contiguous axial images were obtained from the base of the skull through the vertex without intravenous contrast. COMPARISON:  None. FINDINGS: The ventricles are normal in configuration. There is ventricular and sulcal enlargement reflecting mild diffuse age related atrophy. Patchy white matter hypoattenuation is noted consistent with moderate to advanced chronic microvascular ischemic change. There are no parenchymal masses or mass effect. There is no evidence of a cortical infarct there are no extra-axial masses or abnormal fluid collections. There is no intracranial hemorrhage. The visualized sinuses and mastoid air cells are clear. IMPRESSION: 1. No acute finding. 2. Mild generalized atrophy. Moderate to advanced chronic microvascular ischemic change. Electronically Signed   By: Lajean Manes M.D.   On: 12/18/2014 18:13   Mr Brain Wo Contrast  01/12/2015  CLINICAL DATA:  Diaphoresis and tachycardia. History dementia, atrial fibrillation not on anticoagulant, hypertension, chronic kidney disease. EXAM: MRI HEAD WITHOUT CONTRAST TECHNIQUE: Multiplanar, multiecho pulse sequences of the brain and surrounding  structures were obtained without intravenous contrast. COMPARISON:  CT head December 18, 2014 FINDINGS: 7 mm focus of reduced diffusion RIGHT posterior frontal lobe, with corresponding low ADC value. No susceptibility artifact to suggest hemorrhage. Ventricles and sulci are overall normal for patient's age. Confluent supratentorial and pontine white matter T2 hyperintensities. Tiny old bilateral inferior cerebellar infarcts. Patchy T2 hyperintense old old the infarcts in the thalamus in addition to perivascular spaces and lacunar infarcts in basal ganglia. No midline shift, mass effect or mass lesions. No abnormal extra-axial fluid collections. No extra-axial masses though, contrast enhanced sequences would be more sensitive. Normal major intracranial vascular flow voids seen at the skull base. Dolicoectatic intracranial vessels most compatible with chronic hypertension. Status post bilateral ocular lens implants. No abnormal sellar expansion. Visualized paranasal sinuses and mastoid air cells are well-aerated. No suspicious calvarial bone marrow signal. Craniocervical junction maintained. IMPRESSION: 7 mm acute infarct RIGHT posterior frontal lobe. Chronic changes including severe chronic small vessel ischemic  disease and old bilateral basal ganglia/ thalamus lacunar infarcts, tiny inferior cerebellar lacunar infarcts. Electronically Signed   By: Elon Alas M.D.   On: 01/12/2015 02:31   Dg Chest Portable 1 View  01/11/2015  CLINICAL DATA:  Pt became cold and clammy at nursing home today, pt was in a-fib upon arrival to ER EXAM: PORTABLE CHEST 1 VIEW COMPARISON:  12/18/2014 FINDINGS: Numerous leads and wires project over the chest. Patient rotated left. Normal heart size. Atherosclerosis in the transverse aorta. Left hemidiaphragm elevation. No pleural effusion or pneumothorax. Left base scarring and volume loss. There is also patchy right base atelectasis. IMPRESSION: No acute cardiopulmonary disease. Left  base scarring with volume loss. Electronically Signed   By: Abigail Miyamoto M.D.   On: 01/11/2015 21:32   Dg Chest Port 1 View  12/18/2014  CLINICAL DATA:  Mental status change. EXAM: PORTABLE CHEST 1 VIEW COMPARISON:  11/24/2014 chest radiograph FINDINGS: Stable cardiomediastinal silhouette with normal heart size and atherosclerotic thoracic aorta. No pneumothorax. No pleural effusion. No pulmonary edema. There is stable mild scarring versus atelectasis at the left costophrenic angle. No new focal lung opacity. There is stable severe osteoarthritis in the left glenohumeral joint. IMPRESSION: Stable mild scarring versus atelectasis at the lateral left lung base. Otherwise no active disease in the chest. Electronically Signed   By: Ilona Sorrel M.D.   On: 12/18/2014 17:04     PERTINENT LAB RESULTS: CBC:  Recent Labs  01/13/15 1420 01/14/15 0154  WBC 10.3 9.4  HGB 11.5* 11.5*  HCT 35.4* 35.6*  PLT 236 278   CMET CMP     Component Value Date/Time   NA 138 01/15/2015 0518   K 4.0 01/15/2015 0518   CL 113* 01/15/2015 0518   CO2 18* 01/15/2015 0518   GLUCOSE 139* 01/15/2015 0518   BUN 7 01/15/2015 0518   CREATININE 0.69 01/15/2015 0518   CALCIUM 8.9 01/15/2015 0518   PROT 4.9* 01/14/2015 0154   ALBUMIN 2.0* 01/14/2015 0154   AST 25 01/14/2015 0154   ALT 31 01/14/2015 0154   ALKPHOS 93 01/14/2015 0154   BILITOT 1.1 01/14/2015 0154   GFRNONAA >60 01/15/2015 0518   GFRAA >60 01/15/2015 0518    GFR Estimated Creatinine Clearance: 54.1 mL/min (by C-G formula based on Cr of 0.69). No results for input(s): LIPASE, AMYLASE in the last 72 hours.  Recent Labs  01/12/15 1240  TROPONINI 0.06*   Invalid input(s): POCBNP No results for input(s): DDIMER in the last 72 hours. No results for input(s): HGBA1C in the last 72 hours. No results for input(s): CHOL, HDL, LDLCALC, TRIG, CHOLHDL, LDLDIRECT in the last 72 hours. No results for input(s): TSH, T4TOTAL, T3FREE, THYROIDAB in the last  72 hours.  Invalid input(s): FREET3 No results for input(s): VITAMINB12, FOLATE, FERRITIN, TIBC, IRON, RETICCTPCT in the last 72 hours. Coags: No results for input(s): INR in the last 72 hours.  Invalid input(s): PT Microbiology: Recent Results (from the past 240 hour(s))  Urine culture     Status: None   Collection Time: 01/12/15 12:30 AM  Result Value Ref Range Status   Specimen Description URINE, CATHETERIZED  Final   Special Requests Immunocompromised  Final   Culture NO GROWTH 1 DAY  Final   Report Status 01/13/2015 FINAL  Final  Culture, blood (x 2)     Status: None (Preliminary result)   Collection Time: 01/12/15  5:43 AM  Result Value Ref Range Status   Specimen Description BLOOD LEFT ANTECUBITAL  Final   Special Requests IN PEDIATRIC BOTTLE 5CC  Final   Culture NO GROWTH 2 DAYS  Final   Report Status PENDING  Incomplete  MRSA PCR Screening     Status: None   Collection Time: 01/12/15  6:10 AM  Result Value Ref Range Status   MRSA by PCR NEGATIVE NEGATIVE Final    Comment:        The GeneXpert MRSA Assay (FDA approved for NASAL specimens only), is one component of a comprehensive MRSA colonization surveillance program. It is not intended to diagnose MRSA infection nor to guide or monitor treatment for MRSA infections.   Culture, blood (x 2)     Status: None (Preliminary result)   Collection Time: 01/12/15  6:20 AM  Result Value Ref Range Status   Specimen Description BLOOD LEFT ANTECUBITAL  Final   Special Requests IN PEDIATRIC BOTTLE 3CC  Final   Culture NO GROWTH 2 DAYS  Final   Report Status PENDING  Incomplete     BRIEF HOSPITAL COURSE:  Assessment/Plan: Acute CVA: In the setting of atrial fibrillation with a CHA2DS2-VASc Score of 6, unfortunately not a candidate for anticoagulation given advanced dementia and fall risk. Patient was seen by both neurology and cardiology during her stay here. Recommendations are to continue aspirin 325 mg on discharge.  Carotid Doppler did not show any significant stenosis, echo without embolic source and preserved ejection fraction, LDL 156, A1c pending. On exam appears to have generalized weakness throughout-limited exam, but approximately 3/5 strength in the lower extremities. 4/5 in the upper extremities. Currently stable for discharge to SNF with outpatient Neuro follow up .Will need PT/SLP follow up at SNF.   Atrial fibrillation with CLE:[id number] amiodarone infusion on admission-now rate controlled with metoprolol. Telemetry-unremarkable-and also not reliable as patient has tremors and has a lot of artifacts.See above regarding anticoagulation  Essential hypertension: Blood pressure fluctuating-but still not optimally controlled-we will increase metoprolol to 75 mg twice a day,restart amlodipine. Monitor and optimize blood pressure regimen in the outpatient setting.   Dyslipidemia: LDL 156-noted goal-continue statin.  Elevated troponin: Minimally elevated troponin-likely in the setting of demand ischemia, trend is flat and not consistent with ACS. Manage medically given numerous medical comorbidities.  HOCM: Continue metoprolol  Depression/anxiety: Continue Lexapro  GERD: PPI  ? History of diabetes: CBGs stable with SSI-await A1c. Does not appear to be on any medications for diabetes prior to this admission.  Hypokalemia:  Repleted, recheck electrolytes in 1 week.   Dysphagia: Suspect multifactorial from dementia/generalized weakness and acute CVA. Continue with dysphagia 1 diet- on discharge (note was already on dysphagia 1 diet prior to this admission). Discussed aspiration risk with patient's daughter-in-law over the phone on 11/7-family aware and willing to accept risks and continue with dysphagia 1 diet-ensure SLP follow-up at SNF.  Dementia: Stable-pleasantly confused today. Apparently was on loxapine-which has now been discontinued because of rigidity. Starte Depakote-we will follow closely and  optimizing the outpatient setting. Suggest periodic monitoring of Depakote levels and LFTs.   Obesity  TODAY-DAY OF DISCHARGE:  Subjective:   Emma Mcguire today has No major complaints-she remains pleasantly confused   Objective:   Blood pressure 186/90, pulse 100, temperature 98.7 F (37.1 C), temperature source Oral, resp. rate 22, height 5\' 5"  (1.651 m), weight 81.149 kg (178 lb 14.4 oz), SpO2 98 %.  Intake/Output Summary (Last 24 hours) at 01/15/15 0925 Last data filed at 01/15/15 0500  Gross per 24 hour  Intake    540 ml  Output      4 ml  Net    536 ml   Filed Weights   01/12/15 0620 01/14/15 0400 01/15/15 0500  Weight: 82.4 kg (181 lb 10.5 oz) 81.012 kg (178 lb 9.6 oz) 81.149 kg (178 lb 14.4 oz)    Exam Awake Alert, Oriented *3, No new F.N deficits, Normal affect [place].AT,PERRAL Supple Neck,No JVD, No cervical lymphadenopathy appriciated.  Symmetrical Chest wall movement, Good air movement bilaterally, CTAB RRR,No Gallops,Rubs or new Murmurs, No Parasternal Heave +ve B.Sounds, Abd Soft, Non tender, No organomegaly appriciated, No rebound -guarding or rigidity. No Cyanosis, Clubbing or edema, No new Rash or bruise  DISCHARGE CONDITION: Stable  DISPOSITION: SNF  CODE STATUS: DNR  DISCHARGE INSTRUCTIONS:    Activity:  As tolerated with Full fall precautions use walker/cane & assistance as needed  Get Medicines reviewed and adjusted: Please take all your medications with you for your next visit with your Primary MD  Please request your Primary MD to go over all hospital tests and procedure/radiological results at the follow up, please ask your Primary MD to get all Hospital records sent to his/her office.  If you experience worsening of your admission symptoms, develop shortness of breath, life threatening emergency, suicidal or homicidal thoughts you must seek medical attention immediately by calling 911 or calling your MD immediately  if symptoms less  severe.  You must read complete instructions/literature along with all the possible adverse reactions/side effects for all the Medicines you take and that have been prescribed to you. Take any new Medicines after you have completely understood and accpet all the possible adverse reactions/side effects.   Do not drive when taking Pain medications.   Do not take more than prescribed Pain, Sleep and Anxiety Medications  Special Instructions: If you have smoked or chewed Tobacco  in the last 2 yrs please stop smoking, stop any regular Alcohol  and or any Recreational drug use.  Wear Seat belts while driving.  Please note  You were cared for by a hospitalist during your hospital stay. Once you are discharged, your primary care physician will handle any further medical issues. Please note that NO REFILLS for any discharge medications will be authorized once you are discharged, as it is imperative that you return to your primary care physician (or establish a relationship with a primary care physician if you do not have one) for your aftercare needs so that they can reassess your need for medications and monitor your lab values.   Diet recommendation: Diabetic Diet/Heart Healthy diet-but see below Dysphagia 1 (Puree);Thin  Medication Administration: Whole meds with puree Compensations: Check for pocketing  Aspiration precautions:yes  Discharge Instructions    Diet - low sodium heart healthy    Complete by:  As directed   Dysphagia 1 (Puree);Thin  Medication Administration: Whole meds with puree Compensations: Check for pocketing      Diet Carb Modified    Complete by:  As directed      Increase activity slowly    Complete by:  As directed            Follow-up Information    Follow up with Tawanna Solo, MD. Schedule an appointment as soon as possible for a visit in 2 weeks.   Specialty:  Family Medicine   Contact information:   [place]  [address] [phone]       Follow up with Andrey Spearman, MD On 02/06/2015.   Specialties:  Neurology, Radiology  Why:  appt at 11:30 am   Contact information:   [address] [place] [address] [phone]       Total Time spent on discharge equals  45 minutes.  SignedOren Binet 01/15/2015 9:25 AM

## 2015-01-15 NOTE — NC FL2 (Deleted)
[place] LEVEL OF CARE SCREENING TOOL     IDENTIFICATION  Patient Name: Emma Mcguire Birthdate: 29-Mar-1927 Sex: female Admission Date (Current Location): 01/11/2015  [place] and [place] Number:  [facility] )   Facility and Address:  The [place], [place] [address], [place], [place] [address]      Provider Number: [id number]  Attending Physician Name and Address:  Jonetta Osgood, MD  Relative Name and Phone Number:   Ikea Demicco [phone])    Current Level of Care: Hospital Recommended Level of Care: ALF Prior Approval Number:    Date Approved/Denied:   PASRR Number: [id number]  Discharge Plan:  (ALF)    Current Diagnoses: Patient Active Problem List   Diagnosis Date Noted  . Secondary parkinsonism ([place])   . Depression   . Anxiety state   . Essential hypertension   . HLD (hyperlipidemia)   . Nonorganic psychosis   . Atrial fibrillation ([place]) 01/12/2015  . Sepsis ([place]) 01/12/2015  . Ischemic stroke ([place]) 01/12/2015  . Acute encephalopathy   . Paroxysmal atrial fibrillation (HCC)   . Schizophrenia ([place])   . Atrial fibrillation with RVR ([place]) 01/11/2015  . SVT (supraventricular tachycardia) ([place]) 01/11/2015  . Alzheimer disease   . Diabetes mellitus without complication ([place])   . HOCM (hypertrophic obstructive cardiomyopathy) ([place])   . HTN (hypertension) 01/07/2015  . Edema 01/06/2015  . Blister of skin without infection 01/05/2015  . Parkinsonian features 12/25/2014  . Weakness 12/18/2014  . Altered mental status 12/11/2014  . UTI (urinary tract infection) 12/02/2014  . GERD (gastroesophageal reflux disease) 12/02/2014  . Psychosis 12/02/2014  . Renal failure (ARF), acute on chronic (HCC) 11/24/2014  . MDD (major depressive disorder) ([place]) 11/24/2014  . Pressure ulcer 11/24/2014  . Elevated troponin 11/23/2014    Orientation ACTIVITIES/SOCIAL BLADDER RESPIRATION    Place, Self  Family supportive  Incontinent Normal  BEHAVIORAL SYMPTOMS/MOOD NEUROLOGICAL BOWEL NUTRITION STATUS   (None )  (None) Incontinent PUREED  PHYSICIAN VISITS COMMUNICATION OF NEEDS Height & Weight Skin    Verbally 5\' 5"  (165.1 cm) 181 lbs. PU Stage and Appropriate Care PU Stage 1 Dressing: No Dressing        AMBULATORY STATUS RESPIRATION    Assist extensive Normal      Personal Care Assistance Level of Assistance  Total care       Total Care Assistance: Maximum assistance    Functional Limitations Info   (None )             SPECIAL CARE FACTORS FREQUENCY  PT (By licensed PT), OT (By licensed OT)     PT Frequency: 2 OT Frequency: 2           Additional Factors Info  Code Status, Allergies Code Status Info: DNR Allergies Info: Cymbalta, Lipitor, Metformin And Related, Simvastatin, Verapamil           Current Medications (01/15/2015): Current Facility-Administered Medications  Medication Dose Route Frequency Provider Last Rate Last Dose  . acetaminophen (TYLENOL) tablet 650 mg  650 mg Oral Q6H PRN Ivor Costa, MD       Or  . acetaminophen (TYLENOL) suppository 650 mg  650 mg Rectal Q6H PRN Ivor Costa, MD      . amLODipine (NORVASC) tablet 5 mg  5 mg Oral Daily Jonetta Osgood, MD   5 mg at 01/15/15 0957  . aspirin chewable tablet 324 mg  324 mg Oral Daily Ivor Costa, MD   [id number]  mg at 01/15/15 0820  . atorvastatin (LIPITOR) tablet 10 mg  10 mg Oral q1800 Allie Bossier, MD   10 mg at 01/14/15 1716  . divalproex (DEPAKOTE SPRINKLE) capsule 250 mg  250 mg Oral Q12H Jonetta Osgood, MD   250 mg at 01/15/15 0827  . docusate sodium (COLACE) capsule 100 mg  100 mg Oral Daily Ivor Costa, MD   100 mg at 01/15/15 0820  . enoxaparin (LOVENOX) injection 40 mg  40 mg Subcutaneous Q24H Donzetta Starch, NP   40 mg at 01/15/15 0820  . escitalopram (LEXAPRO) tablet 10 mg  10 mg Oral Daily Ivor Costa, MD   10 mg at 01/15/15 0820  . feeding supplement (ENSURE ENLIVE) (ENSURE ENLIVE) liquid 237 mL  237 mL  Oral BID BM Rogue Bussing, RD   237 mL at 01/15/15 1235  . fluticasone (FLONASE) 50 MCG/ACT nasal spray 2 spray  2 spray Each Nare Daily Ivor Costa, MD   2 spray at 01/13/15 1000  . hydrALAZINE (APRESOLINE) injection 5 mg  5 mg Intravenous Q2H PRN Ivor Costa, MD   5 mg at 01/15/15 0837  . insulin aspart (novoLOG) injection 0-9 Units  0-9 Units Subcutaneous TID WC Ivor Costa, MD   1 Units at 01/15/15 1234  . magnesium hydroxide (MILK OF MAGNESIA) suspension 30 mL  30 mL Oral Daily PRN Ivor Costa, MD      . metoprolol tartrate (LOPRESSOR) 25 mg/10 mL oral suspension 75 mg  75 mg Oral BID Jonetta Osgood, MD      . multivitamin with minerals tablet 1 tablet  1 tablet Oral Daily Ivor Costa, MD   1 tablet at 01/15/15 [id number]  . ondansetron (ZOFRAN) tablet 4 mg  4 mg Oral Q6H PRN Ivor Costa, MD       Or  . ondansetron [facility]) injection 4 mg  4 mg Intravenous Q6H PRN Ivor Costa, MD      . pantoprazole (PROTONIX) EC tablet 40 mg  40 mg Oral Daily Jonetta Osgood, MD   40 mg at 01/15/15 1829  . sodium chloride 0.9 % injection 3 mL  3 mL Intravenous Q12H Ivor Costa, MD   3 mL at 01/14/15 2211  . vitamin B-12 (CYANOCOBALAMIN) tablet 1,000 mcg  1,000 mcg Oral Daily Ivor Costa, MD   1,000 mcg at 01/15/15 9371   Do not use this list as official medication orders. Please verify with discharge summary.  Discharge Medications:   Medication List    STOP taking these medications        loxapine 10 MG capsule  Commonly known as:  LOXITANE     metoprolol succinate 50 MG 24 hr tablet  Commonly known as:  TOPROL-XL      TAKE these medications        amLODipine 5 MG tablet  Commonly known as:  NORVASC  Take 5 mg by mouth daily.     aspirin 81 MG chewable tablet  Chew 4 tablets (324 mg total) by mouth daily.     atorvastatin 10 MG tablet  Commonly known as:  LIPITOR  Take 1 tablet (10 mg total) by mouth daily at 6 PM.     divalproex 125 MG capsule  Commonly known as:  DEPAKOTE SPRINKLE  Take 2  capsules (250 mg total) by mouth every 12 (twelve) hours.     docusate sodium 100 MG capsule  Commonly known as:  COLACE  Take 1 capsule (100 mg total) by  mouth daily.     escitalopram 5 MG tablet  Commonly known as:  LEXAPRO  Take 10 mg by mouth daily.     feeding supplement (ENSURE ENLIVE) Liqd  Take 237 mLs by mouth 2 (two) times daily between meals.     fluticasone 50 MCG/ACT nasal spray  Commonly known as:  FLONASE  Place 2 sprays into both nostrils daily.     insulin aspart 100 UNIT/ML injection  Commonly known as:  novoLOG  0-9 Units, Subcutaneous, 3 times daily with meals CBG < 70: implement hypoglycemia protocol CBG 70 - 120: 0 units CBG 121 - 150: 1 unit CBG 151 - 200: 2 units CBG 201 - 250: 3 units CBG 251 - 300: 5 units CBG 301 - 350: 7 units CBG 351 - 400: 9 units CBG > 400: call MD     magnesium hydroxide 400 MG/5ML suspension  Commonly known as:  MILK OF MAGNESIA  Take 30 mLs by mouth daily as needed for mild constipation.     metoprolol tartrate 25 mg/10 mL Susp  Commonly known as:  LOPRESSOR  Take 30 mLs (75 mg total) by mouth 2 (two) times daily.     pantoprazole 40 MG tablet  Commonly known as:  PROTONIX  Take 1 tablet (40 mg total) by mouth daily.     THERA Tabs  Take 1 tablet by mouth daily.     vitamin B-12 1000 MCG tablet  Commonly known as:  CYANOCOBALAMIN  Take 1,000 mcg by mouth daily.        Relevant Imaging Results:  Relevant Lab Results:  Recent Labs    Additional Information SS# [id number]  Williemae Area, [place]

## 2015-01-15 NOTE — Progress Notes (Signed)
Ok per MD for d/c today back to [facility] via EMS per request of patient's son Jeneen Rinks who does not feel he can manage patient in a care.  CSW spoke multiple times with Karena Addison- staff at [facility].  Fl2 updated and sent to [facility] for review along with DC summary.  Patient approved for d/c back to the facility.  Patient noted to be alert to person only  Nursing notified re: d/c; DC packet prepared along with EMS transport forms.  Son stated he was pleased with return to facility;  PT recommended SNF placement- however son indicated that patient did not participate in therapy when she was at [facility] and he does not feel that this would be a good plan for patient. No further CSW needs identified. CSW signing off.  Lorie Phenix. Pauline Good, [place]

## 2015-01-15 NOTE — NC FL2 (Deleted)
[place] LEVEL OF CARE SCREENING TOOL     IDENTIFICATION  Patient Name: Emma Mcguire Birthdate: Jul 24, 1927 Sex: female Admission Date (Current Location): 01/11/2015  [place] and [place] Number:  [facility] )   Facility and Address:  The [place]. [facility], [place] [address] [place], [place] [address]      Provider Number: [id number]  Attending Physician Name and Address:  Jonetta Osgood, MD  Relative Name and Phone Number:   Marieelena Bartko [phone])    Current Level of Care: Hospital Recommended Level of Care: ALF Prior Approval Number:    Date Approved/Denied:   PASRR Number: [id number]  Discharge Plan:  (ALF)    Current Diagnoses: Patient Active Problem List   Diagnosis Date Noted  . Secondary parkinsonism ([place])   . Depression   . Anxiety state   . Essential hypertension   . HLD (hyperlipidemia)   . Nonorganic psychosis   . Atrial fibrillation ([place]) 01/12/2015  . Sepsis ([place]) 01/12/2015  . Ischemic stroke ([place]) 01/12/2015  . Acute encephalopathy   . Paroxysmal atrial fibrillation (HCC)   . Schizophrenia ([place])   . Atrial fibrillation with RVR ([place]) 01/11/2015  . SVT (supraventricular tachycardia) ([place]) 01/11/2015  . Alzheimer disease   . Diabetes mellitus without complication ([place])   . HOCM (hypertrophic obstructive cardiomyopathy) ([place])   . HTN (hypertension) 01/07/2015  . Edema 01/06/2015  . Blister of skin without infection 01/05/2015  . Parkinsonian features 12/25/2014  . Weakness 12/18/2014  . Altered mental status 12/11/2014  . UTI (urinary tract infection) 12/02/2014  . GERD (gastroesophageal reflux disease) 12/02/2014  . Psychosis 12/02/2014  . Renal failure (ARF), acute on chronic (HCC) 11/24/2014  . MDD (major depressive disorder) ([place]) 11/24/2014  . Pressure ulcer 11/24/2014  . Elevated troponin 11/23/2014    Orientation ACTIVITIES/SOCIAL BLADDER RESPIRATION    Place, Self  Family supportive  Incontinent Normal  BEHAVIORAL SYMPTOMS/MOOD NEUROLOGICAL BOWEL NUTRITION STATUS   (None )  (None) Incontinent Diet:  Pureed  PHYSICIAN VISITS COMMUNICATION OF NEEDS Height & Weight Skin    Verbally 5\' 5"  (165.1 cm) 181 lbs. PU Stage and Appropriate Care PU Stage 1 Dressing: No Dressing        AMBULATORY STATUS RESPIRATION    Assist extensive Normal      Personal Care Assistance Level of Assistance  Total care       Total Care Assistance: Maximum assistance    Functional Limitations Info   (None )             SPECIAL CARE FACTORS FREQUENCY  PT (By licensed PT), OT (By licensed OT)     PT Frequency: 2 OT Frequency: 2           Additional Factors Info  Code Status, Allergies Code Status Info: DNR Allergies Info: Cymbalta, Lipitor, Metformin And Related, Simvastatin, Verapamil           Current Medications (01/15/2015): Current Facility-Administered Medications  Medication Dose Route Frequency Provider Last Rate Last Dose  . acetaminophen (TYLENOL) tablet 650 mg  650 mg Oral Q6H PRN Ivor Costa, MD       Or  . acetaminophen (TYLENOL) suppository 650 mg  650 mg Rectal Q6H PRN Ivor Costa, MD      . amLODipine (NORVASC) tablet 5 mg  5 mg Oral Daily Jonetta Osgood, MD   5 mg at 01/15/15 0957  . aspirin chewable tablet 324 mg  324 mg Oral Daily Ivor Costa, MD  324 mg at 01/15/15 0820  . atorvastatin (LIPITOR) tablet 10 mg  10 mg Oral q1800 Allie Bossier, MD   10 mg at 01/14/15 1716  . divalproex (DEPAKOTE SPRINKLE) capsule 250 mg  250 mg Oral Q12H Jonetta Osgood, MD   250 mg at 01/15/15 0827  . docusate sodium (COLACE) capsule 100 mg  100 mg Oral Daily Ivor Costa, MD   100 mg at 01/15/15 0820  . enoxaparin (LOVENOX) injection 40 mg  40 mg Subcutaneous Q24H Donzetta Starch, NP   40 mg at 01/15/15 0820  . escitalopram (LEXAPRO) tablet 10 mg  10 mg Oral Daily Ivor Costa, MD   10 mg at 01/15/15 0820  . feeding supplement (ENSURE ENLIVE) (ENSURE ENLIVE) liquid 237 mL   237 mL Oral BID BM Rogue Bussing, RD   237 mL at 01/15/15 1235  . fluticasone (FLONASE) 50 MCG/ACT nasal spray 2 spray  2 spray Each Nare Daily Ivor Costa, MD   2 spray at 01/13/15 1000  . hydrALAZINE (APRESOLINE) injection 5 mg  5 mg Intravenous Q2H PRN Ivor Costa, MD   5 mg at 01/15/15 0837  . insulin aspart (novoLOG) injection 0-9 Units  0-9 Units Subcutaneous TID WC Ivor Costa, MD   1 Units at 01/15/15 1234  . magnesium hydroxide (MILK OF MAGNESIA) suspension 30 mL  30 mL Oral Daily PRN Ivor Costa, MD      . metoprolol tartrate (LOPRESSOR) 25 mg/10 mL oral suspension 75 mg  75 mg Oral BID Jonetta Osgood, MD      . multivitamin with minerals tablet 1 tablet  1 tablet Oral Daily Ivor Costa, MD   1 tablet at 01/15/15 [phone]  . ondansetron (ZOFRAN) tablet 4 mg  4 mg Oral Q6H PRN Ivor Costa, MD       Or  . ondansetron [facility]) injection 4 mg  4 mg Intravenous Q6H PRN Ivor Costa, MD      . pantoprazole (PROTONIX) EC tablet 40 mg  40 mg Oral Daily Jonetta Osgood, MD   40 mg at 01/15/15 2836  . sodium chloride 0.9 % injection 3 mL  3 mL Intravenous Q12H Ivor Costa, MD   3 mL at 01/14/15 2211  . vitamin B-12 (CYANOCOBALAMIN) tablet 1,000 mcg  1,000 mcg Oral Daily Ivor Costa, MD   1,000 mcg at 01/15/15 6294   Do not use this list as official medication orders. Please verify with discharge summary.  Discharge Medications:   Medication List    STOP taking these medications        loxapine 10 MG capsule  Commonly known as:  LOXITANE     metoprolol succinate 50 MG 24 hr tablet  Commonly known as:  TOPROL-XL      TAKE these medications        amLODipine 5 MG tablet  Commonly known as:  NORVASC  Take 5 mg by mouth daily.     aspirin 81 MG chewable tablet  Chew 4 tablets (324 mg total) by mouth daily.     atorvastatin 10 MG tablet  Commonly known as:  LIPITOR  Take 1 tablet (10 mg total) by mouth daily at 6 PM.     divalproex 125 MG capsule  Commonly known as:  DEPAKOTE SPRINKLE   Take 2 capsules (250 mg total) by mouth every 12 (twelve) hours.     docusate sodium 100 MG capsule  Commonly known as:  COLACE  Take 1 capsule (100 mg total)  by mouth daily.     escitalopram 5 MG tablet  Commonly known as:  LEXAPRO  Take 10 mg by mouth daily.     feeding supplement (ENSURE ENLIVE) Liqd  Take 237 mLs by mouth 2 (two) times daily between meals.     fluticasone 50 MCG/ACT nasal spray  Commonly known as:  FLONASE  Place 2 sprays into both nostrils daily.     insulin aspart 100 UNIT/ML injection  Commonly known as:  novoLOG  0-9 Units, Subcutaneous, 3 times daily with meals CBG < 70: implement hypoglycemia protocol CBG 70 - 120: 0 units CBG 121 - 150: 1 unit CBG 151 - 200: 2 units CBG 201 - 250: 3 units CBG 251 - 300: 5 units CBG 301 - 350: 7 units CBG 351 - 400: 9 units CBG > 400: call MD     magnesium hydroxide 400 MG/5ML suspension  Commonly known as:  MILK OF MAGNESIA  Take 30 mLs by mouth daily as needed for mild constipation.     metoprolol tartrate 25 mg/10 mL Susp  Commonly known as:  LOPRESSOR  Take 30 mLs (75 mg total) by mouth 2 (two) times daily.     pantoprazole 40 MG tablet  Commonly known as:  PROTONIX  Take 1 tablet (40 mg total) by mouth daily.     THERA Tabs  Take 1 tablet by mouth daily.     vitamin B-12 1000 MCG tablet  Commonly known as:  CYANOCOBALAMIN  Take 1,000 mcg by mouth daily.        Relevant Imaging Results:  Relevant Lab Results:  Recent Labs    Additional Information SS# [id number]  Williemae Area, [place]

## 2015-01-15 NOTE — NC FL2 (Addendum)
[place] LEVEL OF CARE SCREENING TOOL     IDENTIFICATION  Patient Name: Emma Mcguire Birthdate: 1927/08/19 Sex: female Admission Date (Current Location): 01/11/2015  [place] and [place] Number:  [facility] )   Facility and Address:  The [place]. [facility], [place] [address] [place], [place] [address]      Provider Number: [id number]  Attending Physician Name and Address:  Jonetta Osgood, MD  Relative Name and Phone Number:   Hildur Bayer [phone])    Current Level of Care: Hospital Recommended Level of Care: ALF Prior Approval Number:    Date Approved/Denied:   PASRR Number: [id number]  Discharge Plan:  (ALF)    Current Diagnoses: Patient Active Problem List   Diagnosis Date Noted  . Secondary parkinsonism ([place])   . Depression   . Anxiety state   . Essential hypertension   . HLD (hyperlipidemia)   . Nonorganic psychosis   . Atrial fibrillation ([place]) 01/12/2015  . Sepsis ([place]) 01/12/2015  . Ischemic stroke ([place]) 01/12/2015  . Acute encephalopathy   . Paroxysmal atrial fibrillation (HCC)   . Schizophrenia ([place])   . Atrial fibrillation with RVR ([place]) 01/11/2015  . SVT (supraventricular tachycardia) ([place]) 01/11/2015  . Alzheimer disease   . Diabetes mellitus without complication ([place])   . HOCM (hypertrophic obstructive cardiomyopathy) ([place])   . HTN (hypertension) 01/07/2015  . Edema 01/06/2015  . Blister of skin without infection 01/05/2015  . Parkinsonian features 12/25/2014  . Weakness 12/18/2014  . Altered mental status 12/11/2014  . UTI (urinary tract infection) 12/02/2014  . GERD (gastroesophageal reflux disease) 12/02/2014  . Psychosis 12/02/2014  . Renal failure (ARF), acute on chronic (HCC) 11/24/2014  . MDD (major depressive disorder) ([place]) 11/24/2014  . Pressure ulcer 11/24/2014  . Elevated troponin 11/23/2014    Orientation ACTIVITIES/SOCIAL BLADDER RESPIRATION    Place, Self  Family supportive  Incontinent Normal  BEHAVIORAL SYMPTOMS/MOOD NEUROLOGICAL BOWEL NUTRITION STATUS   (None )  (None) Incontinent Diet (DYS 1)  PHYSICIAN VISITS COMMUNICATION OF NEEDS Height & Weight Skin    Verbally 5\' 5"  (165.1 cm) 181 lbs. PU Stage and Appropriate Care PU Stage 1 Dressing: No Dressing        AMBULATORY STATUS RESPIRATION    Assist extensive Normal      Personal Care Assistance Level of Assistance  Total care       Total Care Assistance: Maximum assistance    Functional Limitations Info   (None )             SPECIAL CARE FACTORS FREQUENCY  PT (By licensed PT), OT (By licensed OT)     PT Frequency: 2 OT Frequency: 2           Additional Factors Info  Code Status, Allergies Code Status Info: DNR Allergies Info: Cymbalta, Lipitor, Metformin And Related, Simvastatin, Verapamil           Current Medications (01/15/2015): Current Facility-Administered Medications  Medication Dose Route Frequency Provider Last Rate Last Dose  . acetaminophen (TYLENOL) tablet 650 mg  650 mg Oral Q6H PRN Ivor Costa, MD       Or  . acetaminophen (TYLENOL) suppository 650 mg  650 mg Rectal Q6H PRN Ivor Costa, MD      . amLODipine (NORVASC) tablet 5 mg  5 mg Oral Daily Jonetta Osgood, MD   5 mg at 01/15/15 0957  . aspirin chewable tablet 324 mg  324 mg Oral Daily Ivor Costa, MD  324 mg at 01/15/15 0820  . atorvastatin (LIPITOR) tablet 10 mg  10 mg Oral q1800 Allie Bossier, MD   10 mg at 01/14/15 1716  . divalproex (DEPAKOTE SPRINKLE) capsule 250 mg  250 mg Oral Q12H Jonetta Osgood, MD   250 mg at 01/15/15 0827  . docusate sodium (COLACE) capsule 100 mg  100 mg Oral Daily Ivor Costa, MD   100 mg at 01/15/15 0820  . enoxaparin (LOVENOX) injection 40 mg  40 mg Subcutaneous Q24H Donzetta Starch, NP   40 mg at 01/15/15 0820  . escitalopram (LEXAPRO) tablet 10 mg  10 mg Oral Daily Ivor Costa, MD   10 mg at 01/15/15 0820  . feeding supplement (ENSURE ENLIVE) (ENSURE ENLIVE) liquid 237 mL  237  mL Oral BID BM Rogue Bussing, RD   237 mL at 01/15/15 0821  . fluticasone (FLONASE) 50 MCG/ACT nasal spray 2 spray  2 spray Each Nare Daily Ivor Costa, MD   2 spray at 01/13/15 1000  . hydrALAZINE (APRESOLINE) injection 5 mg  5 mg Intravenous Q2H PRN Ivor Costa, MD   5 mg at 01/15/15 0837  . insulin aspart (novoLOG) injection 0-9 Units  0-9 Units Subcutaneous TID WC Ivor Costa, MD   1 Units at 01/15/15 0820  . magnesium hydroxide (MILK OF MAGNESIA) suspension 30 mL  30 mL Oral Daily PRN Ivor Costa, MD      . metoprolol tartrate (LOPRESSOR) 25 mg/10 mL oral suspension 75 mg  75 mg Oral BID Jonetta Osgood, MD      . multivitamin with minerals tablet 1 tablet  1 tablet Oral Daily Ivor Costa, MD   1 tablet at 01/15/15 [phone]  . ondansetron (ZOFRAN) tablet 4 mg  4 mg Oral Q6H PRN Ivor Costa, MD       Or  . ondansetron [facility]) injection 4 mg  4 mg Intravenous Q6H PRN Ivor Costa, MD      . pantoprazole (PROTONIX) EC tablet 40 mg  40 mg Oral Daily Jonetta Osgood, MD   40 mg at 01/15/15 1021  . sodium chloride 0.9 % injection 3 mL  3 mL Intravenous Q12H Ivor Costa, MD   3 mL at 01/14/15 2211  . vitamin B-12 (CYANOCOBALAMIN) tablet 1,000 mcg  1,000 mcg Oral Daily Ivor Costa, MD   1,000 mcg at 01/15/15 1173   Do not use this list as official medication orders. Please verify with discharge summary.  Discharge Medications:   Medication List    STOP taking these medications        loxapine 10 MG capsule  Commonly known as:  LOXITANE     metoprolol succinate 50 MG 24 hr tablet  Commonly known as:  TOPROL-XL      TAKE these medications        amLODipine 5 MG tablet  Commonly known as:  NORVASC  Take 5 mg by mouth daily.     aspirin 81 MG chewable tablet  Chew 4 tablets (324 mg total) by mouth daily.     atorvastatin 10 MG tablet  Commonly known as:  LIPITOR  Take 1 tablet (10 mg total) by mouth daily at 6 PM.     divalproex 125 MG capsule  Commonly known as:  DEPAKOTE SPRINKLE  Take 2  capsules (250 mg total) by mouth every 12 (twelve) hours.     docusate sodium 100 MG capsule  Commonly known as:  COLACE  Take 1 capsule (100 mg total)  by mouth daily.     escitalopram 5 MG tablet  Commonly known as:  LEXAPRO  Take 10 mg by mouth daily.     feeding supplement (ENSURE ENLIVE) Liqd  Take 237 mLs by mouth 2 (two) times daily between meals.     fluticasone 50 MCG/ACT nasal spray  Commonly known as:  FLONASE  Place 2 sprays into both nostrils daily.     insulin aspart 100 UNIT/ML injection  Commonly known as:  novoLOG  0-9 Units, Subcutaneous, 3 times daily with meals CBG < 70: implement hypoglycemia protocol CBG 70 - 120: 0 units CBG 121 - 150: 1 unit CBG 151 - 200: 2 units CBG 201 - 250: 3 units CBG 251 - 300: 5 units CBG 301 - 350: 7 units CBG 351 - 400: 9 units CBG > 400: call MD     magnesium hydroxide 400 MG/5ML suspension  Commonly known as:  MILK OF MAGNESIA  Take 30 mLs by mouth daily as needed for mild constipation.     metoprolol tartrate 25 mg/10 mL Susp  Commonly known as:  LOPRESSOR  Take 30 mLs (75 mg total) by mouth 2 (two) times daily.     pantoprazole 40 MG tablet  Commonly known as:  PROTONIX  Take 1 tablet (40 mg total) by mouth daily.     THERA Tabs  Take 1 tablet by mouth daily.     vitamin B-12 1000 MCG tablet  Commonly known as:  CYANOCOBALAMIN  Take 1,000 mcg by mouth daily.        Relevant Imaging Results:  Relevant Lab Results:  Recent Labs    Additional Information SS# [id number]  Williemae Area, [place]

## 2015-01-15 NOTE — Progress Notes (Signed)
Attempted report to [facility] senior living.

## 2015-01-15 NOTE — Progress Notes (Signed)
Speech Language Pathology Treatment: Dysphagia  Patient Details Name: Emma Mcguire MRN: [id number] DOB: 1927-12-20 Today's Date: 01/15/2015 Time: 5056-9794 SLP Time Calculation (min) (ACUTE ONLY): 15 min  Assessment / Plan / Recommendation Clinical Impression  During dysphagia treatment, SLP provided total assist for feeding thin liquids and puree. Pt coughed (x1) after first sip of thin liquid, with no other overt s/s of aspiration throughout the session. SLP educated pt and nursing on importance of sitting upright and being alert when feeding. Per pt's daughter-in-law, pt fatigues as the day progresses. SLP recommends pt continue with current diet, with check for pocketing when pt is alert. Will sign off, no f/u needed as this is pt's baseline.   HPI Other Pertinent Information: Emma Mcguire is an 81 y.o. female hx of A fib (not on anticoagulation), HTN, DM, dementia initially presenting to ED with rapid heart rate. In ED found to be in A fib with RVR, had an elevated troponin and concern of sepsis vs SIRS with positive UA. Had MRI brain completed, imaging reviewed, shows a 21mm acute infarct in the right posterior frontal lobe. Patient recently evaluated by Dr Leta Baptist at [facility] for progressive decline and possible parkinsonism. 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    Pertinent Vitals Pain Assessment: Faces Faces Pain Scale: No hurt  SLP Plan  Discharge SLP treatment due to (comment)    Recommendations Diet recommendations: Dysphagia 1 (puree);Thin liquid Liquids provided via: Cup Medication Administration: Whole meds with puree Supervision: Full supervision/cueing for compensatory strategies Compensations: Check for pocketing Postural Changes and/or Swallow Maneuvers: Seated upright 90 degrees              Oral Care Recommendations: Oral care BID Follow up Recommendations: Skilled Nursing facility Plan: Discharge SLP treatment due to (comment)    [place], [place] 01/15/2015, 10:20 AM

## 2015-01-15 NOTE — NC FL2 (Signed)
[place] LEVEL OF CARE SCREENING TOOL     IDENTIFICATION  Patient Name: Emma Mcguire Birthdate: [birth year] Sex: female Admission Date (Current Location): 01/11/2015  [place] and [place] Number:  [facility] )   Facility and Address:  [facility] [place] [address] [place], [place] [address]      Provider Number: [id number]  Attending Physician Name and Address:  Jonetta Osgood, MD  Relative Name and Phone Number:   Miana Politte [phone])    Current Level of Care: Hospital Recommended Level of Care: [place] Prior Approval Number:    Date Approved/Denied:   PASRR Number: [id number]  Discharge Plan:  (ALF)    Current Diagnoses: Patient Active Problem List   Diagnosis Date Noted  . Secondary parkinsonism ([place])   . Depression   . Anxiety state   . Essential hypertension   . HLD (hyperlipidemia)   . Nonorganic psychosis   . Atrial fibrillation ([place]) 01/12/2015  . Sepsis ([place]) 01/12/2015  . Ischemic stroke ([place]) 01/12/2015  . Acute encephalopathy   . Paroxysmal atrial fibrillation (HCC)   . Schizophrenia ([place])   . Atrial fibrillation with RVR ([place]) 01/11/2015  . SVT (supraventricular tachycardia) ([place]) 01/11/2015  . Alzheimer disease   . Diabetes mellitus without complication ([place])   . HOCM (hypertrophic obstructive cardiomyopathy) ([place])   . HTN (hypertension) 01/07/2015  . Edema 01/06/2015  . Blister of skin without infection 01/05/2015  . Parkinsonian features 12/25/2014  . Weakness 12/18/2014  . Altered mental status 12/11/2014  . UTI (urinary tract infection) 12/02/2014  . GERD (gastroesophageal reflux disease) 12/02/2014  . Psychosis 12/02/2014  . Renal failure (ARF), acute on chronic (HCC) 11/24/2014  . MDD (major depressive disorder) ([place]) 11/24/2014  . Pressure ulcer 11/24/2014  . Elevated troponin 11/23/2014    Orientation ACTIVITIES/SOCIAL BLADDER RESPIRATION    Place, Self  Family supportive Incontinent Normal  BEHAVIORAL SYMPTOMS/MOOD NEUROLOGICAL BOWEL NUTRITION STATUS   (None )  (None) Incontinent Diet (DYS 1)  PHYSICIAN VISITS COMMUNICATION OF NEEDS Height & Weight Skin    Verbally 5\' 5"  (165.1 cm) 181 lbs. PU Stage and Appropriate Care PU Stage 1 Dressing: No Dressing        AMBULATORY STATUS RESPIRATION    Assist extensive Normal      Personal Care Assistance Level of Assistance  Total care       Total Care Assistance: Maximum assistance    Functional Limitations Info   (None )     Referral needed to Palliative Care services.          SPECIAL CARE FACTORS FREQUENCY  PT (By licensed PT), OT (By licensed OT)  ST all eval and treat.     PT Frequency: 2 OT Frequency: 2           Additional Factors Info  Code Status, Allergies Code Status Info: DNR Allergies Info: Cymbalta, Lipitor, Metformin And Related, Simvastatin, Verapamil           Current Medications (01/15/2015): Current Facility-Administered Medications  Medication Dose Route Frequency Provider Last Rate Last Dose  . acetaminophen (TYLENOL) tablet 650 mg  650 mg Oral Q6H PRN Ivor Costa, MD       Or  . acetaminophen (TYLENOL) suppository 650 mg  650 mg Rectal Q6H PRN Ivor Costa, MD      . amLODipine (NORVASC) tablet 5 mg  5 mg Oral Daily Jonetta Osgood, MD   5 mg at 01/15/15 0957  .  aspirin chewable tablet 324 mg  324 mg Oral Daily Ivor Costa, MD   324 mg at 01/15/15 0820  . atorvastatin (LIPITOR) tablet 10 mg  10 mg Oral q1800 Allie Bossier, MD   10 mg at 01/14/15 1716  . divalproex (DEPAKOTE SPRINKLE) capsule 250 mg  250 mg Oral Q12H Jonetta Osgood, MD   250 mg at 01/15/15 0827  . docusate sodium (COLACE) capsule 100 mg  100 mg Oral Daily Ivor Costa, MD   100 mg at 01/15/15 0820  . enoxaparin (LOVENOX) injection 40 mg  40 mg Subcutaneous Q24H Donzetta Starch, NP   40 mg at 01/15/15 0820  . escitalopram (LEXAPRO) tablet 10 mg  10 mg Oral Daily Ivor Costa, MD   10 mg at  01/15/15 0820  . feeding supplement (ENSURE ENLIVE) (ENSURE ENLIVE) liquid 237 mL  237 mL Oral BID BM Rogue Bussing, RD   237 mL at 01/15/15 1235  . fluticasone (FLONASE) 50 MCG/ACT nasal spray 2 spray  2 spray Each Nare Daily Ivor Costa, MD   2 spray at 01/13/15 1000  . hydrALAZINE (APRESOLINE) injection 5 mg  5 mg Intravenous Q2H PRN Ivor Costa, MD   5 mg at 01/15/15 0837  . insulin aspart (novoLOG) injection 0-9 Units  0-9 Units Subcutaneous TID WC Ivor Costa, MD   1 Units at 01/15/15 1234  . magnesium hydroxide (MILK OF MAGNESIA) suspension 30 mL  30 mL Oral Daily PRN Ivor Costa, MD      . metoprolol tartrate (LOPRESSOR) 25 mg/10 mL oral suspension 75 mg  75 mg Oral BID Jonetta Osgood, MD      . multivitamin with minerals tablet 1 tablet  1 tablet Oral Daily Ivor Costa, MD   1 tablet at 01/15/15 [phone]  . ondansetron (ZOFRAN) tablet 4 mg  4 mg Oral Q6H PRN Ivor Costa, MD       Or  . ondansetron [facility]) injection 4 mg  4 mg Intravenous Q6H PRN Ivor Costa, MD      . pantoprazole (PROTONIX) EC tablet 40 mg  40 mg Oral Daily Jonetta Osgood, MD   40 mg at 01/15/15 2951  . sodium chloride 0.9 % injection 3 mL  3 mL Intravenous Q12H Ivor Costa, MD   3 mL at 01/14/15 2211  . vitamin B-12 (CYANOCOBALAMIN) tablet 1,000 mcg  1,000 mcg Oral Daily Ivor Costa, MD   1,000 mcg at 01/15/15 8841   Do not use this list as official medication orders. Please verify with discharge summary.  Discharge Medications:   Medication List    STOP taking these medications        loxapine 10 MG capsule  Commonly known as:  LOXITANE     metoprolol succinate 50 MG 24 hr tablet  Commonly known as:  TOPROL-XL      TAKE these medications        amLODipine 5 MG tablet  Commonly known as:  NORVASC  Take 5 mg by mouth daily.     aspirin 81 MG chewable tablet  Chew 4 tablets (324 mg total) by mouth daily.     atorvastatin 10 MG tablet  Commonly known as:  LIPITOR  Take 1 tablet (10 mg total) by mouth daily  at 6 PM.     divalproex 125 MG capsule  Commonly known as:  DEPAKOTE SPRINKLE  Take 2 capsules (250 mg total) by mouth every 12 (twelve) hours.     docusate sodium 100  MG capsule  Commonly known as:  COLACE  Take 1 capsule (100 mg total) by mouth daily.     escitalopram 5 MG tablet  Commonly known as:  LEXAPRO  Take 10 mg by mouth daily.     feeding supplement (ENSURE ENLIVE) Liqd  Take 237 mLs by mouth 2 (two) times daily between meals.     fluticasone 50 MCG/ACT nasal spray  Commonly known as:  FLONASE  Place 2 sprays into both nostrils daily.     insulin aspart 100 UNIT/ML injection  Commonly known as:  novoLOG  0-9 Units, Subcutaneous, 3 times daily with meals CBG < 70: implement hypoglycemia protocol CBG 70 - 120: 0 units CBG 121 - 150: 1 unit CBG 151 - 200: 2 units CBG 201 - 250: 3 units CBG 251 - 300: 5 units CBG 301 - 350: 7 units CBG 351 - 400: 9 units CBG > 400: call MD     magnesium hydroxide 400 MG/5ML suspension  Commonly known as:  MILK OF MAGNESIA  Take 30 mLs by mouth daily as needed for mild constipation.     metoprolol tartrate 25 mg/10 mL Susp  Commonly known as:  LOPRESSOR  Take 30 mLs (75 mg total) by mouth 2 (two) times daily.     pantoprazole 40 MG tablet  Commonly known as:  PROTONIX  Take 1 tablet (40 mg total) by mouth daily.     THERA Tabs  Take 1 tablet by mouth daily.     vitamin B-12 1000 MCG tablet  Commonly known as:  CYANOCOBALAMIN  Take 1,000 mcg by mouth daily.        Relevant Imaging Results:  Relevant Lab Results:  Recent Labs    Additional Information SS# [id number]  Williemae Area, [place]

## 2015-01-17 DIAGNOSIS — K5900 Constipation, unspecified: Secondary | ICD-10-CM | POA: Diagnosis not present

## 2015-01-17 LAB — CULTURE, BLOOD (ROUTINE X 2)
Culture: NO GROWTH
Culture: NO GROWTH

## 2015-01-25 DIAGNOSIS — N189 Chronic kidney disease, unspecified: Secondary | ICD-10-CM | POA: Diagnosis not present

## 2015-01-25 DIAGNOSIS — E1140 Type 2 diabetes mellitus with diabetic neuropathy, unspecified: Secondary | ICD-10-CM | POA: Diagnosis not present

## 2015-01-25 DIAGNOSIS — I129 Hypertensive chronic kidney disease with stage 1 through stage 4 chronic kidney disease, or unspecified chronic kidney disease: Secondary | ICD-10-CM | POA: Diagnosis not present

## 2015-01-25 DIAGNOSIS — F0280 Dementia in other diseases classified elsewhere without behavioral disturbance: Secondary | ICD-10-CM | POA: Diagnosis not present

## 2015-01-25 DIAGNOSIS — L89610 Pressure ulcer of right heel, unstageable: Secondary | ICD-10-CM | POA: Diagnosis not present

## 2015-01-25 DIAGNOSIS — G309 Alzheimer's disease, unspecified: Secondary | ICD-10-CM | POA: Diagnosis not present

## 2015-01-30 DIAGNOSIS — N189 Chronic kidney disease, unspecified: Secondary | ICD-10-CM | POA: Diagnosis not present

## 2015-01-30 DIAGNOSIS — F0280 Dementia in other diseases classified elsewhere without behavioral disturbance: Secondary | ICD-10-CM | POA: Diagnosis not present

## 2015-01-30 DIAGNOSIS — I129 Hypertensive chronic kidney disease with stage 1 through stage 4 chronic kidney disease, or unspecified chronic kidney disease: Secondary | ICD-10-CM | POA: Diagnosis not present

## 2015-01-30 DIAGNOSIS — L89610 Pressure ulcer of right heel, unstageable: Secondary | ICD-10-CM | POA: Diagnosis not present

## 2015-01-30 DIAGNOSIS — E1140 Type 2 diabetes mellitus with diabetic neuropathy, unspecified: Secondary | ICD-10-CM | POA: Diagnosis not present

## 2015-01-30 DIAGNOSIS — G309 Alzheimer's disease, unspecified: Secondary | ICD-10-CM | POA: Diagnosis not present

## 2015-02-02 DIAGNOSIS — R634 Abnormal weight loss: Secondary | ICD-10-CM | POA: Diagnosis not present

## 2015-02-02 DIAGNOSIS — N189 Chronic kidney disease, unspecified: Secondary | ICD-10-CM | POA: Diagnosis not present

## 2015-02-02 DIAGNOSIS — L89610 Pressure ulcer of right heel, unstageable: Secondary | ICD-10-CM | POA: Diagnosis not present

## 2015-02-02 DIAGNOSIS — G309 Alzheimer's disease, unspecified: Secondary | ICD-10-CM | POA: Diagnosis not present

## 2015-02-02 DIAGNOSIS — F0280 Dementia in other diseases classified elsewhere without behavioral disturbance: Secondary | ICD-10-CM | POA: Diagnosis not present

## 2015-02-02 DIAGNOSIS — I129 Hypertensive chronic kidney disease with stage 1 through stage 4 chronic kidney disease, or unspecified chronic kidney disease: Secondary | ICD-10-CM | POA: Diagnosis not present

## 2015-02-02 DIAGNOSIS — E1140 Type 2 diabetes mellitus with diabetic neuropathy, unspecified: Secondary | ICD-10-CM | POA: Diagnosis not present

## 2015-02-05 DIAGNOSIS — F0280 Dementia in other diseases classified elsewhere without behavioral disturbance: Secondary | ICD-10-CM | POA: Diagnosis not present

## 2015-02-05 DIAGNOSIS — E1140 Type 2 diabetes mellitus with diabetic neuropathy, unspecified: Secondary | ICD-10-CM | POA: Diagnosis not present

## 2015-02-05 DIAGNOSIS — L89610 Pressure ulcer of right heel, unstageable: Secondary | ICD-10-CM | POA: Diagnosis not present

## 2015-02-05 DIAGNOSIS — N189 Chronic kidney disease, unspecified: Secondary | ICD-10-CM | POA: Diagnosis not present

## 2015-02-05 DIAGNOSIS — G309 Alzheimer's disease, unspecified: Secondary | ICD-10-CM | POA: Diagnosis not present

## 2015-02-05 DIAGNOSIS — I129 Hypertensive chronic kidney disease with stage 1 through stage 4 chronic kidney disease, or unspecified chronic kidney disease: Secondary | ICD-10-CM | POA: Diagnosis not present

## 2015-02-06 ENCOUNTER — Ambulatory Visit (INDEPENDENT_AMBULATORY_CARE_PROVIDER_SITE_OTHER): Payer: Medicare Other | Admitting: Diagnostic Neuroimaging

## 2015-02-06 ENCOUNTER — Encounter: Payer: Self-pay | Admitting: Diagnostic Neuroimaging

## 2015-02-06 VITALS — BP 138/76 | HR 82 | Resp 16 | Ht 69.0 in | Wt 167.0 lb

## 2015-02-06 DIAGNOSIS — G219 Secondary parkinsonism, unspecified: Secondary | ICD-10-CM

## 2015-02-06 DIAGNOSIS — N189 Chronic kidney disease, unspecified: Secondary | ICD-10-CM | POA: Diagnosis not present

## 2015-02-06 DIAGNOSIS — I129 Hypertensive chronic kidney disease with stage 1 through stage 4 chronic kidney disease, or unspecified chronic kidney disease: Secondary | ICD-10-CM | POA: Diagnosis not present

## 2015-02-06 DIAGNOSIS — F0391 Unspecified dementia with behavioral disturbance: Secondary | ICD-10-CM | POA: Diagnosis not present

## 2015-02-06 DIAGNOSIS — I63411 Cerebral infarction due to embolism of right middle cerebral artery: Secondary | ICD-10-CM | POA: Diagnosis not present

## 2015-02-06 DIAGNOSIS — E1140 Type 2 diabetes mellitus with diabetic neuropathy, unspecified: Secondary | ICD-10-CM | POA: Diagnosis not present

## 2015-02-06 DIAGNOSIS — G629 Polyneuropathy, unspecified: Secondary | ICD-10-CM | POA: Insufficient documentation

## 2015-02-06 DIAGNOSIS — I639 Cerebral infarction, unspecified: Secondary | ICD-10-CM | POA: Diagnosis not present

## 2015-02-06 DIAGNOSIS — F03B18 Unspecified dementia, moderate, with other behavioral disturbance: Secondary | ICD-10-CM

## 2015-02-06 DIAGNOSIS — L84 Corns and callosities: Secondary | ICD-10-CM | POA: Insufficient documentation

## 2015-02-06 DIAGNOSIS — F0280 Dementia in other diseases classified elsewhere without behavioral disturbance: Secondary | ICD-10-CM | POA: Diagnosis not present

## 2015-02-06 DIAGNOSIS — G309 Alzheimer's disease, unspecified: Secondary | ICD-10-CM | POA: Diagnosis not present

## 2015-02-06 DIAGNOSIS — R42 Dizziness and giddiness: Secondary | ICD-10-CM | POA: Insufficient documentation

## 2015-02-06 DIAGNOSIS — L89610 Pressure ulcer of right heel, unstageable: Secondary | ICD-10-CM | POA: Diagnosis not present

## 2015-02-06 NOTE — Patient Instructions (Signed)
Follow up with palliative care and PCP.

## 2015-02-06 NOTE — Progress Notes (Signed)
[facility]  PATIENT: ARANTXA CARDIN DOB: January 05, 1928  REFERRING CLINICIAN: Alexander HISTORY FROM: patient and daughter in law REASON FOR VISIT: new consult    HISTORICAL  CHIEF COMPLAINT:  Chief Complaint  Patient presents with  . Drug Induced Parkinson's Disease    Dtr.-in-law sts. since last ov, pt's speech is better, eyes are open more, appetite is better.  Pt. sts. she feels better.  Since last ov, Loxapine has been d/c, and pt. has been hospitalized for cva.  Pt. not using her hands as much.  PT and OT are currently working with her at [facility]:   UPDATE 02/06/15: Since last visit, loxapine dose was tapered off and she has been more awake and alert. Until 01/11/15, found to have tachycardia and clammy appearance. Went to ER and had afib with RVR. Also found to have small right frontal infarct. Now living at [facility] again. Overall doing better than last visit, but still has significant needs re: her ADLs.   PRIOR HPI (01/03/15): 81 year old female here for evaluation of gait difficulty and falls. August 2016 patient was living alone, fell and hit her head. She was having significant depression and suicidal thoughts. She was then admitted for psychiatry admission. On September 1 she was discharged to [facility] skilled nursing facility. During this timeframe patient was started on antipsychotic medication loxapine and Lexapro. Over time patient has had significant psychomotor slowing, stooped posture, slow short stepped gait, tremor. Therefore patient was referred to me for evaluation of possible parkinsonism. Apparently other medical providers have noticed the symptoms and several recommendations to wean off of loxapine were made as this may have caused drug induced parkinsonism. Patient is being managed by psychiatry and they are planning to reduce this medication.   REVIEW OF SYSTEMS: Full 14 system review  of systems performed and notable only for confusion weakness tremor decreased energy change in appetite discharge activity suicidal thoughts hallucinations sauce be head droop incontinence weight loss.  ALLERGIES: Allergies  Allergen Reactions  . Cymbalta [Duloxetine Hcl] Other (See Comments)    Listed on MAR  . Lipitor [Atorvastatin] Other (See Comments)    Listed on MAR  . Metformin And Related Other (See Comments)    Listed on MAR  . Simvastatin Other (See Comments)    Listed on MAR  . Verapamil     Sinus arrest    HOME MEDICATIONS: Outpatient Prescriptions Prior to Visit  Medication Sig Dispense Refill  . amLODipine (NORVASC) 5 MG tablet Take 5 mg by mouth daily.    Marland Kitchen aspirin 81 MG chewable tablet Chew 4 tablets (324 mg total) by mouth daily.    . divalproex (DEPAKOTE SPRINKLE) 125 MG capsule Take 2 capsules (250 mg total) by mouth every 12 (twelve) hours.    . docusate sodium (COLACE) 100 MG capsule Take 1 capsule (100 mg total) by mouth daily. 10 capsule 0  . escitalopram (LEXAPRO) 5 MG tablet Take 10 mg by mouth daily.     . feeding supplement, ENSURE ENLIVE, (ENSURE ENLIVE) LIQD Take 237 mLs by mouth 2 (two) times daily between meals.    . fluticasone (FLONASE) 50 MCG/ACT nasal spray Place 2 sprays into both nostrils daily.    . insulin aspart (NOVOLOG) 100 UNIT/ML injection 0-9 Units, Subcutaneous, 3 times daily with meals CBG < 70: implement hypoglycemia protocol CBG 70 - 120: 0 units CBG 121 - 150: 1 unit CBG 151 - 200: 2 units  CBG 201 - 250: 3 units CBG 251 - 300: 5 units CBG 301 - 350: 7 units CBG 351 - 400: 9 units CBG > 400: call MD    . metoprolol tartrate (LOPRESSOR) 25 mg/10 mL SUSP Take 30 mLs (75 mg total) by mouth 2 (two) times daily.    . pantoprazole (PROTONIX) 40 MG tablet Take 1 tablet (40 mg total) by mouth daily.    . vitamin B-12 (CYANOCOBALAMIN) 1000 MCG tablet Take 1,000 mcg by mouth daily.    Marland Kitchen atorvastatin (LIPITOR) 10 MG tablet Take 1 tablet  (10 mg total) by mouth daily at 6 PM. (Patient not taking: Reported on 02/06/2015)    . magnesium hydroxide (MILK OF MAGNESIA) 400 MG/5ML suspension Take 30 mLs by mouth daily as needed for mild constipation.    . Multiple Vitamin (THERA) TABS Take 1 tablet by mouth daily.     No facility-administered medications prior to visit.     PAST MEDICAL HISTORY: Past Medical History  Diagnosis Date  . Hypertension   . Alzheimer disease   . Fibromyalgia   . Atrial fibrillation ([place])   . Colon polyp   . GERD (gastroesophageal reflux disease)   . Diabetes mellitus without complication ([place])   . Neuropathy ([place])   . HOCM (hypertrophic obstructive cardiomyopathy) ([place])   . Chronic kidney disease     PAST SURGICAL HISTORY: Past Surgical History  Procedure Laterality Date  . Shoulder surgery Left     rotator cuff    FAMILY HISTORY: Family History  Problem Relation Age of Onset  . Stroke Mother   . Diabetes Mellitus II Maternal Grandmother     SOCIAL HISTORY:  Social History   Social History  . Marital Status: Widowed    Spouse Name: N/A  . Number of Children: 4  . Years of Education: 12   Occupational History  . Not on file.   Social History Main Topics  . Smoking status: Never Smoker   . Smokeless tobacco: Not on file  . Alcohol Use: No  . Drug Use: No  . Sexual Activity: Not on file   Other Topics Concern  . Not on file   Social History Narrative   Lives in SNF, [facility] , widowed, lived at home until 4-5 weeks ago   No caffeine     PHYSICAL EXAM  GENERAL EXAM/CONSTITUTIONAL: Vitals:  Filed Vitals:   02/06/15 1123  BP: 138/76  Pulse: 82  Resp: 16  Height: 5\' 9"  (1.753 m)  Weight: 167 lb (75.751 kg)   Body mass index is 24.65 kg/(m^2). No exam data present  Patient is in no distress; well developed, nourished and groomed; neck is supple  CARDIOVASCULAR:  Examination of carotid arteries is normal; no carotid bruits  Regular rate and  rhythm, no murmurs   MUSCULOSKELETAL:  Gait, strength, tone, movements noted in Neurologic exam below  NEUROLOGIC: MENTAL STATUS:  No flowsheet data found.  EYES CLOSED; ABLE TO OPEN EYES TO VOICE   ABLE TO SPEAK IN SHORT PHRASES  DECR MEMORY; TELLS ME THAT SHE IS 81 YEARS OLD, BORN ON "07/14/1998"  DECR ATTENTION  DECR FLUENCY; DECR COMPREHENSION; DECR NAMING   DECR FUND OF KNOWLEDGE  CRANIAL NERVE:   2nd, 3rd, 4th, 6th - pupils equal and reactive to light; NOT FOLLOWING COMMANDS FOR VISUAL FIELD OR EYE MOVEMENT TESTING  5th - facial sensation symmetric  7th - facial strength symmetric  8th - hearing intact  9th - uvula testing -->  NOT FOLLOWING COMMANDS  11th - shoulder shrug --> NOT FOLLOWING COMMANDS  12th - tongue protrusion --> NOT FOLLOWING COMMANDS  MOTOR:   DIFFUSE WEAKNESS IN BUE AND BLE; BARELY ANTI-GRAVITY; INTERMITTENT REST TREMOR IN BUE  SENSORY:   normal and symmetric to light touch  COORDINATION:   finger-nose-finger, fine finger movements --> SLOW  REFLEXES:   deep tendon reflexes TRACE symmetric  GAIT/STATION:   STOOPED POSTURE, IN WHEELCHAIR    DIAGNOSTIC DATA (LABS, IMAGING, TESTING) - I reviewed patient records, labs, notes, testing and imaging myself where available.  Lab Results  Component Value Date   WBC 9.4 01/14/2015   HGB 11.5* 01/14/2015   HCT 35.6* 01/14/2015   MCV 90.8 01/14/2015   PLT 278 01/14/2015      Component Value Date/Time   NA 138 01/15/2015 0518   K 4.0 01/15/2015 0518   CL 113* 01/15/2015 0518   CO2 18* 01/15/2015 0518   GLUCOSE 139* 01/15/2015 0518   BUN 7 01/15/2015 0518   CREATININE 0.69 01/15/2015 0518   CALCIUM 8.9 01/15/2015 0518   PROT 4.9* 01/14/2015 0154   ALBUMIN 2.0* 01/14/2015 0154   AST 25 01/14/2015 0154   ALT 31 01/14/2015 0154   ALKPHOS 93 01/14/2015 0154   BILITOT 1.1 01/14/2015 0154   GFRNONAA >60 01/15/2015 0518   GFRAA >60 01/15/2015 0518   Lab Results  Component  Value Date   CHOL 217* 01/12/2015   HDL 20* 01/12/2015   LDLCALC 156* 01/12/2015   TRIG 207* 01/12/2015   CHOLHDL 10.9 01/12/2015   Lab Results  Component Value Date   HGBA1C 6.1* 01/12/2015   No results found for: [id number] Lab Results  Component Value Date   TSH 3.421 01/12/2015     12/18/14 CT head (without) [I reviewed images myself and agree with interpretation. -VRP]  1. No acute finding. 2. Mild generalized atrophy. Moderate to advanced chronic microvascular ischemic change.  01/12/15 MRI brain [I reviewed images myself and agree with interpretation. Also there is moderate-severe atrophy. -VRP]  - 7 mm acute infarct RIGHT posterior frontal lobe. - Chronic changes including severe chronic small vessel ischemic disease and old bilateral basal ganglia/ thalamus lacunar infarcts, tiny inferior cerebellar lacunar infarcts.    ASSESSMENT AND PLAN  81 y.o. year old female here with progressive psychomotor slowing and catatonia with parkinsonism symptoms, most likely related to medication side effect of loxapine. Some improvement with stopping loxapine. Now with small right brain infarct in setting of afib with RVR in early Nov 2016. Overall, her symptoms are mainly due to neurodegenerative dementia (vascular vs DLB).  Ddx of parkinsonism: drug-induced parkinsonism, vascular parkinsonism, dementia with lewy bodies  Ddx of dementia: vascular vs DLB   Moderate dementia with behavioral disturbance  Secondary Parkinsonism, unspecified secondary Parkinsonism type ([place])  Cerebrovascular accident (CVA) due to embolism of right middle cerebral artery ([place])     PLAN: I spent 15 minutes of face to face time with patient. Greater than 50% of time was spent in counseling and coordination of care with patient. In summary we discussed:  - follow up with PCP and palliative care - no further neurologic testing/treatment advised  Return for return to PCP.    Penni Bombard,  MD [id number], 0000000 PM Certified in Neurology, Neurophysiology and Neuroimaging  [facility] [address], [place], [place] [address] [phone]

## 2015-02-07 DIAGNOSIS — N189 Chronic kidney disease, unspecified: Secondary | ICD-10-CM | POA: Diagnosis not present

## 2015-02-07 DIAGNOSIS — G309 Alzheimer's disease, unspecified: Secondary | ICD-10-CM | POA: Diagnosis not present

## 2015-02-07 DIAGNOSIS — E1140 Type 2 diabetes mellitus with diabetic neuropathy, unspecified: Secondary | ICD-10-CM | POA: Diagnosis not present

## 2015-02-07 DIAGNOSIS — I129 Hypertensive chronic kidney disease with stage 1 through stage 4 chronic kidney disease, or unspecified chronic kidney disease: Secondary | ICD-10-CM | POA: Diagnosis not present

## 2015-02-07 DIAGNOSIS — L89610 Pressure ulcer of right heel, unstageable: Secondary | ICD-10-CM | POA: Diagnosis not present

## 2015-02-07 DIAGNOSIS — F0280 Dementia in other diseases classified elsewhere without behavioral disturbance: Secondary | ICD-10-CM | POA: Diagnosis not present

## 2015-02-08 DIAGNOSIS — I639 Cerebral infarction, unspecified: Secondary | ICD-10-CM | POA: Diagnosis not present

## 2015-02-08 DIAGNOSIS — I10 Essential (primary) hypertension: Secondary | ICD-10-CM | POA: Diagnosis not present

## 2015-02-08 DIAGNOSIS — E1140 Type 2 diabetes mellitus with diabetic neuropathy, unspecified: Secondary | ICD-10-CM | POA: Diagnosis not present

## 2015-02-08 DIAGNOSIS — F0280 Dementia in other diseases classified elsewhere without behavioral disturbance: Secondary | ICD-10-CM | POA: Diagnosis not present

## 2015-02-08 DIAGNOSIS — G309 Alzheimer's disease, unspecified: Secondary | ICD-10-CM | POA: Diagnosis not present

## 2015-02-08 DIAGNOSIS — I129 Hypertensive chronic kidney disease with stage 1 through stage 4 chronic kidney disease, or unspecified chronic kidney disease: Secondary | ICD-10-CM | POA: Diagnosis not present

## 2015-02-08 DIAGNOSIS — N189 Chronic kidney disease, unspecified: Secondary | ICD-10-CM | POA: Diagnosis not present

## 2015-02-08 DIAGNOSIS — I4891 Unspecified atrial fibrillation: Secondary | ICD-10-CM | POA: Diagnosis not present

## 2015-02-08 DIAGNOSIS — L89610 Pressure ulcer of right heel, unstageable: Secondary | ICD-10-CM | POA: Diagnosis not present

## 2015-02-09 DIAGNOSIS — N189 Chronic kidney disease, unspecified: Secondary | ICD-10-CM | POA: Diagnosis not present

## 2015-02-09 DIAGNOSIS — E1140 Type 2 diabetes mellitus with diabetic neuropathy, unspecified: Secondary | ICD-10-CM | POA: Diagnosis not present

## 2015-02-09 DIAGNOSIS — I129 Hypertensive chronic kidney disease with stage 1 through stage 4 chronic kidney disease, or unspecified chronic kidney disease: Secondary | ICD-10-CM | POA: Diagnosis not present

## 2015-02-09 DIAGNOSIS — L89610 Pressure ulcer of right heel, unstageable: Secondary | ICD-10-CM | POA: Diagnosis not present

## 2015-02-09 DIAGNOSIS — G309 Alzheimer's disease, unspecified: Secondary | ICD-10-CM | POA: Diagnosis not present

## 2015-02-09 DIAGNOSIS — F0280 Dementia in other diseases classified elsewhere without behavioral disturbance: Secondary | ICD-10-CM | POA: Diagnosis not present

## 2015-02-12 DIAGNOSIS — F0280 Dementia in other diseases classified elsewhere without behavioral disturbance: Secondary | ICD-10-CM | POA: Diagnosis not present

## 2015-02-12 DIAGNOSIS — G309 Alzheimer's disease, unspecified: Secondary | ICD-10-CM | POA: Diagnosis not present

## 2015-02-12 DIAGNOSIS — I129 Hypertensive chronic kidney disease with stage 1 through stage 4 chronic kidney disease, or unspecified chronic kidney disease: Secondary | ICD-10-CM | POA: Diagnosis not present

## 2015-02-12 DIAGNOSIS — E1140 Type 2 diabetes mellitus with diabetic neuropathy, unspecified: Secondary | ICD-10-CM | POA: Diagnosis not present

## 2015-02-12 DIAGNOSIS — N189 Chronic kidney disease, unspecified: Secondary | ICD-10-CM | POA: Diagnosis not present

## 2015-02-12 DIAGNOSIS — L89610 Pressure ulcer of right heel, unstageable: Secondary | ICD-10-CM | POA: Diagnosis not present

## 2015-02-13 DIAGNOSIS — L89610 Pressure ulcer of right heel, unstageable: Secondary | ICD-10-CM | POA: Diagnosis not present

## 2015-02-13 DIAGNOSIS — G309 Alzheimer's disease, unspecified: Secondary | ICD-10-CM | POA: Diagnosis not present

## 2015-02-13 DIAGNOSIS — E1140 Type 2 diabetes mellitus with diabetic neuropathy, unspecified: Secondary | ICD-10-CM | POA: Diagnosis not present

## 2015-02-13 DIAGNOSIS — F0280 Dementia in other diseases classified elsewhere without behavioral disturbance: Secondary | ICD-10-CM | POA: Diagnosis not present

## 2015-02-13 DIAGNOSIS — I129 Hypertensive chronic kidney disease with stage 1 through stage 4 chronic kidney disease, or unspecified chronic kidney disease: Secondary | ICD-10-CM | POA: Diagnosis not present

## 2015-02-13 DIAGNOSIS — N189 Chronic kidney disease, unspecified: Secondary | ICD-10-CM | POA: Diagnosis not present

## 2015-02-14 DIAGNOSIS — L89610 Pressure ulcer of right heel, unstageable: Secondary | ICD-10-CM | POA: Diagnosis not present

## 2015-02-14 DIAGNOSIS — E1140 Type 2 diabetes mellitus with diabetic neuropathy, unspecified: Secondary | ICD-10-CM | POA: Diagnosis not present

## 2015-02-14 DIAGNOSIS — N189 Chronic kidney disease, unspecified: Secondary | ICD-10-CM | POA: Diagnosis not present

## 2015-02-14 DIAGNOSIS — F0280 Dementia in other diseases classified elsewhere without behavioral disturbance: Secondary | ICD-10-CM | POA: Diagnosis not present

## 2015-02-14 DIAGNOSIS — G309 Alzheimer's disease, unspecified: Secondary | ICD-10-CM | POA: Diagnosis not present

## 2015-02-14 DIAGNOSIS — I129 Hypertensive chronic kidney disease with stage 1 through stage 4 chronic kidney disease, or unspecified chronic kidney disease: Secondary | ICD-10-CM | POA: Diagnosis not present

## 2015-02-15 DIAGNOSIS — N189 Chronic kidney disease, unspecified: Secondary | ICD-10-CM | POA: Diagnosis not present

## 2015-02-15 DIAGNOSIS — G309 Alzheimer's disease, unspecified: Secondary | ICD-10-CM | POA: Diagnosis not present

## 2015-02-15 DIAGNOSIS — L89610 Pressure ulcer of right heel, unstageable: Secondary | ICD-10-CM | POA: Diagnosis not present

## 2015-02-15 DIAGNOSIS — F0280 Dementia in other diseases classified elsewhere without behavioral disturbance: Secondary | ICD-10-CM | POA: Diagnosis not present

## 2015-02-15 DIAGNOSIS — E1140 Type 2 diabetes mellitus with diabetic neuropathy, unspecified: Secondary | ICD-10-CM | POA: Diagnosis not present

## 2015-02-15 DIAGNOSIS — I129 Hypertensive chronic kidney disease with stage 1 through stage 4 chronic kidney disease, or unspecified chronic kidney disease: Secondary | ICD-10-CM | POA: Diagnosis not present

## 2015-02-16 DIAGNOSIS — N189 Chronic kidney disease, unspecified: Secondary | ICD-10-CM | POA: Diagnosis not present

## 2015-02-16 DIAGNOSIS — G309 Alzheimer's disease, unspecified: Secondary | ICD-10-CM | POA: Diagnosis not present

## 2015-02-16 DIAGNOSIS — E1140 Type 2 diabetes mellitus with diabetic neuropathy, unspecified: Secondary | ICD-10-CM | POA: Diagnosis not present

## 2015-02-16 DIAGNOSIS — L89610 Pressure ulcer of right heel, unstageable: Secondary | ICD-10-CM | POA: Diagnosis not present

## 2015-02-16 DIAGNOSIS — F0280 Dementia in other diseases classified elsewhere without behavioral disturbance: Secondary | ICD-10-CM | POA: Diagnosis not present

## 2015-02-16 DIAGNOSIS — I129 Hypertensive chronic kidney disease with stage 1 through stage 4 chronic kidney disease, or unspecified chronic kidney disease: Secondary | ICD-10-CM | POA: Diagnosis not present

## 2015-02-19 DIAGNOSIS — F0280 Dementia in other diseases classified elsewhere without behavioral disturbance: Secondary | ICD-10-CM | POA: Diagnosis not present

## 2015-02-19 DIAGNOSIS — G309 Alzheimer's disease, unspecified: Secondary | ICD-10-CM | POA: Diagnosis not present

## 2015-02-19 DIAGNOSIS — I129 Hypertensive chronic kidney disease with stage 1 through stage 4 chronic kidney disease, or unspecified chronic kidney disease: Secondary | ICD-10-CM | POA: Diagnosis not present

## 2015-02-19 DIAGNOSIS — L89610 Pressure ulcer of right heel, unstageable: Secondary | ICD-10-CM | POA: Diagnosis not present

## 2015-02-19 DIAGNOSIS — E1140 Type 2 diabetes mellitus with diabetic neuropathy, unspecified: Secondary | ICD-10-CM | POA: Diagnosis not present

## 2015-02-19 DIAGNOSIS — N189 Chronic kidney disease, unspecified: Secondary | ICD-10-CM | POA: Diagnosis not present

## 2015-02-20 DIAGNOSIS — N189 Chronic kidney disease, unspecified: Secondary | ICD-10-CM | POA: Diagnosis not present

## 2015-02-20 DIAGNOSIS — G309 Alzheimer's disease, unspecified: Secondary | ICD-10-CM | POA: Diagnosis not present

## 2015-02-20 DIAGNOSIS — L89610 Pressure ulcer of right heel, unstageable: Secondary | ICD-10-CM | POA: Diagnosis not present

## 2015-02-20 DIAGNOSIS — I129 Hypertensive chronic kidney disease with stage 1 through stage 4 chronic kidney disease, or unspecified chronic kidney disease: Secondary | ICD-10-CM | POA: Diagnosis not present

## 2015-02-20 DIAGNOSIS — E1140 Type 2 diabetes mellitus with diabetic neuropathy, unspecified: Secondary | ICD-10-CM | POA: Diagnosis not present

## 2015-02-20 DIAGNOSIS — F0280 Dementia in other diseases classified elsewhere without behavioral disturbance: Secondary | ICD-10-CM | POA: Diagnosis not present

## 2015-02-21 DIAGNOSIS — E1140 Type 2 diabetes mellitus with diabetic neuropathy, unspecified: Secondary | ICD-10-CM | POA: Diagnosis not present

## 2015-02-21 DIAGNOSIS — L89610 Pressure ulcer of right heel, unstageable: Secondary | ICD-10-CM | POA: Diagnosis not present

## 2015-02-21 DIAGNOSIS — G309 Alzheimer's disease, unspecified: Secondary | ICD-10-CM | POA: Diagnosis not present

## 2015-02-21 DIAGNOSIS — F0280 Dementia in other diseases classified elsewhere without behavioral disturbance: Secondary | ICD-10-CM | POA: Diagnosis not present

## 2015-02-21 DIAGNOSIS — I129 Hypertensive chronic kidney disease with stage 1 through stage 4 chronic kidney disease, or unspecified chronic kidney disease: Secondary | ICD-10-CM | POA: Diagnosis not present

## 2015-02-21 DIAGNOSIS — N189 Chronic kidney disease, unspecified: Secondary | ICD-10-CM | POA: Diagnosis not present

## 2015-02-22 DIAGNOSIS — G309 Alzheimer's disease, unspecified: Secondary | ICD-10-CM | POA: Diagnosis not present

## 2015-02-22 DIAGNOSIS — L89610 Pressure ulcer of right heel, unstageable: Secondary | ICD-10-CM | POA: Diagnosis not present

## 2015-02-22 DIAGNOSIS — I129 Hypertensive chronic kidney disease with stage 1 through stage 4 chronic kidney disease, or unspecified chronic kidney disease: Secondary | ICD-10-CM | POA: Diagnosis not present

## 2015-02-22 DIAGNOSIS — N189 Chronic kidney disease, unspecified: Secondary | ICD-10-CM | POA: Diagnosis not present

## 2015-02-22 DIAGNOSIS — E1140 Type 2 diabetes mellitus with diabetic neuropathy, unspecified: Secondary | ICD-10-CM | POA: Diagnosis not present

## 2015-02-22 DIAGNOSIS — F0280 Dementia in other diseases classified elsewhere without behavioral disturbance: Secondary | ICD-10-CM | POA: Diagnosis not present

## 2015-02-23 DIAGNOSIS — L89610 Pressure ulcer of right heel, unstageable: Secondary | ICD-10-CM | POA: Diagnosis not present

## 2015-02-23 DIAGNOSIS — G309 Alzheimer's disease, unspecified: Secondary | ICD-10-CM | POA: Diagnosis not present

## 2015-02-23 DIAGNOSIS — E1140 Type 2 diabetes mellitus with diabetic neuropathy, unspecified: Secondary | ICD-10-CM | POA: Diagnosis not present

## 2015-02-23 DIAGNOSIS — I129 Hypertensive chronic kidney disease with stage 1 through stage 4 chronic kidney disease, or unspecified chronic kidney disease: Secondary | ICD-10-CM | POA: Diagnosis not present

## 2015-02-23 DIAGNOSIS — F0280 Dementia in other diseases classified elsewhere without behavioral disturbance: Secondary | ICD-10-CM | POA: Diagnosis not present

## 2015-02-23 DIAGNOSIS — N189 Chronic kidney disease, unspecified: Secondary | ICD-10-CM | POA: Diagnosis not present

## 2015-02-26 DIAGNOSIS — I129 Hypertensive chronic kidney disease with stage 1 through stage 4 chronic kidney disease, or unspecified chronic kidney disease: Secondary | ICD-10-CM | POA: Diagnosis not present

## 2015-02-26 DIAGNOSIS — L89610 Pressure ulcer of right heel, unstageable: Secondary | ICD-10-CM | POA: Diagnosis not present

## 2015-02-26 DIAGNOSIS — E1140 Type 2 diabetes mellitus with diabetic neuropathy, unspecified: Secondary | ICD-10-CM | POA: Diagnosis not present

## 2015-02-26 DIAGNOSIS — G309 Alzheimer's disease, unspecified: Secondary | ICD-10-CM | POA: Diagnosis not present

## 2015-02-26 DIAGNOSIS — N189 Chronic kidney disease, unspecified: Secondary | ICD-10-CM | POA: Diagnosis not present

## 2015-02-26 DIAGNOSIS — F0280 Dementia in other diseases classified elsewhere without behavioral disturbance: Secondary | ICD-10-CM | POA: Diagnosis not present

## 2015-02-28 DIAGNOSIS — L89610 Pressure ulcer of right heel, unstageable: Secondary | ICD-10-CM | POA: Diagnosis not present

## 2015-02-28 DIAGNOSIS — F0280 Dementia in other diseases classified elsewhere without behavioral disturbance: Secondary | ICD-10-CM | POA: Diagnosis not present

## 2015-02-28 DIAGNOSIS — I129 Hypertensive chronic kidney disease with stage 1 through stage 4 chronic kidney disease, or unspecified chronic kidney disease: Secondary | ICD-10-CM | POA: Diagnosis not present

## 2015-02-28 DIAGNOSIS — G309 Alzheimer's disease, unspecified: Secondary | ICD-10-CM | POA: Diagnosis not present

## 2015-02-28 DIAGNOSIS — E1140 Type 2 diabetes mellitus with diabetic neuropathy, unspecified: Secondary | ICD-10-CM | POA: Diagnosis not present

## 2015-02-28 DIAGNOSIS — R109 Unspecified abdominal pain: Secondary | ICD-10-CM | POA: Diagnosis not present

## 2015-02-28 DIAGNOSIS — N189 Chronic kidney disease, unspecified: Secondary | ICD-10-CM | POA: Diagnosis not present

## 2015-03-02 DIAGNOSIS — I129 Hypertensive chronic kidney disease with stage 1 through stage 4 chronic kidney disease, or unspecified chronic kidney disease: Secondary | ICD-10-CM | POA: Diagnosis not present

## 2015-03-02 DIAGNOSIS — G309 Alzheimer's disease, unspecified: Secondary | ICD-10-CM | POA: Diagnosis not present

## 2015-03-02 DIAGNOSIS — N189 Chronic kidney disease, unspecified: Secondary | ICD-10-CM | POA: Diagnosis not present

## 2015-03-02 DIAGNOSIS — E1140 Type 2 diabetes mellitus with diabetic neuropathy, unspecified: Secondary | ICD-10-CM | POA: Diagnosis not present

## 2015-03-02 DIAGNOSIS — F0280 Dementia in other diseases classified elsewhere without behavioral disturbance: Secondary | ICD-10-CM | POA: Diagnosis not present

## 2015-03-02 DIAGNOSIS — L89610 Pressure ulcer of right heel, unstageable: Secondary | ICD-10-CM | POA: Diagnosis not present

## 2015-03-05 DIAGNOSIS — G309 Alzheimer's disease, unspecified: Secondary | ICD-10-CM | POA: Diagnosis not present

## 2015-03-05 DIAGNOSIS — I129 Hypertensive chronic kidney disease with stage 1 through stage 4 chronic kidney disease, or unspecified chronic kidney disease: Secondary | ICD-10-CM | POA: Diagnosis not present

## 2015-03-05 DIAGNOSIS — F0280 Dementia in other diseases classified elsewhere without behavioral disturbance: Secondary | ICD-10-CM | POA: Diagnosis not present

## 2015-03-05 DIAGNOSIS — L89610 Pressure ulcer of right heel, unstageable: Secondary | ICD-10-CM | POA: Diagnosis not present

## 2015-03-05 DIAGNOSIS — E1140 Type 2 diabetes mellitus with diabetic neuropathy, unspecified: Secondary | ICD-10-CM | POA: Diagnosis not present

## 2015-03-05 DIAGNOSIS — N189 Chronic kidney disease, unspecified: Secondary | ICD-10-CM | POA: Diagnosis not present

## 2015-03-07 DIAGNOSIS — E1140 Type 2 diabetes mellitus with diabetic neuropathy, unspecified: Secondary | ICD-10-CM | POA: Diagnosis not present

## 2015-03-07 DIAGNOSIS — I129 Hypertensive chronic kidney disease with stage 1 through stage 4 chronic kidney disease, or unspecified chronic kidney disease: Secondary | ICD-10-CM | POA: Diagnosis not present

## 2015-03-07 DIAGNOSIS — G309 Alzheimer's disease, unspecified: Secondary | ICD-10-CM | POA: Diagnosis not present

## 2015-03-07 DIAGNOSIS — N189 Chronic kidney disease, unspecified: Secondary | ICD-10-CM | POA: Diagnosis not present

## 2015-03-07 DIAGNOSIS — F0280 Dementia in other diseases classified elsewhere without behavioral disturbance: Secondary | ICD-10-CM | POA: Diagnosis not present

## 2015-03-07 DIAGNOSIS — L89610 Pressure ulcer of right heel, unstageable: Secondary | ICD-10-CM | POA: Diagnosis not present

## 2015-03-08 DIAGNOSIS — L89610 Pressure ulcer of right heel, unstageable: Secondary | ICD-10-CM | POA: Diagnosis not present

## 2015-03-08 DIAGNOSIS — G309 Alzheimer's disease, unspecified: Secondary | ICD-10-CM | POA: Diagnosis not present

## 2015-03-08 DIAGNOSIS — E1140 Type 2 diabetes mellitus with diabetic neuropathy, unspecified: Secondary | ICD-10-CM | POA: Diagnosis not present

## 2015-03-08 DIAGNOSIS — I129 Hypertensive chronic kidney disease with stage 1 through stage 4 chronic kidney disease, or unspecified chronic kidney disease: Secondary | ICD-10-CM | POA: Diagnosis not present

## 2015-03-08 DIAGNOSIS — N189 Chronic kidney disease, unspecified: Secondary | ICD-10-CM | POA: Diagnosis not present

## 2015-03-08 DIAGNOSIS — F0280 Dementia in other diseases classified elsewhere without behavioral disturbance: Secondary | ICD-10-CM | POA: Diagnosis not present

## 2015-03-09 DIAGNOSIS — N189 Chronic kidney disease, unspecified: Secondary | ICD-10-CM | POA: Diagnosis not present

## 2015-03-09 DIAGNOSIS — G309 Alzheimer's disease, unspecified: Secondary | ICD-10-CM | POA: Diagnosis not present

## 2015-03-09 DIAGNOSIS — E1140 Type 2 diabetes mellitus with diabetic neuropathy, unspecified: Secondary | ICD-10-CM | POA: Diagnosis not present

## 2015-03-09 DIAGNOSIS — F0280 Dementia in other diseases classified elsewhere without behavioral disturbance: Secondary | ICD-10-CM | POA: Diagnosis not present

## 2015-03-09 DIAGNOSIS — I129 Hypertensive chronic kidney disease with stage 1 through stage 4 chronic kidney disease, or unspecified chronic kidney disease: Secondary | ICD-10-CM | POA: Diagnosis not present

## 2015-03-09 DIAGNOSIS — L89610 Pressure ulcer of right heel, unstageable: Secondary | ICD-10-CM | POA: Diagnosis not present

## 2015-03-11 DIAGNOSIS — I69391 Dysphagia following cerebral infarction: Secondary | ICD-10-CM | POA: Diagnosis not present

## 2015-03-11 DIAGNOSIS — G309 Alzheimer's disease, unspecified: Secondary | ICD-10-CM | POA: Diagnosis not present

## 2015-03-11 DIAGNOSIS — L89152 Pressure ulcer of sacral region, stage 2: Secondary | ICD-10-CM | POA: Diagnosis not present

## 2015-03-11 DIAGNOSIS — L89610 Pressure ulcer of right heel, unstageable: Secondary | ICD-10-CM | POA: Diagnosis not present

## 2015-03-11 DIAGNOSIS — R1310 Dysphagia, unspecified: Secondary | ICD-10-CM | POA: Diagnosis not present

## 2015-03-11 DIAGNOSIS — L89620 Pressure ulcer of left heel, unstageable: Secondary | ICD-10-CM | POA: Diagnosis not present

## 2015-03-12 DIAGNOSIS — G309 Alzheimer's disease, unspecified: Secondary | ICD-10-CM | POA: Diagnosis not present

## 2015-03-12 DIAGNOSIS — R1310 Dysphagia, unspecified: Secondary | ICD-10-CM | POA: Diagnosis not present

## 2015-03-12 DIAGNOSIS — I69391 Dysphagia following cerebral infarction: Secondary | ICD-10-CM | POA: Diagnosis not present

## 2015-03-12 DIAGNOSIS — L89620 Pressure ulcer of left heel, unstageable: Secondary | ICD-10-CM | POA: Diagnosis not present

## 2015-03-12 DIAGNOSIS — L89610 Pressure ulcer of right heel, unstageable: Secondary | ICD-10-CM | POA: Diagnosis not present

## 2015-03-12 DIAGNOSIS — L89152 Pressure ulcer of sacral region, stage 2: Secondary | ICD-10-CM | POA: Diagnosis not present

## 2015-03-14 DIAGNOSIS — L89152 Pressure ulcer of sacral region, stage 2: Secondary | ICD-10-CM | POA: Diagnosis not present

## 2015-03-14 DIAGNOSIS — R1310 Dysphagia, unspecified: Secondary | ICD-10-CM | POA: Diagnosis not present

## 2015-03-14 DIAGNOSIS — G309 Alzheimer's disease, unspecified: Secondary | ICD-10-CM | POA: Diagnosis not present

## 2015-03-14 DIAGNOSIS — L89620 Pressure ulcer of left heel, unstageable: Secondary | ICD-10-CM | POA: Diagnosis not present

## 2015-03-14 DIAGNOSIS — L89610 Pressure ulcer of right heel, unstageable: Secondary | ICD-10-CM | POA: Diagnosis not present

## 2015-03-14 DIAGNOSIS — I69391 Dysphagia following cerebral infarction: Secondary | ICD-10-CM | POA: Diagnosis not present

## 2015-03-16 DIAGNOSIS — G309 Alzheimer's disease, unspecified: Secondary | ICD-10-CM | POA: Diagnosis not present

## 2015-03-16 DIAGNOSIS — R1310 Dysphagia, unspecified: Secondary | ICD-10-CM | POA: Diagnosis not present

## 2015-03-16 DIAGNOSIS — L89610 Pressure ulcer of right heel, unstageable: Secondary | ICD-10-CM | POA: Diagnosis not present

## 2015-03-16 DIAGNOSIS — L89152 Pressure ulcer of sacral region, stage 2: Secondary | ICD-10-CM | POA: Diagnosis not present

## 2015-03-16 DIAGNOSIS — L89620 Pressure ulcer of left heel, unstageable: Secondary | ICD-10-CM | POA: Diagnosis not present

## 2015-03-16 DIAGNOSIS — I69391 Dysphagia following cerebral infarction: Secondary | ICD-10-CM | POA: Diagnosis not present

## 2015-03-20 DIAGNOSIS — I69391 Dysphagia following cerebral infarction: Secondary | ICD-10-CM | POA: Diagnosis not present

## 2015-03-20 DIAGNOSIS — G309 Alzheimer's disease, unspecified: Secondary | ICD-10-CM | POA: Diagnosis not present

## 2015-03-20 DIAGNOSIS — L89620 Pressure ulcer of left heel, unstageable: Secondary | ICD-10-CM | POA: Diagnosis not present

## 2015-03-20 DIAGNOSIS — R1310 Dysphagia, unspecified: Secondary | ICD-10-CM | POA: Diagnosis not present

## 2015-03-20 DIAGNOSIS — L89152 Pressure ulcer of sacral region, stage 2: Secondary | ICD-10-CM | POA: Diagnosis not present

## 2015-03-20 DIAGNOSIS — L89610 Pressure ulcer of right heel, unstageable: Secondary | ICD-10-CM | POA: Diagnosis not present

## 2015-03-22 DIAGNOSIS — R05 Cough: Secondary | ICD-10-CM | POA: Diagnosis not present

## 2015-03-22 DIAGNOSIS — L89620 Pressure ulcer of left heel, unstageable: Secondary | ICD-10-CM | POA: Diagnosis not present

## 2015-03-22 DIAGNOSIS — G309 Alzheimer's disease, unspecified: Secondary | ICD-10-CM | POA: Diagnosis not present

## 2015-03-22 DIAGNOSIS — I69391 Dysphagia following cerebral infarction: Secondary | ICD-10-CM | POA: Diagnosis not present

## 2015-03-22 DIAGNOSIS — R1310 Dysphagia, unspecified: Secondary | ICD-10-CM | POA: Diagnosis not present

## 2015-03-22 DIAGNOSIS — L89152 Pressure ulcer of sacral region, stage 2: Secondary | ICD-10-CM | POA: Diagnosis not present

## 2015-03-22 DIAGNOSIS — L89610 Pressure ulcer of right heel, unstageable: Secondary | ICD-10-CM | POA: Diagnosis not present

## 2015-03-26 DIAGNOSIS — R1310 Dysphagia, unspecified: Secondary | ICD-10-CM | POA: Diagnosis not present

## 2015-03-26 DIAGNOSIS — I69391 Dysphagia following cerebral infarction: Secondary | ICD-10-CM | POA: Diagnosis not present

## 2015-03-26 DIAGNOSIS — L89620 Pressure ulcer of left heel, unstageable: Secondary | ICD-10-CM | POA: Diagnosis not present

## 2015-03-26 DIAGNOSIS — L89152 Pressure ulcer of sacral region, stage 2: Secondary | ICD-10-CM | POA: Diagnosis not present

## 2015-03-26 DIAGNOSIS — L89610 Pressure ulcer of right heel, unstageable: Secondary | ICD-10-CM | POA: Diagnosis not present

## 2015-03-26 DIAGNOSIS — G309 Alzheimer's disease, unspecified: Secondary | ICD-10-CM | POA: Diagnosis not present

## 2015-03-28 DIAGNOSIS — R1310 Dysphagia, unspecified: Secondary | ICD-10-CM | POA: Diagnosis not present

## 2015-03-28 DIAGNOSIS — I69391 Dysphagia following cerebral infarction: Secondary | ICD-10-CM | POA: Diagnosis not present

## 2015-03-28 DIAGNOSIS — L89152 Pressure ulcer of sacral region, stage 2: Secondary | ICD-10-CM | POA: Diagnosis not present

## 2015-03-28 DIAGNOSIS — G309 Alzheimer's disease, unspecified: Secondary | ICD-10-CM | POA: Diagnosis not present

## 2015-03-28 DIAGNOSIS — L89620 Pressure ulcer of left heel, unstageable: Secondary | ICD-10-CM | POA: Diagnosis not present

## 2015-03-28 DIAGNOSIS — L89610 Pressure ulcer of right heel, unstageable: Secondary | ICD-10-CM | POA: Diagnosis not present

## 2015-03-29 DIAGNOSIS — L89610 Pressure ulcer of right heel, unstageable: Secondary | ICD-10-CM | POA: Diagnosis not present

## 2015-03-29 DIAGNOSIS — L89620 Pressure ulcer of left heel, unstageable: Secondary | ICD-10-CM | POA: Diagnosis not present

## 2015-03-30 DIAGNOSIS — L89620 Pressure ulcer of left heel, unstageable: Secondary | ICD-10-CM | POA: Diagnosis not present

## 2015-03-30 DIAGNOSIS — L89152 Pressure ulcer of sacral region, stage 2: Secondary | ICD-10-CM | POA: Diagnosis not present

## 2015-03-30 DIAGNOSIS — L89610 Pressure ulcer of right heel, unstageable: Secondary | ICD-10-CM | POA: Diagnosis not present

## 2015-03-30 DIAGNOSIS — R1310 Dysphagia, unspecified: Secondary | ICD-10-CM | POA: Diagnosis not present

## 2015-03-30 DIAGNOSIS — G309 Alzheimer's disease, unspecified: Secondary | ICD-10-CM | POA: Diagnosis not present

## 2015-03-30 DIAGNOSIS — I69391 Dysphagia following cerebral infarction: Secondary | ICD-10-CM | POA: Diagnosis not present

## 2015-04-02 DIAGNOSIS — L89610 Pressure ulcer of right heel, unstageable: Secondary | ICD-10-CM | POA: Diagnosis not present

## 2015-04-02 DIAGNOSIS — G309 Alzheimer's disease, unspecified: Secondary | ICD-10-CM | POA: Diagnosis not present

## 2015-04-02 DIAGNOSIS — R1310 Dysphagia, unspecified: Secondary | ICD-10-CM | POA: Diagnosis not present

## 2015-04-02 DIAGNOSIS — I69391 Dysphagia following cerebral infarction: Secondary | ICD-10-CM | POA: Diagnosis not present

## 2015-04-02 DIAGNOSIS — L89152 Pressure ulcer of sacral region, stage 2: Secondary | ICD-10-CM | POA: Diagnosis not present

## 2015-04-02 DIAGNOSIS — L89620 Pressure ulcer of left heel, unstageable: Secondary | ICD-10-CM | POA: Diagnosis not present

## 2015-04-04 DIAGNOSIS — L89620 Pressure ulcer of left heel, unstageable: Secondary | ICD-10-CM | POA: Diagnosis not present

## 2015-04-04 DIAGNOSIS — G309 Alzheimer's disease, unspecified: Secondary | ICD-10-CM | POA: Diagnosis not present

## 2015-04-04 DIAGNOSIS — I69391 Dysphagia following cerebral infarction: Secondary | ICD-10-CM | POA: Diagnosis not present

## 2015-04-04 DIAGNOSIS — R1310 Dysphagia, unspecified: Secondary | ICD-10-CM | POA: Diagnosis not present

## 2015-04-04 DIAGNOSIS — L89610 Pressure ulcer of right heel, unstageable: Secondary | ICD-10-CM | POA: Diagnosis not present

## 2015-04-04 DIAGNOSIS — L89152 Pressure ulcer of sacral region, stage 2: Secondary | ICD-10-CM | POA: Diagnosis not present

## 2015-04-06 DIAGNOSIS — G309 Alzheimer's disease, unspecified: Secondary | ICD-10-CM | POA: Diagnosis not present

## 2015-04-06 DIAGNOSIS — L89152 Pressure ulcer of sacral region, stage 2: Secondary | ICD-10-CM | POA: Diagnosis not present

## 2015-04-06 DIAGNOSIS — L89620 Pressure ulcer of left heel, unstageable: Secondary | ICD-10-CM | POA: Diagnosis not present

## 2015-04-06 DIAGNOSIS — R1310 Dysphagia, unspecified: Secondary | ICD-10-CM | POA: Diagnosis not present

## 2015-04-06 DIAGNOSIS — L89610 Pressure ulcer of right heel, unstageable: Secondary | ICD-10-CM | POA: Diagnosis not present

## 2015-04-06 DIAGNOSIS — I69391 Dysphagia following cerebral infarction: Secondary | ICD-10-CM | POA: Diagnosis not present

## 2015-04-09 DIAGNOSIS — R1310 Dysphagia, unspecified: Secondary | ICD-10-CM | POA: Diagnosis not present

## 2015-04-09 DIAGNOSIS — L89152 Pressure ulcer of sacral region, stage 2: Secondary | ICD-10-CM | POA: Diagnosis not present

## 2015-04-09 DIAGNOSIS — L89610 Pressure ulcer of right heel, unstageable: Secondary | ICD-10-CM | POA: Diagnosis not present

## 2015-04-09 DIAGNOSIS — G309 Alzheimer's disease, unspecified: Secondary | ICD-10-CM | POA: Diagnosis not present

## 2015-04-09 DIAGNOSIS — I69391 Dysphagia following cerebral infarction: Secondary | ICD-10-CM | POA: Diagnosis not present

## 2015-04-09 DIAGNOSIS — L89620 Pressure ulcer of left heel, unstageable: Secondary | ICD-10-CM | POA: Diagnosis not present

## 2015-04-11 DIAGNOSIS — L89610 Pressure ulcer of right heel, unstageable: Secondary | ICD-10-CM | POA: Diagnosis not present

## 2015-04-11 DIAGNOSIS — R1310 Dysphagia, unspecified: Secondary | ICD-10-CM | POA: Diagnosis not present

## 2015-04-11 DIAGNOSIS — L89152 Pressure ulcer of sacral region, stage 2: Secondary | ICD-10-CM | POA: Diagnosis not present

## 2015-04-11 DIAGNOSIS — I69391 Dysphagia following cerebral infarction: Secondary | ICD-10-CM | POA: Diagnosis not present

## 2015-04-11 DIAGNOSIS — L89620 Pressure ulcer of left heel, unstageable: Secondary | ICD-10-CM | POA: Diagnosis not present

## 2015-04-11 DIAGNOSIS — G309 Alzheimer's disease, unspecified: Secondary | ICD-10-CM | POA: Diagnosis not present

## 2015-04-13 DIAGNOSIS — L89610 Pressure ulcer of right heel, unstageable: Secondary | ICD-10-CM | POA: Diagnosis not present

## 2015-04-13 DIAGNOSIS — R1310 Dysphagia, unspecified: Secondary | ICD-10-CM | POA: Diagnosis not present

## 2015-04-13 DIAGNOSIS — G309 Alzheimer's disease, unspecified: Secondary | ICD-10-CM | POA: Diagnosis not present

## 2015-04-13 DIAGNOSIS — L89152 Pressure ulcer of sacral region, stage 2: Secondary | ICD-10-CM | POA: Diagnosis not present

## 2015-04-13 DIAGNOSIS — I69391 Dysphagia following cerebral infarction: Secondary | ICD-10-CM | POA: Diagnosis not present

## 2015-04-13 DIAGNOSIS — L89620 Pressure ulcer of left heel, unstageable: Secondary | ICD-10-CM | POA: Diagnosis not present

## 2015-04-16 DIAGNOSIS — L89152 Pressure ulcer of sacral region, stage 2: Secondary | ICD-10-CM | POA: Diagnosis not present

## 2015-04-16 DIAGNOSIS — L89610 Pressure ulcer of right heel, unstageable: Secondary | ICD-10-CM | POA: Diagnosis not present

## 2015-04-16 DIAGNOSIS — G309 Alzheimer's disease, unspecified: Secondary | ICD-10-CM | POA: Diagnosis not present

## 2015-04-16 DIAGNOSIS — R1310 Dysphagia, unspecified: Secondary | ICD-10-CM | POA: Diagnosis not present

## 2015-04-16 DIAGNOSIS — I69391 Dysphagia following cerebral infarction: Secondary | ICD-10-CM | POA: Diagnosis not present

## 2015-04-16 DIAGNOSIS — L89620 Pressure ulcer of left heel, unstageable: Secondary | ICD-10-CM | POA: Diagnosis not present

## 2015-04-18 DIAGNOSIS — I69391 Dysphagia following cerebral infarction: Secondary | ICD-10-CM | POA: Diagnosis not present

## 2015-04-18 DIAGNOSIS — L89620 Pressure ulcer of left heel, unstageable: Secondary | ICD-10-CM | POA: Diagnosis not present

## 2015-04-18 DIAGNOSIS — G309 Alzheimer's disease, unspecified: Secondary | ICD-10-CM | POA: Diagnosis not present

## 2015-04-18 DIAGNOSIS — R1310 Dysphagia, unspecified: Secondary | ICD-10-CM | POA: Diagnosis not present

## 2015-04-18 DIAGNOSIS — L89152 Pressure ulcer of sacral region, stage 2: Secondary | ICD-10-CM | POA: Diagnosis not present

## 2015-04-18 DIAGNOSIS — L89610 Pressure ulcer of right heel, unstageable: Secondary | ICD-10-CM | POA: Diagnosis not present

## 2015-04-19 ENCOUNTER — Encounter (HOSPITAL_BASED_OUTPATIENT_CLINIC_OR_DEPARTMENT_OTHER): Payer: Medicare Other | Attending: Internal Medicine

## 2015-04-19 DIAGNOSIS — E11621 Type 2 diabetes mellitus with foot ulcer: Secondary | ICD-10-CM | POA: Insufficient documentation

## 2015-04-19 DIAGNOSIS — Z8673 Personal history of transient ischemic attack (TIA), and cerebral infarction without residual deficits: Secondary | ICD-10-CM | POA: Insufficient documentation

## 2015-04-19 DIAGNOSIS — I4891 Unspecified atrial fibrillation: Secondary | ICD-10-CM | POA: Diagnosis not present

## 2015-04-19 DIAGNOSIS — L89610 Pressure ulcer of right heel, unstageable: Secondary | ICD-10-CM | POA: Diagnosis not present

## 2015-04-19 DIAGNOSIS — F0390 Unspecified dementia without behavioral disturbance: Secondary | ICD-10-CM | POA: Diagnosis not present

## 2015-04-19 DIAGNOSIS — E1140 Type 2 diabetes mellitus with diabetic neuropathy, unspecified: Secondary | ICD-10-CM | POA: Insufficient documentation

## 2015-04-19 DIAGNOSIS — L89620 Pressure ulcer of left heel, unstageable: Secondary | ICD-10-CM | POA: Diagnosis not present

## 2015-04-19 DIAGNOSIS — L89613 Pressure ulcer of right heel, stage 3: Secondary | ICD-10-CM | POA: Diagnosis not present

## 2015-04-19 DIAGNOSIS — I10 Essential (primary) hypertension: Secondary | ICD-10-CM | POA: Insufficient documentation

## 2015-04-19 DIAGNOSIS — L97521 Non-pressure chronic ulcer of other part of left foot limited to breakdown of skin: Secondary | ICD-10-CM | POA: Insufficient documentation

## 2015-04-19 DIAGNOSIS — L89323 Pressure ulcer of left buttock, stage 3: Secondary | ICD-10-CM | POA: Insufficient documentation

## 2015-04-19 DIAGNOSIS — L89313 Pressure ulcer of right buttock, stage 3: Secondary | ICD-10-CM | POA: Diagnosis not present

## 2015-04-20 DIAGNOSIS — L89152 Pressure ulcer of sacral region, stage 2: Secondary | ICD-10-CM | POA: Diagnosis not present

## 2015-04-20 DIAGNOSIS — I69391 Dysphagia following cerebral infarction: Secondary | ICD-10-CM | POA: Diagnosis not present

## 2015-04-20 DIAGNOSIS — L89610 Pressure ulcer of right heel, unstageable: Secondary | ICD-10-CM | POA: Diagnosis not present

## 2015-04-20 DIAGNOSIS — L89620 Pressure ulcer of left heel, unstageable: Secondary | ICD-10-CM | POA: Diagnosis not present

## 2015-04-20 DIAGNOSIS — G309 Alzheimer's disease, unspecified: Secondary | ICD-10-CM | POA: Diagnosis not present

## 2015-04-20 DIAGNOSIS — R1310 Dysphagia, unspecified: Secondary | ICD-10-CM | POA: Diagnosis not present

## 2015-04-23 DIAGNOSIS — L89610 Pressure ulcer of right heel, unstageable: Secondary | ICD-10-CM | POA: Diagnosis not present

## 2015-04-23 DIAGNOSIS — L89620 Pressure ulcer of left heel, unstageable: Secondary | ICD-10-CM | POA: Diagnosis not present

## 2015-04-23 DIAGNOSIS — R1310 Dysphagia, unspecified: Secondary | ICD-10-CM | POA: Diagnosis not present

## 2015-04-23 DIAGNOSIS — G309 Alzheimer's disease, unspecified: Secondary | ICD-10-CM | POA: Diagnosis not present

## 2015-04-23 DIAGNOSIS — I69391 Dysphagia following cerebral infarction: Secondary | ICD-10-CM | POA: Diagnosis not present

## 2015-04-23 DIAGNOSIS — L89152 Pressure ulcer of sacral region, stage 2: Secondary | ICD-10-CM | POA: Diagnosis not present

## 2015-04-24 DIAGNOSIS — L89899 Pressure ulcer of other site, unspecified stage: Secondary | ICD-10-CM | POA: Diagnosis not present

## 2015-04-25 DIAGNOSIS — G309 Alzheimer's disease, unspecified: Secondary | ICD-10-CM | POA: Diagnosis not present

## 2015-04-25 DIAGNOSIS — L89610 Pressure ulcer of right heel, unstageable: Secondary | ICD-10-CM | POA: Diagnosis not present

## 2015-04-25 DIAGNOSIS — L89620 Pressure ulcer of left heel, unstageable: Secondary | ICD-10-CM | POA: Diagnosis not present

## 2015-04-25 DIAGNOSIS — L89152 Pressure ulcer of sacral region, stage 2: Secondary | ICD-10-CM | POA: Diagnosis not present

## 2015-04-25 DIAGNOSIS — I69391 Dysphagia following cerebral infarction: Secondary | ICD-10-CM | POA: Diagnosis not present

## 2015-04-25 DIAGNOSIS — R1310 Dysphagia, unspecified: Secondary | ICD-10-CM | POA: Diagnosis not present

## 2015-04-27 DIAGNOSIS — L89610 Pressure ulcer of right heel, unstageable: Secondary | ICD-10-CM | POA: Diagnosis not present

## 2015-04-27 DIAGNOSIS — G309 Alzheimer's disease, unspecified: Secondary | ICD-10-CM | POA: Diagnosis not present

## 2015-04-27 DIAGNOSIS — L89620 Pressure ulcer of left heel, unstageable: Secondary | ICD-10-CM | POA: Diagnosis not present

## 2015-04-27 DIAGNOSIS — R1310 Dysphagia, unspecified: Secondary | ICD-10-CM | POA: Diagnosis not present

## 2015-04-27 DIAGNOSIS — I69391 Dysphagia following cerebral infarction: Secondary | ICD-10-CM | POA: Diagnosis not present

## 2015-04-27 DIAGNOSIS — L89152 Pressure ulcer of sacral region, stage 2: Secondary | ICD-10-CM | POA: Diagnosis not present

## 2015-04-30 DIAGNOSIS — L89610 Pressure ulcer of right heel, unstageable: Secondary | ICD-10-CM | POA: Diagnosis not present

## 2015-04-30 DIAGNOSIS — R1310 Dysphagia, unspecified: Secondary | ICD-10-CM | POA: Diagnosis not present

## 2015-04-30 DIAGNOSIS — G309 Alzheimer's disease, unspecified: Secondary | ICD-10-CM | POA: Diagnosis not present

## 2015-04-30 DIAGNOSIS — L89152 Pressure ulcer of sacral region, stage 2: Secondary | ICD-10-CM | POA: Diagnosis not present

## 2015-04-30 DIAGNOSIS — I69391 Dysphagia following cerebral infarction: Secondary | ICD-10-CM | POA: Diagnosis not present

## 2015-04-30 DIAGNOSIS — L89620 Pressure ulcer of left heel, unstageable: Secondary | ICD-10-CM | POA: Diagnosis not present

## 2015-05-02 DIAGNOSIS — I69391 Dysphagia following cerebral infarction: Secondary | ICD-10-CM | POA: Diagnosis not present

## 2015-05-02 DIAGNOSIS — L89610 Pressure ulcer of right heel, unstageable: Secondary | ICD-10-CM | POA: Diagnosis not present

## 2015-05-02 DIAGNOSIS — L89620 Pressure ulcer of left heel, unstageable: Secondary | ICD-10-CM | POA: Diagnosis not present

## 2015-05-02 DIAGNOSIS — R1310 Dysphagia, unspecified: Secondary | ICD-10-CM | POA: Diagnosis not present

## 2015-05-02 DIAGNOSIS — L89152 Pressure ulcer of sacral region, stage 2: Secondary | ICD-10-CM | POA: Diagnosis not present

## 2015-05-02 DIAGNOSIS — G309 Alzheimer's disease, unspecified: Secondary | ICD-10-CM | POA: Diagnosis not present

## 2015-05-03 DIAGNOSIS — L89323 Pressure ulcer of left buttock, stage 3: Secondary | ICD-10-CM | POA: Diagnosis not present

## 2015-05-03 DIAGNOSIS — M25579 Pain in unspecified ankle and joints of unspecified foot: Secondary | ICD-10-CM | POA: Diagnosis not present

## 2015-05-03 DIAGNOSIS — L89613 Pressure ulcer of right heel, stage 3: Secondary | ICD-10-CM | POA: Diagnosis not present

## 2015-05-03 DIAGNOSIS — L89620 Pressure ulcer of left heel, unstageable: Secondary | ICD-10-CM | POA: Diagnosis not present

## 2015-05-04 DIAGNOSIS — L89610 Pressure ulcer of right heel, unstageable: Secondary | ICD-10-CM | POA: Diagnosis not present

## 2015-05-04 DIAGNOSIS — L89152 Pressure ulcer of sacral region, stage 2: Secondary | ICD-10-CM | POA: Diagnosis not present

## 2015-05-04 DIAGNOSIS — I69391 Dysphagia following cerebral infarction: Secondary | ICD-10-CM | POA: Diagnosis not present

## 2015-05-04 DIAGNOSIS — G309 Alzheimer's disease, unspecified: Secondary | ICD-10-CM | POA: Diagnosis not present

## 2015-05-04 DIAGNOSIS — R1310 Dysphagia, unspecified: Secondary | ICD-10-CM | POA: Diagnosis not present

## 2015-05-04 DIAGNOSIS — L89620 Pressure ulcer of left heel, unstageable: Secondary | ICD-10-CM | POA: Diagnosis not present

## 2015-05-07 DIAGNOSIS — I69391 Dysphagia following cerebral infarction: Secondary | ICD-10-CM | POA: Diagnosis not present

## 2015-05-07 DIAGNOSIS — L89610 Pressure ulcer of right heel, unstageable: Secondary | ICD-10-CM | POA: Diagnosis not present

## 2015-05-07 DIAGNOSIS — R1310 Dysphagia, unspecified: Secondary | ICD-10-CM | POA: Diagnosis not present

## 2015-05-07 DIAGNOSIS — L89152 Pressure ulcer of sacral region, stage 2: Secondary | ICD-10-CM | POA: Diagnosis not present

## 2015-05-07 DIAGNOSIS — G309 Alzheimer's disease, unspecified: Secondary | ICD-10-CM | POA: Diagnosis not present

## 2015-05-07 DIAGNOSIS — L89620 Pressure ulcer of left heel, unstageable: Secondary | ICD-10-CM | POA: Diagnosis not present

## 2015-05-09 DIAGNOSIS — I69391 Dysphagia following cerebral infarction: Secondary | ICD-10-CM | POA: Diagnosis not present

## 2015-05-09 DIAGNOSIS — R1310 Dysphagia, unspecified: Secondary | ICD-10-CM | POA: Diagnosis not present

## 2015-05-09 DIAGNOSIS — L89610 Pressure ulcer of right heel, unstageable: Secondary | ICD-10-CM | POA: Diagnosis not present

## 2015-05-09 DIAGNOSIS — G309 Alzheimer's disease, unspecified: Secondary | ICD-10-CM | POA: Diagnosis not present

## 2015-05-09 DIAGNOSIS — L89899 Pressure ulcer of other site, unspecified stage: Secondary | ICD-10-CM | POA: Diagnosis not present

## 2015-05-09 DIAGNOSIS — L89620 Pressure ulcer of left heel, unstageable: Secondary | ICD-10-CM | POA: Diagnosis not present

## 2015-05-09 DIAGNOSIS — L89152 Pressure ulcer of sacral region, stage 2: Secondary | ICD-10-CM | POA: Diagnosis not present

## 2015-05-10 DIAGNOSIS — S91201D Unspecified open wound of right great toe with damage to nail, subsequent encounter: Secondary | ICD-10-CM | POA: Diagnosis not present

## 2015-05-10 DIAGNOSIS — S91105D Unspecified open wound of left lesser toe(s) without damage to nail, subsequent encounter: Secondary | ICD-10-CM | POA: Diagnosis not present

## 2015-05-10 DIAGNOSIS — L8942 Pressure ulcer of contiguous site of back, buttock and hip, stage 2: Secondary | ICD-10-CM | POA: Diagnosis not present

## 2015-05-10 DIAGNOSIS — L89623 Pressure ulcer of left heel, stage 3: Secondary | ICD-10-CM | POA: Diagnosis not present

## 2015-05-10 DIAGNOSIS — L89811 Pressure ulcer of head, stage 1: Secondary | ICD-10-CM | POA: Diagnosis not present

## 2015-05-10 DIAGNOSIS — L89613 Pressure ulcer of right heel, stage 3: Secondary | ICD-10-CM | POA: Diagnosis not present

## 2015-05-11 DIAGNOSIS — L89623 Pressure ulcer of left heel, stage 3: Secondary | ICD-10-CM | POA: Diagnosis not present

## 2015-05-11 DIAGNOSIS — L8942 Pressure ulcer of contiguous site of back, buttock and hip, stage 2: Secondary | ICD-10-CM | POA: Diagnosis not present

## 2015-05-11 DIAGNOSIS — L89613 Pressure ulcer of right heel, stage 3: Secondary | ICD-10-CM | POA: Diagnosis not present

## 2015-05-11 DIAGNOSIS — L89811 Pressure ulcer of head, stage 1: Secondary | ICD-10-CM | POA: Diagnosis not present

## 2015-05-11 DIAGNOSIS — S91105D Unspecified open wound of left lesser toe(s) without damage to nail, subsequent encounter: Secondary | ICD-10-CM | POA: Diagnosis not present

## 2015-05-11 DIAGNOSIS — S91201D Unspecified open wound of right great toe with damage to nail, subsequent encounter: Secondary | ICD-10-CM | POA: Diagnosis not present

## 2015-05-14 ENCOUNTER — Encounter (HOSPITAL_BASED_OUTPATIENT_CLINIC_OR_DEPARTMENT_OTHER): Payer: Medicare Other | Attending: Internal Medicine

## 2015-05-14 DIAGNOSIS — L89153 Pressure ulcer of sacral region, stage 3: Secondary | ICD-10-CM | POA: Diagnosis not present

## 2015-05-14 DIAGNOSIS — F0390 Unspecified dementia without behavioral disturbance: Secondary | ICD-10-CM | POA: Insufficient documentation

## 2015-05-14 DIAGNOSIS — L89310 Pressure ulcer of right buttock, unstageable: Secondary | ICD-10-CM | POA: Diagnosis not present

## 2015-05-14 DIAGNOSIS — E1136 Type 2 diabetes mellitus with diabetic cataract: Secondary | ICD-10-CM | POA: Insufficient documentation

## 2015-05-14 DIAGNOSIS — L89313 Pressure ulcer of right buttock, stage 3: Secondary | ICD-10-CM | POA: Diagnosis not present

## 2015-05-14 DIAGNOSIS — E11621 Type 2 diabetes mellitus with foot ulcer: Secondary | ICD-10-CM | POA: Insufficient documentation

## 2015-05-14 DIAGNOSIS — L89613 Pressure ulcer of right heel, stage 3: Secondary | ICD-10-CM | POA: Insufficient documentation

## 2015-05-14 DIAGNOSIS — I10 Essential (primary) hypertension: Secondary | ICD-10-CM | POA: Insufficient documentation

## 2015-05-14 DIAGNOSIS — S0190XA Unspecified open wound of unspecified part of head, initial encounter: Secondary | ICD-10-CM | POA: Diagnosis not present

## 2015-05-14 DIAGNOSIS — X58XXXA Exposure to other specified factors, initial encounter: Secondary | ICD-10-CM | POA: Diagnosis not present

## 2015-05-14 DIAGNOSIS — L89323 Pressure ulcer of left buttock, stage 3: Secondary | ICD-10-CM | POA: Insufficient documentation

## 2015-05-14 DIAGNOSIS — E1140 Type 2 diabetes mellitus with diabetic neuropathy, unspecified: Secondary | ICD-10-CM | POA: Insufficient documentation

## 2015-05-14 DIAGNOSIS — L89623 Pressure ulcer of left heel, stage 3: Secondary | ICD-10-CM | POA: Insufficient documentation

## 2015-05-14 DIAGNOSIS — L97521 Non-pressure chronic ulcer of other part of left foot limited to breakdown of skin: Secondary | ICD-10-CM | POA: Diagnosis not present

## 2015-05-16 DIAGNOSIS — S91105D Unspecified open wound of left lesser toe(s) without damage to nail, subsequent encounter: Secondary | ICD-10-CM | POA: Diagnosis not present

## 2015-05-16 DIAGNOSIS — L89613 Pressure ulcer of right heel, stage 3: Secondary | ICD-10-CM | POA: Diagnosis not present

## 2015-05-16 DIAGNOSIS — L8942 Pressure ulcer of contiguous site of back, buttock and hip, stage 2: Secondary | ICD-10-CM | POA: Diagnosis not present

## 2015-05-16 DIAGNOSIS — L89811 Pressure ulcer of head, stage 1: Secondary | ICD-10-CM | POA: Diagnosis not present

## 2015-05-16 DIAGNOSIS — L89623 Pressure ulcer of left heel, stage 3: Secondary | ICD-10-CM | POA: Diagnosis not present

## 2015-05-16 DIAGNOSIS — S91201D Unspecified open wound of right great toe with damage to nail, subsequent encounter: Secondary | ICD-10-CM | POA: Diagnosis not present

## 2015-05-18 DIAGNOSIS — S91105D Unspecified open wound of left lesser toe(s) without damage to nail, subsequent encounter: Secondary | ICD-10-CM | POA: Diagnosis not present

## 2015-05-18 DIAGNOSIS — L89623 Pressure ulcer of left heel, stage 3: Secondary | ICD-10-CM | POA: Diagnosis not present

## 2015-05-18 DIAGNOSIS — L89613 Pressure ulcer of right heel, stage 3: Secondary | ICD-10-CM | POA: Diagnosis not present

## 2015-05-18 DIAGNOSIS — S91201D Unspecified open wound of right great toe with damage to nail, subsequent encounter: Secondary | ICD-10-CM | POA: Diagnosis not present

## 2015-05-18 DIAGNOSIS — L89811 Pressure ulcer of head, stage 1: Secondary | ICD-10-CM | POA: Diagnosis not present

## 2015-05-18 DIAGNOSIS — L8942 Pressure ulcer of contiguous site of back, buttock and hip, stage 2: Secondary | ICD-10-CM | POA: Diagnosis not present

## 2015-05-21 DIAGNOSIS — E1159 Type 2 diabetes mellitus with other circulatory complications: Secondary | ICD-10-CM | POA: Diagnosis not present

## 2015-05-21 DIAGNOSIS — F319 Bipolar disorder, unspecified: Secondary | ICD-10-CM | POA: Diagnosis not present

## 2015-05-21 DIAGNOSIS — I429 Cardiomyopathy, unspecified: Secondary | ICD-10-CM | POA: Diagnosis not present

## 2015-05-21 DIAGNOSIS — I359 Nonrheumatic aortic valve disorder, unspecified: Secondary | ICD-10-CM | POA: Diagnosis not present

## 2015-05-21 DIAGNOSIS — J309 Allergic rhinitis, unspecified: Secondary | ICD-10-CM | POA: Diagnosis not present

## 2015-05-21 DIAGNOSIS — R634 Abnormal weight loss: Secondary | ICD-10-CM | POA: Diagnosis not present

## 2015-05-21 DIAGNOSIS — R1310 Dysphagia, unspecified: Secondary | ICD-10-CM | POA: Diagnosis not present

## 2015-05-21 DIAGNOSIS — L8990 Pressure ulcer of unspecified site, unspecified stage: Secondary | ICD-10-CM | POA: Diagnosis not present

## 2015-05-21 DIAGNOSIS — G3183 Dementia with Lewy bodies: Secondary | ICD-10-CM | POA: Diagnosis not present

## 2015-05-21 DIAGNOSIS — I10 Essential (primary) hypertension: Secondary | ICD-10-CM | POA: Diagnosis not present

## 2015-05-21 DIAGNOSIS — E785 Hyperlipidemia, unspecified: Secondary | ICD-10-CM | POA: Diagnosis not present

## 2015-05-21 DIAGNOSIS — G9009 Other idiopathic peripheral autonomic neuropathy: Secondary | ICD-10-CM | POA: Diagnosis not present

## 2015-05-21 DIAGNOSIS — N189 Chronic kidney disease, unspecified: Secondary | ICD-10-CM | POA: Diagnosis not present

## 2015-05-21 DIAGNOSIS — M2450 Contracture, unspecified joint: Secondary | ICD-10-CM | POA: Diagnosis not present

## 2015-05-21 DIAGNOSIS — R630 Anorexia: Secondary | ICD-10-CM | POA: Diagnosis not present

## 2015-05-21 DIAGNOSIS — I4891 Unspecified atrial fibrillation: Secondary | ICD-10-CM | POA: Diagnosis not present

## 2015-05-21 DIAGNOSIS — K219 Gastro-esophageal reflux disease without esophagitis: Secondary | ICD-10-CM | POA: Diagnosis not present

## 2015-05-21 DIAGNOSIS — I679 Cerebrovascular disease, unspecified: Secondary | ICD-10-CM | POA: Diagnosis not present

## 2015-05-22 DIAGNOSIS — B351 Tinea unguium: Secondary | ICD-10-CM | POA: Diagnosis not present

## 2015-05-22 DIAGNOSIS — L601 Onycholysis: Secondary | ICD-10-CM | POA: Diagnosis not present

## 2015-05-22 DIAGNOSIS — E119 Type 2 diabetes mellitus without complications: Secondary | ICD-10-CM | POA: Diagnosis not present

## 2015-05-22 DIAGNOSIS — L97509 Non-pressure chronic ulcer of other part of unspecified foot with unspecified severity: Secondary | ICD-10-CM | POA: Diagnosis not present

## 2015-05-23 DIAGNOSIS — G3183 Dementia with Lewy bodies: Secondary | ICD-10-CM | POA: Diagnosis not present

## 2015-05-23 DIAGNOSIS — I4891 Unspecified atrial fibrillation: Secondary | ICD-10-CM | POA: Diagnosis not present

## 2015-05-23 DIAGNOSIS — I359 Nonrheumatic aortic valve disorder, unspecified: Secondary | ICD-10-CM | POA: Diagnosis not present

## 2015-05-23 DIAGNOSIS — N189 Chronic kidney disease, unspecified: Secondary | ICD-10-CM | POA: Diagnosis not present

## 2015-05-23 DIAGNOSIS — I429 Cardiomyopathy, unspecified: Secondary | ICD-10-CM | POA: Diagnosis not present

## 2015-05-23 DIAGNOSIS — I679 Cerebrovascular disease, unspecified: Secondary | ICD-10-CM | POA: Diagnosis not present

## 2015-05-24 DIAGNOSIS — G3183 Dementia with Lewy bodies: Secondary | ICD-10-CM | POA: Diagnosis not present

## 2015-05-24 DIAGNOSIS — I429 Cardiomyopathy, unspecified: Secondary | ICD-10-CM | POA: Diagnosis not present

## 2015-05-24 DIAGNOSIS — I359 Nonrheumatic aortic valve disorder, unspecified: Secondary | ICD-10-CM | POA: Diagnosis not present

## 2015-05-24 DIAGNOSIS — I4891 Unspecified atrial fibrillation: Secondary | ICD-10-CM | POA: Diagnosis not present

## 2015-05-24 DIAGNOSIS — I679 Cerebrovascular disease, unspecified: Secondary | ICD-10-CM | POA: Diagnosis not present

## 2015-05-24 DIAGNOSIS — N189 Chronic kidney disease, unspecified: Secondary | ICD-10-CM | POA: Diagnosis not present

## 2015-05-25 DIAGNOSIS — I359 Nonrheumatic aortic valve disorder, unspecified: Secondary | ICD-10-CM | POA: Diagnosis not present

## 2015-05-25 DIAGNOSIS — N189 Chronic kidney disease, unspecified: Secondary | ICD-10-CM | POA: Diagnosis not present

## 2015-05-25 DIAGNOSIS — I429 Cardiomyopathy, unspecified: Secondary | ICD-10-CM | POA: Diagnosis not present

## 2015-05-25 DIAGNOSIS — G3183 Dementia with Lewy bodies: Secondary | ICD-10-CM | POA: Diagnosis not present

## 2015-05-25 DIAGNOSIS — I679 Cerebrovascular disease, unspecified: Secondary | ICD-10-CM | POA: Diagnosis not present

## 2015-05-25 DIAGNOSIS — I4891 Unspecified atrial fibrillation: Secondary | ICD-10-CM | POA: Diagnosis not present

## 2015-05-28 DIAGNOSIS — G3183 Dementia with Lewy bodies: Secondary | ICD-10-CM | POA: Diagnosis not present

## 2015-05-28 DIAGNOSIS — I4891 Unspecified atrial fibrillation: Secondary | ICD-10-CM | POA: Diagnosis not present

## 2015-05-28 DIAGNOSIS — I359 Nonrheumatic aortic valve disorder, unspecified: Secondary | ICD-10-CM | POA: Diagnosis not present

## 2015-05-28 DIAGNOSIS — I679 Cerebrovascular disease, unspecified: Secondary | ICD-10-CM | POA: Diagnosis not present

## 2015-05-28 DIAGNOSIS — I429 Cardiomyopathy, unspecified: Secondary | ICD-10-CM | POA: Diagnosis not present

## 2015-05-28 DIAGNOSIS — N189 Chronic kidney disease, unspecified: Secondary | ICD-10-CM | POA: Diagnosis not present

## 2015-05-29 DIAGNOSIS — I4891 Unspecified atrial fibrillation: Secondary | ICD-10-CM | POA: Diagnosis not present

## 2015-05-29 DIAGNOSIS — N189 Chronic kidney disease, unspecified: Secondary | ICD-10-CM | POA: Diagnosis not present

## 2015-05-29 DIAGNOSIS — G3183 Dementia with Lewy bodies: Secondary | ICD-10-CM | POA: Diagnosis not present

## 2015-05-29 DIAGNOSIS — I429 Cardiomyopathy, unspecified: Secondary | ICD-10-CM | POA: Diagnosis not present

## 2015-05-29 DIAGNOSIS — I359 Nonrheumatic aortic valve disorder, unspecified: Secondary | ICD-10-CM | POA: Diagnosis not present

## 2015-05-29 DIAGNOSIS — I679 Cerebrovascular disease, unspecified: Secondary | ICD-10-CM | POA: Diagnosis not present

## 2015-05-30 DIAGNOSIS — I359 Nonrheumatic aortic valve disorder, unspecified: Secondary | ICD-10-CM | POA: Diagnosis not present

## 2015-05-30 DIAGNOSIS — I4891 Unspecified atrial fibrillation: Secondary | ICD-10-CM | POA: Diagnosis not present

## 2015-05-30 DIAGNOSIS — G3183 Dementia with Lewy bodies: Secondary | ICD-10-CM | POA: Diagnosis not present

## 2015-05-30 DIAGNOSIS — I429 Cardiomyopathy, unspecified: Secondary | ICD-10-CM | POA: Diagnosis not present

## 2015-05-30 DIAGNOSIS — N189 Chronic kidney disease, unspecified: Secondary | ICD-10-CM | POA: Diagnosis not present

## 2015-05-30 DIAGNOSIS — I679 Cerebrovascular disease, unspecified: Secondary | ICD-10-CM | POA: Diagnosis not present

## 2015-05-31 DIAGNOSIS — I429 Cardiomyopathy, unspecified: Secondary | ICD-10-CM | POA: Diagnosis not present

## 2015-05-31 DIAGNOSIS — I679 Cerebrovascular disease, unspecified: Secondary | ICD-10-CM | POA: Diagnosis not present

## 2015-05-31 DIAGNOSIS — G3183 Dementia with Lewy bodies: Secondary | ICD-10-CM | POA: Diagnosis not present

## 2015-05-31 DIAGNOSIS — I4891 Unspecified atrial fibrillation: Secondary | ICD-10-CM | POA: Diagnosis not present

## 2015-05-31 DIAGNOSIS — N189 Chronic kidney disease, unspecified: Secondary | ICD-10-CM | POA: Diagnosis not present

## 2015-05-31 DIAGNOSIS — I359 Nonrheumatic aortic valve disorder, unspecified: Secondary | ICD-10-CM | POA: Diagnosis not present

## 2015-06-01 DIAGNOSIS — I4891 Unspecified atrial fibrillation: Secondary | ICD-10-CM | POA: Diagnosis not present

## 2015-06-01 DIAGNOSIS — I429 Cardiomyopathy, unspecified: Secondary | ICD-10-CM | POA: Diagnosis not present

## 2015-06-01 DIAGNOSIS — I359 Nonrheumatic aortic valve disorder, unspecified: Secondary | ICD-10-CM | POA: Diagnosis not present

## 2015-06-01 DIAGNOSIS — G3183 Dementia with Lewy bodies: Secondary | ICD-10-CM | POA: Diagnosis not present

## 2015-06-01 DIAGNOSIS — I679 Cerebrovascular disease, unspecified: Secondary | ICD-10-CM | POA: Diagnosis not present

## 2015-06-01 DIAGNOSIS — N189 Chronic kidney disease, unspecified: Secondary | ICD-10-CM | POA: Diagnosis not present

## 2015-06-04 DIAGNOSIS — G3183 Dementia with Lewy bodies: Secondary | ICD-10-CM | POA: Diagnosis not present

## 2015-06-04 DIAGNOSIS — N189 Chronic kidney disease, unspecified: Secondary | ICD-10-CM | POA: Diagnosis not present

## 2015-06-04 DIAGNOSIS — I429 Cardiomyopathy, unspecified: Secondary | ICD-10-CM | POA: Diagnosis not present

## 2015-06-04 DIAGNOSIS — I4891 Unspecified atrial fibrillation: Secondary | ICD-10-CM | POA: Diagnosis not present

## 2015-06-04 DIAGNOSIS — I359 Nonrheumatic aortic valve disorder, unspecified: Secondary | ICD-10-CM | POA: Diagnosis not present

## 2015-06-04 DIAGNOSIS — I679 Cerebrovascular disease, unspecified: Secondary | ICD-10-CM | POA: Diagnosis not present

## 2015-06-05 DIAGNOSIS — I679 Cerebrovascular disease, unspecified: Secondary | ICD-10-CM | POA: Diagnosis not present

## 2015-06-05 DIAGNOSIS — I4891 Unspecified atrial fibrillation: Secondary | ICD-10-CM | POA: Diagnosis not present

## 2015-06-05 DIAGNOSIS — I359 Nonrheumatic aortic valve disorder, unspecified: Secondary | ICD-10-CM | POA: Diagnosis not present

## 2015-06-05 DIAGNOSIS — N189 Chronic kidney disease, unspecified: Secondary | ICD-10-CM | POA: Diagnosis not present

## 2015-06-05 DIAGNOSIS — G3183 Dementia with Lewy bodies: Secondary | ICD-10-CM | POA: Diagnosis not present

## 2015-06-05 DIAGNOSIS — I429 Cardiomyopathy, unspecified: Secondary | ICD-10-CM | POA: Diagnosis not present

## 2015-06-06 DIAGNOSIS — I359 Nonrheumatic aortic valve disorder, unspecified: Secondary | ICD-10-CM | POA: Diagnosis not present

## 2015-06-06 DIAGNOSIS — I429 Cardiomyopathy, unspecified: Secondary | ICD-10-CM | POA: Diagnosis not present

## 2015-06-06 DIAGNOSIS — I4891 Unspecified atrial fibrillation: Secondary | ICD-10-CM | POA: Diagnosis not present

## 2015-06-06 DIAGNOSIS — I679 Cerebrovascular disease, unspecified: Secondary | ICD-10-CM | POA: Diagnosis not present

## 2015-06-06 DIAGNOSIS — N189 Chronic kidney disease, unspecified: Secondary | ICD-10-CM | POA: Diagnosis not present

## 2015-06-06 DIAGNOSIS — G3183 Dementia with Lewy bodies: Secondary | ICD-10-CM | POA: Diagnosis not present

## 2015-06-08 ENCOUNTER — Encounter: Payer: Self-pay | Admitting: Internal Medicine

## 2015-06-08 ENCOUNTER — Non-Acute Institutional Stay (SKILLED_NURSING_FACILITY): Payer: Medicare Other | Admitting: Internal Medicine

## 2015-06-08 DIAGNOSIS — K219 Gastro-esophageal reflux disease without esophagitis: Secondary | ICD-10-CM | POA: Diagnosis not present

## 2015-06-08 DIAGNOSIS — I4891 Unspecified atrial fibrillation: Secondary | ICD-10-CM | POA: Diagnosis not present

## 2015-06-08 DIAGNOSIS — I421 Obstructive hypertrophic cardiomyopathy: Secondary | ICD-10-CM

## 2015-06-08 DIAGNOSIS — E1122 Type 2 diabetes mellitus with diabetic chronic kidney disease: Secondary | ICD-10-CM

## 2015-06-08 DIAGNOSIS — G3183 Dementia with Lewy bodies: Secondary | ICD-10-CM | POA: Diagnosis not present

## 2015-06-08 DIAGNOSIS — N183 Chronic kidney disease, stage 3 unspecified: Secondary | ICD-10-CM

## 2015-06-08 DIAGNOSIS — N189 Chronic kidney disease, unspecified: Secondary | ICD-10-CM | POA: Diagnosis not present

## 2015-06-08 DIAGNOSIS — F209 Schizophrenia, unspecified: Secondary | ICD-10-CM | POA: Diagnosis not present

## 2015-06-08 DIAGNOSIS — I10 Essential (primary) hypertension: Secondary | ICD-10-CM | POA: Diagnosis not present

## 2015-06-08 DIAGNOSIS — F332 Major depressive disorder, recurrent severe without psychotic features: Secondary | ICD-10-CM | POA: Diagnosis not present

## 2015-06-08 DIAGNOSIS — E785 Hyperlipidemia, unspecified: Secondary | ICD-10-CM

## 2015-06-08 DIAGNOSIS — F02818 Dementia in other diseases classified elsewhere, unspecified severity, with other behavioral disturbance: Secondary | ICD-10-CM

## 2015-06-08 DIAGNOSIS — I359 Nonrheumatic aortic valve disorder, unspecified: Secondary | ICD-10-CM | POA: Diagnosis not present

## 2015-06-08 DIAGNOSIS — I679 Cerebrovascular disease, unspecified: Secondary | ICD-10-CM | POA: Diagnosis not present

## 2015-06-08 DIAGNOSIS — G308 Other Alzheimer's disease: Secondary | ICD-10-CM | POA: Diagnosis not present

## 2015-06-08 DIAGNOSIS — I480 Paroxysmal atrial fibrillation: Secondary | ICD-10-CM | POA: Diagnosis not present

## 2015-06-08 DIAGNOSIS — L8992 Pressure ulcer of unspecified site, stage 2: Secondary | ICD-10-CM | POA: Diagnosis not present

## 2015-06-08 DIAGNOSIS — F0281 Dementia in other diseases classified elsewhere with behavioral disturbance: Secondary | ICD-10-CM

## 2015-06-08 DIAGNOSIS — G309 Alzheimer's disease, unspecified: Principal | ICD-10-CM

## 2015-06-08 DIAGNOSIS — I429 Cardiomyopathy, unspecified: Secondary | ICD-10-CM | POA: Diagnosis not present

## 2015-06-08 NOTE — Progress Notes (Deleted)
MRN: [id number] Name: Emma Mcguire  Sex: female Age: 82 y.o. DOB: 1928-02-15  [place] #:  Facility/Room: Level Of Care: SNF Provider: [name] D Emergency Contacts: Extended Emergency Contact Information Primary Emergency Contact: Mcfarren,James Address: [place], [place] [address] [place] Phone: [phone] Relation: Son Secondary Emergency Contact: Rudell Cobb [place] Phone: [phone] Relation: Other  Code Status:   Allergies: Cymbalta; Lipitor; Metformin and related; Simvastatin; and Verapamil  Chief Complaint  Patient presents with  . New Admit To SNF    HPI: Patient is 82 y.o. female who  Past Medical History  Diagnosis Date  . Hypertension   . Alzheimer disease   . Fibromyalgia   . Atrial fibrillation ([place])   . Colon polyp   . GERD (gastroesophageal reflux disease)   . Diabetes mellitus without complication ([place])   . Neuropathy ([place])   . HOCM (hypertrophic obstructive cardiomyopathy) ([place])   . Chronic kidney disease     Past Surgical History  Procedure Laterality Date  . Shoulder surgery Left     rotator cuff      Medication List       This list is accurate as of: 06/08/15 12:49 PM.  Always use your most recent med list.               amLODipine 5 MG tablet  Commonly known as:  NORVASC  Take 5 mg by mouth daily.     aspirin 81 MG chewable tablet  Chew 4 tablets (324 mg total) by mouth daily.     atorvastatin 10 MG tablet  Commonly known as:  LIPITOR  Take 1 tablet (10 mg total) by mouth daily at 6 PM.     divalproex 125 MG capsule  Commonly known as:  DEPAKOTE SPRINKLE  Take 2 capsules (250 mg total) by mouth every 12 (twelve) hours.     docusate sodium 100 MG capsule  Commonly known as:  COLACE  Take 1 capsule (100 mg total) by mouth daily.     escitalopram 5 MG tablet  Commonly known as:  LEXAPRO  Take 10 mg by mouth daily.     feeding supplement (ENSURE  ENLIVE) Liqd  Take 237 mLs by mouth 2 (two) times daily between meals.     fluticasone 50 MCG/ACT nasal spray  Commonly known as:  FLONASE  Place 2 sprays into both nostrils daily.     insulin aspart 100 UNIT/ML injection  Commonly known as:  novoLOG  0-9 Units, Subcutaneous, 3 times daily with meals CBG < 70: implement hypoglycemia protocol CBG 70 - 120: 0 units CBG 121 - 150: 1 unit CBG 151 - 200: 2 units CBG 201 - 250: 3 units CBG 251 - 300: 5 units CBG 301 - 350: 7 units CBG 351 - 400: 9 units CBG > 400: call MD     magnesium hydroxide 400 MG/5ML suspension  Commonly known as:  MILK OF MAGNESIA  Take 30 mLs by mouth daily as needed for mild constipation.     metoprolol tartrate 25 mg/10 mL Susp  Commonly known as:  LOPRESSOR  Take 30 mLs (75 mg total) by mouth 2 (two) times daily.     pantoprazole 40 MG tablet  Commonly known as:  PROTONIX  Take 1 tablet (40 mg total) by mouth daily.     SENNA S PO  Take by mouth at bedtime.  THERA Tabs  Take 1 tablet by mouth daily.     vitamin B-12 1000 MCG tablet  Commonly known as:  CYANOCOBALAMIN  Take 1,000 mcg by mouth daily.        No orders of the defined types were placed in this encounter.     There is no immunization history on file for this patient.  Social History  Substance Use Topics  . Smoking status: Never Smoker   . Smokeless tobacco: Not on file  . Alcohol Use: No    Family history is    Review of Systems  DATA OBTAINED: from patient, nurse, medical record, family member GENERAL:  no fevers, fatigue, appetite changes SKIN: No itching, rash or wounds EYES: No eye pain, redness, discharge EARS: No earache, tinnitus, change in hearing NOSE: No congestion, drainage or bleeding  MOUTH/THROAT: No mouth or tooth pain, No sore throat RESPIRATORY: No cough, wheezing, SOB CARDIAC: No chest pain, palpitations, lower extremity edema  GI: No abdominal pain, No N/V/D or constipation, No heartburn or reflux   GU: No dysuria, frequency or urgency, or incontinence  MUSCULOSKELETAL: No unrelieved bone/joint pain NEUROLOGIC: No headache, dizziness or focal weakness PSYCHIATRIC: No c/o anxiety or sadness   There were no vitals filed for this visit.  SpO2 Readings from Last 1 Encounters:  01/15/15 96%        Physical Exam  GENERAL APPEARANCE: Alert, conversant,  No acute distress.  SKIN: No diaphoresis rash HEAD: Normocephalic, atraumatic  EYES: Conjunctiva/lids clear. Pupils round, reactive. EOMs intact.  EARS: External exam WNL, canals clear. Hearing grossly normal.  NOSE: No deformity or discharge.  MOUTH/THROAT: Lips w/o lesions  RESPIRATORY: Breathing is even, unlabored. Lung sounds are clear   CARDIOVASCULAR: Heart RRR no murmurs, rubs or gallops. No peripheral edema.   GASTROINTESTINAL: Abdomen is soft, non-tender, not distended w/ normal bowel sounds. GENITOURINARY: Bladder non tender, not distended  MUSCULOSKELETAL: No abnormal joints or musculature NEUROLOGIC:  Cranial nerves 2-12 grossly intact. Moves all extremities  PSYCHIATRIC: Mood and affect appropriate to situation, no behavioral issues  Patient Active Problem List   Diagnosis Date Noted  . Corn or callus 02/06/2015  . Dizziness 02/06/2015  . Neuropathy ([place]) 02/06/2015  . Secondary parkinsonism ([place])   . Depression   . Anxiety state   . Essential hypertension   . HLD (hyperlipidemia)   . Nonorganic psychosis   . Atrial fibrillation ([place]) 01/12/2015  . Sepsis ([place]) 01/12/2015  . Ischemic stroke ([place]) 01/12/2015  . Acute encephalopathy   . Paroxysmal atrial fibrillation (HCC)   . Schizophrenia ([place])   . Atrial fibrillation with RVR ([place]) 01/11/2015  . SVT (supraventricular tachycardia) ([place]) 01/11/2015  . Alzheimer disease   . Diabetes mellitus without complication ([place])   . HOCM (hypertrophic obstructive cardiomyopathy) ([place])   . HTN (hypertension) 01/07/2015  . Edema 01/06/2015  . Blister of skin  without infection 01/05/2015  . Parkinsonian features 12/25/2014  . Weakness 12/18/2014  . Altered mental status 12/11/2014  . UTI (urinary tract infection) 12/02/2014  . GERD (gastroesophageal reflux disease) 12/02/2014  . Psychosis 12/02/2014  . Renal failure (ARF), acute on chronic (HCC) 11/24/2014  . MDD (major depressive disorder) ([place]) 11/24/2014  . Pressure ulcer 11/24/2014  . Elevated troponin 11/23/2014  . Difficulty hearing 11/07/2014  . Alzheimer's dementia with behavioral disturbance 11/03/2014  . Severe episode of recurrent major depressive disorder, without psychotic features ([place]) 11/02/2014  . Chronic kidney disease (CKD), stage III (moderate) 09/17/2014  . Rotator cuff arthropathy  07/21/2014  . Diabetic polyneuropathy ([place]) 05/21/2014  . Acid reflux 05/21/2014  . Aortic heart valve narrowing 02/24/2014  . Acquired deformities of toe 07/12/2013  . Hypertrophic obstructive cardiomyopathy ([place]) 07/04/2013  . Left ventricular outflow obstruction 05/31/2012  . Left ventricular hypertrophy 05/07/2011  . Sick sinus syndrome ([place]) 05/07/2011  . Cardiac conduction disorder 05/01/2011  . Diabetes mellitus ([place]) 05/01/2011  . Degeneration of intervertebral disc of lumbosacral region 01/27/2011  . Chronic kidney disease 12/16/2010  . Absence of bladder continence 12/16/2010  . Diverticulitis 06/03/2010  . Fibromyalgia 06/03/2010  . Arthritis, degenerative 06/03/2010  . Adenomatous colon polyp 08/03/2006    CBC    Component Value Date/Time   WBC 9.4 01/14/2015 0154   RBC 3.92 01/14/2015 0154   HGB 11.5* 01/14/2015 0154   HCT 35.6* 01/14/2015 0154   PLT 278 01/14/2015 0154   MCV 90.8 01/14/2015 0154   LYMPHSABS 1.4 01/14/2015 0154   MONOABS 0.7 01/14/2015 0154   EOSABS 0.1 01/14/2015 0154   BASOSABS 0.0 01/14/2015 0154    CMP     Component Value Date/Time   NA 138 01/15/2015 0518   K 4.0 01/15/2015 0518   CL 113* 01/15/2015 0518   CO2 18* 01/15/2015 0518    GLUCOSE 139* 01/15/2015 0518   BUN 7 01/15/2015 0518   CREATININE 0.69 01/15/2015 0518   CALCIUM 8.9 01/15/2015 0518   PROT 4.9* 01/14/2015 0154   ALBUMIN 2.0* 01/14/2015 0154   AST 25 01/14/2015 0154   ALT 31 01/14/2015 0154   ALKPHOS 93 01/14/2015 0154   BILITOT 1.1 01/14/2015 0154   GFRNONAA >60 01/15/2015 0518   GFRAA >60 01/15/2015 0518    Lab Results  Component Value Date   HGBA1C 6.1* 01/12/2015     Mr Brain Wo Contrast  01/12/2015  CLINICAL DATA:  Diaphoresis and tachycardia. History dementia, atrial fibrillation not on anticoagulant, hypertension, chronic kidney disease. EXAM: MRI HEAD WITHOUT CONTRAST TECHNIQUE: Multiplanar, multiecho pulse sequences of the brain and surrounding structures were obtained without intravenous contrast. COMPARISON:  CT head December 18, 2014 FINDINGS: 7 mm focus of reduced diffusion RIGHT posterior frontal lobe, with corresponding low ADC value. No susceptibility artifact to suggest hemorrhage. Ventricles and sulci are overall normal for patient's age. Confluent supratentorial and pontine white matter T2 hyperintensities. Tiny old bilateral inferior cerebellar infarcts. Patchy T2 hyperintense old old the infarcts in the thalamus in addition to perivascular spaces and lacunar infarcts in basal ganglia. No midline shift, mass effect or mass lesions. No abnormal extra-axial fluid collections. No extra-axial masses though, contrast enhanced sequences would be more sensitive. Normal major intracranial vascular flow voids seen at the skull base. Dolicoectatic intracranial vessels most compatible with chronic hypertension. Status post bilateral ocular lens implants. No abnormal sellar expansion. Visualized paranasal sinuses and mastoid air cells are well-aerated. No suspicious calvarial bone marrow signal. Craniocervical junction maintained. IMPRESSION: 7 mm acute infarct RIGHT posterior frontal lobe. Chronic changes including severe chronic small vessel ischemic  disease and old bilateral basal ganglia/ thalamus lacunar infarcts, tiny inferior cerebellar lacunar infarcts. Electronically Signed   By: Elon Alas M.D.   On: 01/12/2015 02:31   Dg Chest Portable 1 View  01/11/2015  CLINICAL DATA:  Pt became cold and clammy at nursing home today, pt was in a-fib upon arrival to ER EXAM: PORTABLE CHEST 1 VIEW COMPARISON:  12/18/2014 FINDINGS: Numerous leads and wires project over the chest. Patient rotated left. Normal heart size. Atherosclerosis in the transverse aorta. Left hemidiaphragm elevation. No  pleural effusion or pneumothorax. Left base scarring and volume loss. There is also patchy right base atelectasis. IMPRESSION: No acute cardiopulmonary disease. Left base scarring with volume loss. Electronically Signed   By: Abigail Miyamoto M.D.   On: 01/11/2015 21:32    Not all labs, radiology exams or other studies done during hospitalization come through on my EPIC note; however they are reviewed by me.    Assessment and Plan  No problem-specific assessment & plan notes found for this encounter.   Hennie Duos, MD

## 2015-06-08 NOTE — Progress Notes (Signed)
Patient ID: MELVIE HANDAL, female   DOB: 10-04-1927, 82 y.o.   MRN: [id number] MRN: [id number] Name: Emma Mcguire  Sex: female Age: 82 y.o. DOB: 1927-07-25  [place] #: Facility/Room: [facility] [address] Level Of Care: SNF Provider: Hennie Duos Emergency Contacts: Extended Emergency Contact Information Primary Emergency Contact: Morrissey,James Address: [address] [place], [place] [address] [place] Phone: [phone] Relation: Son Secondary Emergency Contact: Rudell Cobb [place] Phone: [phone] Relation: Other  Code Status:   Allergies: Cymbalta; Lipitor; Metformin and related; Simvastatin; and Verapamil  Chief Complaint  Patient presents with  . New Admit To SNF    HPI: Patient is 82 y.o. female [place] of dementia, atrial fibrillation not on anticoagulants, hypertension, diet-controlled diabetes, GERD, HOCM, psychosis, Afib, who is admitted to SNF on 06/08/2015 for Hospice care for lewy body dementia. While at SNF pt will continued to be followed for AF, tx with metoprolol and ASA, cardiomyopathy with metoprolol and HTN with norvasc and metoprolol.  Past Medical History  Diagnosis Date  . Hypertension   . Alzheimer disease   . Fibromyalgia   . Atrial fibrillation ([place])   . Colon polyp   . GERD (gastroesophageal reflux disease)   . Diabetes mellitus without complication ([place])   . Neuropathy ([place])   . HOCM (hypertrophic obstructive cardiomyopathy) ([place])   . Chronic kidney disease     Past Surgical History  Procedure Laterality Date  . Shoulder surgery Left     rotator cuff      Medication List       This list is accurate as of: 06/08/15 11:59 PM.  Always use your most recent med list.               amLODipine 5 MG tablet  Commonly known as:  NORVASC  Take 5 mg by mouth daily.     aspirin 81 MG chewable tablet  Chew 4 tablets (324 mg total) by mouth daily.     atorvastatin 10 MG tablet   Commonly known as:  LIPITOR  Take 1 tablet (10 mg total) by mouth daily at 6 PM.     divalproex 125 MG capsule  Commonly known as:  DEPAKOTE SPRINKLE  Take 2 capsules (250 mg total) by mouth every 12 (twelve) hours.     docusate sodium 100 MG capsule  Commonly known as:  COLACE  Take 1 capsule (100 mg total) by mouth daily.     escitalopram 5 MG tablet  Commonly known as:  LEXAPRO  Take 10 mg by mouth daily.     fluticasone 50 MCG/ACT nasal spray  Commonly known as:  FLONASE  Place 2 sprays into both nostrils daily.     HYDROcodone-acetaminophen 5-325 MG tablet  Commonly known as:  NORCO/VICODIN  Take 1 tablet by mouth every 4 (four) hours as needed for moderate pain.     HYDROcodone-acetaminophen 5-325 MG tablet  Commonly known as:  NORCO/VICODIN  Take by mouth. Give 1 tablet by mouth Mon, Wed, Fri at 9:30 am     insulin aspart 100 UNIT/ML injection  Commonly known as:  novoLOG  0-9 Units, Subcutaneous, 3 times daily with meals CBG < 70: implement hypoglycemia protocol CBG 70 - 120: 0 units CBG 121 - 150: 1 unit CBG 151 - 200: 2 units CBG 201 - 250: 3 units CBG 251 - 300: 5 units CBG 301 - 350: 7  units CBG 351 - 400: 9 units CBG > 400: call MD     loratadine 10 MG tablet  Commonly known as:  CLARITIN  Take 10 mg by mouth daily as needed for allergies.     magnesium hydroxide 400 MG/5ML suspension  Commonly known as:  MILK OF MAGNESIA  Take 30 mLs by mouth daily as needed for mild constipation.     metoprolol tartrate 25 mg/10 mL Susp  Commonly known as:  LOPRESSOR  Take 30 mLs (75 mg total) by mouth 2 (two) times daily.     omeprazole 40 MG capsule  Commonly known as:  PRILOSEC  Take 40 mg by mouth daily.     SENNA S PO  Take by mouth at bedtime.     THERA Tabs  Take 1 tablet by mouth daily.     vitamin B-12 1000 MCG tablet  Commonly known as:  CYANOCOBALAMIN  Take 1,000 mcg by mouth daily.        Meds ordered this encounter  Medications  .  escitalopram (LEXAPRO) 5 MG tablet    Sig: Take 10 mg by mouth daily.  Marland Kitchen HYDROcodone-acetaminophen (NORCO/VICODIN) 5-325 MG tablet    Sig: Take 1 tablet by mouth every 4 (four) hours as needed for moderate pain.  Marland Kitchen HYDROcodone-acetaminophen (NORCO/VICODIN) 5-325 MG tablet    Sig: Take by mouth. Give 1 tablet by mouth Mon, Wed, Fri at 9:30 am  . omeprazole (PRILOSEC) 40 MG capsule    Sig: Take 40 mg by mouth daily.  Marland Kitchen loratadine (CLARITIN) 10 MG tablet    Sig: Take 10 mg by mouth daily as needed for allergies.     There is no immunization history on file for this patient.  Social History  Substance Use Topics  . Smoking status: Never Smoker   . Smokeless tobacco: Not on file  . Alcohol Use: No    Family history is +stroke, DM2   Review of Systems  DATA OBTAINED: from nurse GENERAL:  no fevers, fatigue, appetite changes SKIN: No itching, rash or wounds EYES: No eye pain, redness, discharge EARS: No earache, tinnitus, change in hearing NOSE: No congestion, drainage or bleeding  MOUTH/THROAT: No mouth or tooth pain, No sore throat RESPIRATORY: No cough, wheezing, SOB CARDIAC: No chest pain, palpitations, lower extremity edema  GI: No abdominal pain, No N/V/D or constipation, No heartburn or reflux  GU: No dysuria, frequency or urgency, or incontinence  MUSCULOSKELETAL: No unrelieved bone/joint pain NEUROLOGIC: No headache, dizziness or focal weakness PSYCHIATRIC: No c/o anxiety or sadness   Filed Vitals:   06/08/15 1347  BP: 146/80  Pulse: 68  Temp: 97.2 F (36.2 C)  Resp: 16    SpO2 Readings from Last 1 Encounters:  01/15/15 96%        Physical Exam  GENERAL APPEARANCE: Alert, min conversant,  No acute distress, very very thin SKIN: No diaphoresis rash HEAD: Normocephalic, atraumatic  EYES: Conjunctiva/lids clear. Pupils round, reactive. EOMs intact.  EARS: External exam WNL, canals clear. Hearing grossly normal.  NOSE: No deformity or discharge.   MOUTH/THROAT: Lips w/o lesions  RESPIRATORY: Breathing is even, unlabored. Lung sounds are dec and clear   CARDIOVASCULAR: Heart RRR no murmurs, rubs or gallops. No peripheral edema.   GASTROINTESTINAL: Abdomen is soft, non-tender, not distended w/ normal bowel sounds. GENITOURINARY: Bladder non tender, not distended  MUSCULOSKELETAL: No abnormal joints or musculature NEUROLOGIC:  Cranial nerves 2-12 grossly intact. Moves all extremities  PSYCHIATRIC: Mood and affect appropriate to  situation with dementia, no behavioral issues  Patient Active Problem List   Diagnosis Date Noted  . Corn or callus 02/06/2015  . Dizziness 02/06/2015  . Neuropathy ([place]) 02/06/2015  . Secondary parkinsonism ([place])   . Depression   . Anxiety state   . Essential hypertension   . HLD (hyperlipidemia)   . Nonorganic psychosis   . Atrial fibrillation ([place]) 01/12/2015  . Sepsis ([place]) 01/12/2015  . Ischemic stroke ([place]) 01/12/2015  . Acute encephalopathy   . Paroxysmal atrial fibrillation (HCC)   . Schizophrenia ([place])   . Atrial fibrillation with RVR ([place]) 01/11/2015  . SVT (supraventricular tachycardia) ([place]) 01/11/2015  . Alzheimer disease   . Diabetes mellitus without complication ([place])   . HOCM (hypertrophic obstructive cardiomyopathy) ([place])   . HTN (hypertension) 01/07/2015  . Edema 01/06/2015  . Blister of skin without infection 01/05/2015  . Parkinsonian features 12/25/2014  . Weakness 12/18/2014  . Altered mental status 12/11/2014  . UTI (urinary tract infection) 12/02/2014  . GERD (gastroesophageal reflux disease) 12/02/2014  . Psychosis 12/02/2014  . Renal failure (ARF), acute on chronic (HCC) 11/24/2014  . MDD (major depressive disorder) ([place]) 11/24/2014  . Pressure ulcer 11/24/2014  . Elevated troponin 11/23/2014  . Difficulty hearing 11/07/2014  . Alzheimer's dementia with behavioral disturbance 11/03/2014  . Severe episode of recurrent major depressive disorder, without psychotic  features ([place]) 11/02/2014  . Chronic kidney disease (CKD), stage III (moderate) 09/17/2014  . Rotator cuff arthropathy 07/21/2014  . Diabetic polyneuropathy ([place]) 05/21/2014  . Acid reflux 05/21/2014  . Aortic heart valve narrowing 02/24/2014  . Acquired deformities of toe 07/12/2013  . Hypertrophic obstructive cardiomyopathy ([place]) 07/04/2013  . Left ventricular outflow obstruction 05/31/2012  . Left ventricular hypertrophy 05/07/2011  . Sick sinus syndrome ([place]) 05/07/2011  . Cardiac conduction disorder 05/01/2011  . Diabetes mellitus ([place]) 05/01/2011  . Degeneration of intervertebral disc of lumbosacral region 01/27/2011  . Chronic kidney disease 12/16/2010  . Absence of bladder continence 12/16/2010  . Diverticulitis 06/03/2010  . Fibromyalgia 06/03/2010  . Arthritis, degenerative 06/03/2010  . Adenomatous colon polyp 08/03/2006    CBC    Component Value Date/Time   WBC 9.4 01/14/2015 0154   RBC 3.92 01/14/2015 0154   HGB 11.5* 01/14/2015 0154   HCT 35.6* 01/14/2015 0154   PLT 278 01/14/2015 0154   MCV 90.8 01/14/2015 0154   LYMPHSABS 1.4 01/14/2015 0154   MONOABS 0.7 01/14/2015 0154   EOSABS 0.1 01/14/2015 0154   BASOSABS 0.0 01/14/2015 0154    CMP     Component Value Date/Time   NA 138 01/15/2015 0518   K 4.0 01/15/2015 0518   CL 113* 01/15/2015 0518   CO2 18* 01/15/2015 0518   GLUCOSE 139* 01/15/2015 0518   BUN 7 01/15/2015 0518   CREATININE 0.69 01/15/2015 0518   CALCIUM 8.9 01/15/2015 0518   PROT 4.9* 01/14/2015 0154   ALBUMIN 2.0* 01/14/2015 0154   AST 25 01/14/2015 0154   ALT 31 01/14/2015 0154   ALKPHOS 93 01/14/2015 0154   BILITOT 1.1 01/14/2015 0154   GFRNONAA >60 01/15/2015 0518   GFRAA >60 01/15/2015 0518    Lab Results  Component Value Date   HGBA1C 6.1* 01/12/2015     Mr Brain Wo Contrast  01/12/2015  CLINICAL DATA:  Diaphoresis and tachycardia. History dementia, atrial fibrillation not on anticoagulant, hypertension, chronic kidney  disease. EXAM: MRI HEAD WITHOUT CONTRAST TECHNIQUE: Multiplanar, multiecho pulse sequences of the brain and surrounding structures were obtained without intravenous contrast.  COMPARISON:  CT head December 18, 2014 FINDINGS: 7 mm focus of reduced diffusion RIGHT posterior frontal lobe, with corresponding low ADC value. No susceptibility artifact to suggest hemorrhage. Ventricles and sulci are overall normal for patient's age. Confluent supratentorial and pontine white matter T2 hyperintensities. Tiny old bilateral inferior cerebellar infarcts. Patchy T2 hyperintense old old the infarcts in the thalamus in addition to perivascular spaces and lacunar infarcts in basal ganglia. No midline shift, mass effect or mass lesions. No abnormal extra-axial fluid collections. No extra-axial masses though, contrast enhanced sequences would be more sensitive. Normal major intracranial vascular flow voids seen at the skull base. Dolicoectatic intracranial vessels most compatible with chronic hypertension. Status post bilateral ocular lens implants. No abnormal sellar expansion. Visualized paranasal sinuses and mastoid air cells are well-aerated. No suspicious calvarial bone marrow signal. Craniocervical junction maintained. IMPRESSION: 7 mm acute infarct RIGHT posterior frontal lobe. Chronic changes including severe chronic small vessel ischemic disease and old bilateral basal ganglia/ thalamus lacunar infarcts, tiny inferior cerebellar lacunar infarcts. Electronically Signed   By: Elon Alas M.D.   On: 01/12/2015 02:31   Dg Chest Portable 1 View  01/11/2015  CLINICAL DATA:  Pt became cold and clammy at nursing home today, pt was in a-fib upon arrival to ER EXAM: PORTABLE CHEST 1 VIEW COMPARISON:  12/18/2014 FINDINGS: Numerous leads and wires project over the chest. Patient rotated left. Normal heart size. Atherosclerosis in the transverse aorta. Left hemidiaphragm elevation. No pleural effusion or pneumothorax. Left base  scarring and volume loss. There is also patchy right base atelectasis. IMPRESSION: No acute cardiopulmonary disease. Left base scarring with volume loss. Electronically Signed   By: Abigail Miyamoto M.D.   On: 01/11/2015 21:32    Not all labs, radiology exams or other studies done during hospitalization come through on my EPIC note; however they are reviewed by me.    Assessment and Plan  Alzheimer's dementia with behavioral disturbance Mood appears stable ;cont depakote and lexapro to cont stability  Paroxysmal atrial fibrillation (HCC) Controlled with metoprolol with prophylaxis with 325 mg ASA  Hypertrophic obstructive cardiomyopathy (HCC) Stable on metoprolol  HTN (hypertension) Controlled on metoprolol and norvasc;cont current meds  GERD (gastroesophageal reflux disease) Cont prilosec 40 mg daily and monitor  Diabetes mellitus (HCC) Last A1c 6.1 ; very poor appetitie, no meds needed  Chronic kidney disease (CKD), stage III (moderate) Cont encourage fluids; if labs allowed will follow BMP-up to family  Severe episode of recurrent major depressive disorder, without psychotic features ([place]) Cont lexapro, low dose  Psychosis Controlled on depakote  HLD (hyperlipidemia) Lipitor can be stopped   Time spent > 45 min;> 50% of time with patient was spent reviewing records, labs, tests and studies, counseling and developing plan of care  [name], MD

## 2015-06-09 DIAGNOSIS — G3183 Dementia with Lewy bodies: Secondary | ICD-10-CM | POA: Diagnosis not present

## 2015-06-09 DIAGNOSIS — E1159 Type 2 diabetes mellitus with other circulatory complications: Secondary | ICD-10-CM | POA: Diagnosis not present

## 2015-06-09 DIAGNOSIS — I429 Cardiomyopathy, unspecified: Secondary | ICD-10-CM | POA: Diagnosis not present

## 2015-06-09 DIAGNOSIS — R1310 Dysphagia, unspecified: Secondary | ICD-10-CM | POA: Diagnosis not present

## 2015-06-09 DIAGNOSIS — G9009 Other idiopathic peripheral autonomic neuropathy: Secondary | ICD-10-CM | POA: Diagnosis not present

## 2015-06-09 DIAGNOSIS — R634 Abnormal weight loss: Secondary | ICD-10-CM | POA: Diagnosis not present

## 2015-06-09 DIAGNOSIS — M2450 Contracture, unspecified joint: Secondary | ICD-10-CM | POA: Diagnosis not present

## 2015-06-09 DIAGNOSIS — N189 Chronic kidney disease, unspecified: Secondary | ICD-10-CM | POA: Diagnosis not present

## 2015-06-09 DIAGNOSIS — F319 Bipolar disorder, unspecified: Secondary | ICD-10-CM | POA: Diagnosis not present

## 2015-06-09 DIAGNOSIS — I4891 Unspecified atrial fibrillation: Secondary | ICD-10-CM | POA: Diagnosis not present

## 2015-06-09 DIAGNOSIS — L8990 Pressure ulcer of unspecified site, unspecified stage: Secondary | ICD-10-CM | POA: Diagnosis not present

## 2015-06-09 DIAGNOSIS — J309 Allergic rhinitis, unspecified: Secondary | ICD-10-CM | POA: Diagnosis not present

## 2015-06-09 DIAGNOSIS — I10 Essential (primary) hypertension: Secondary | ICD-10-CM | POA: Diagnosis not present

## 2015-06-09 DIAGNOSIS — E785 Hyperlipidemia, unspecified: Secondary | ICD-10-CM | POA: Diagnosis not present

## 2015-06-09 DIAGNOSIS — R630 Anorexia: Secondary | ICD-10-CM | POA: Diagnosis not present

## 2015-06-09 DIAGNOSIS — I679 Cerebrovascular disease, unspecified: Secondary | ICD-10-CM | POA: Diagnosis not present

## 2015-06-09 DIAGNOSIS — K219 Gastro-esophageal reflux disease without esophagitis: Secondary | ICD-10-CM | POA: Diagnosis not present

## 2015-06-09 DIAGNOSIS — I359 Nonrheumatic aortic valve disorder, unspecified: Secondary | ICD-10-CM | POA: Diagnosis not present

## 2015-06-12 ENCOUNTER — Encounter: Payer: Self-pay | Admitting: Internal Medicine

## 2015-06-12 DIAGNOSIS — L89213 Pressure ulcer of right hip, stage 3: Secondary | ICD-10-CM | POA: Diagnosis not present

## 2015-06-12 DIAGNOSIS — L97522 Non-pressure chronic ulcer of other part of left foot with fat layer exposed: Secondary | ICD-10-CM | POA: Diagnosis not present

## 2015-06-12 DIAGNOSIS — L97512 Non-pressure chronic ulcer of other part of right foot with fat layer exposed: Secondary | ICD-10-CM | POA: Diagnosis not present

## 2015-06-12 DIAGNOSIS — L89613 Pressure ulcer of right heel, stage 3: Secondary | ICD-10-CM | POA: Diagnosis not present

## 2015-06-12 DIAGNOSIS — L89623 Pressure ulcer of left heel, stage 3: Secondary | ICD-10-CM | POA: Diagnosis not present

## 2015-06-12 NOTE — Assessment & Plan Note (Signed)
Controlled on metoprolol and norvasc;cont current meds

## 2015-06-12 NOTE — Assessment & Plan Note (Signed)
Stable on metoprolol. °

## 2015-06-12 NOTE — Assessment & Plan Note (Signed)
Cont encourage fluids; if labs allowed will follow BMP-up to family

## 2015-06-12 NOTE — Assessment & Plan Note (Signed)
Mood appears stable ;cont depakote and lexapro to cont stability

## 2015-06-12 NOTE — Assessment & Plan Note (Signed)
Cont lexapro, low dose

## 2015-06-12 NOTE — Assessment & Plan Note (Signed)
Controlled on depakote

## 2015-06-12 NOTE — Assessment & Plan Note (Signed)
Lipitor can be stopped

## 2015-06-12 NOTE — Assessment & Plan Note (Signed)
Last A1c 6.1 ; very poor appetitie, no meds needed

## 2015-06-12 NOTE — Assessment & Plan Note (Signed)
Cont prilosec 40 mg daily and monitor

## 2015-06-12 NOTE — Assessment & Plan Note (Signed)
Controlled with metoprolol with prophylaxis with 325 mg ASA

## 2015-06-18 DIAGNOSIS — L89623 Pressure ulcer of left heel, stage 3: Secondary | ICD-10-CM | POA: Diagnosis not present

## 2015-06-18 DIAGNOSIS — L97522 Non-pressure chronic ulcer of other part of left foot with fat layer exposed: Secondary | ICD-10-CM | POA: Diagnosis not present

## 2015-06-18 DIAGNOSIS — L97512 Non-pressure chronic ulcer of other part of right foot with fat layer exposed: Secondary | ICD-10-CM | POA: Diagnosis not present

## 2015-06-18 DIAGNOSIS — L89213 Pressure ulcer of right hip, stage 3: Secondary | ICD-10-CM | POA: Diagnosis not present

## 2015-06-18 DIAGNOSIS — L89613 Pressure ulcer of right heel, stage 3: Secondary | ICD-10-CM | POA: Diagnosis not present

## 2015-06-26 DIAGNOSIS — L97522 Non-pressure chronic ulcer of other part of left foot with fat layer exposed: Secondary | ICD-10-CM | POA: Diagnosis not present

## 2015-06-26 DIAGNOSIS — L97512 Non-pressure chronic ulcer of other part of right foot with fat layer exposed: Secondary | ICD-10-CM | POA: Diagnosis not present

## 2015-06-26 DIAGNOSIS — L89213 Pressure ulcer of right hip, stage 3: Secondary | ICD-10-CM | POA: Diagnosis not present

## 2015-06-26 DIAGNOSIS — L89613 Pressure ulcer of right heel, stage 3: Secondary | ICD-10-CM | POA: Diagnosis not present

## 2015-06-26 DIAGNOSIS — L89623 Pressure ulcer of left heel, stage 3: Secondary | ICD-10-CM | POA: Diagnosis not present

## 2015-07-03 DIAGNOSIS — L89213 Pressure ulcer of right hip, stage 3: Secondary | ICD-10-CM | POA: Diagnosis not present

## 2015-07-03 DIAGNOSIS — L89623 Pressure ulcer of left heel, stage 3: Secondary | ICD-10-CM | POA: Diagnosis not present

## 2015-07-03 DIAGNOSIS — L89613 Pressure ulcer of right heel, stage 3: Secondary | ICD-10-CM | POA: Diagnosis not present

## 2015-07-03 DIAGNOSIS — L97512 Non-pressure chronic ulcer of other part of right foot with fat layer exposed: Secondary | ICD-10-CM | POA: Diagnosis not present

## 2015-07-03 DIAGNOSIS — L97522 Non-pressure chronic ulcer of other part of left foot with fat layer exposed: Secondary | ICD-10-CM | POA: Diagnosis not present

## 2015-07-06 ENCOUNTER — Non-Acute Institutional Stay (SKILLED_NURSING_FACILITY): Payer: Medicare Other | Admitting: Internal Medicine

## 2015-07-06 DIAGNOSIS — F209 Schizophrenia, unspecified: Secondary | ICD-10-CM

## 2015-07-06 DIAGNOSIS — I480 Paroxysmal atrial fibrillation: Secondary | ICD-10-CM | POA: Diagnosis not present

## 2015-07-08 NOTE — Progress Notes (Signed)
MRN: [id number] Name: Emma Mcguire  Sex: female Age: 82 y.o. DOB: 1927/08/23  [place] [facility]: [name] Facility/Room:202 Level Of Care: SNF Provider: [name] D Emergency Contacts: Extended Emergency Contact Information Primary Emergency Contact: Lamountain,James Address: [address] [place], [place] [address] [place] Phone: [phone] Relation: Son Secondary Emergency Contact: Rudell Cobb [place] of [place] Phone: [phone] Relation: Other  Code Status:   Allergies: Cymbalta; Lipitor; Metformin and related; Simvastatin; and Verapamil  Chief Complaint  Patient presents with  . Medical Management of Chronic Issues    HPI: Patient is 82 y.o. female who was recently admitted for FTT who is being seen for routine issues of AF, psychosis, and schizophrenia.  Past Medical History  Diagnosis Date  . Hypertension   . Alzheimer disease   . Fibromyalgia   . Atrial fibrillation ([place])   . Colon polyp   . GERD (gastroesophageal reflux disease)   . Diabetes mellitus without complication ([place])   . Neuropathy ([place])   . HOCM (hypertrophic obstructive cardiomyopathy) ([place])   . Chronic kidney disease     Past Surgical History  Procedure Laterality Date  . Shoulder surgery Left     rotator cuff      Medication List       This list is accurate as of: 07/06/15 11:59 PM.  Always use your most recent med list.               amLODipine 5 MG tablet  Commonly known as:  NORVASC  Take 5 mg by mouth daily.     aspirin 81 MG chewable tablet  Chew 4 tablets (324 mg total) by mouth daily.     atorvastatin 10 MG tablet  Commonly known as:  LIPITOR  Take 1 tablet (10 mg total) by mouth daily at 6 PM.     divalproex 125 MG capsule  Commonly known as:  DEPAKOTE SPRINKLE  Take 2 capsules (250 mg total) by mouth every 12 (twelve) hours.     docusate sodium 100 MG capsule  Commonly known as:  COLACE  Take 1 capsule (100 mg total)  by mouth daily.     escitalopram 5 MG tablet  Commonly known as:  LEXAPRO  Take 10 mg by mouth daily.     fluticasone 50 MCG/ACT nasal spray  Commonly known as:  FLONASE  Place 2 sprays into both nostrils daily.     HYDROcodone-acetaminophen 5-325 MG tablet  Commonly known as:  NORCO/VICODIN  Take 1 tablet by mouth every 4 (four) hours as needed for moderate pain.     HYDROcodone-acetaminophen 5-325 MG tablet  Commonly known as:  NORCO/VICODIN  Take by mouth. Give 1 tablet by mouth Mon, Wed, Fri at 9:30 am     insulin aspart 100 UNIT/ML injection  Commonly known as:  novoLOG  0-9 Units, Subcutaneous, 3 times daily with meals CBG < 70: implement hypoglycemia protocol CBG 70 - 120: 0 units CBG 121 - 150: 1 unit CBG 151 - 200: 2 units CBG 201 - 250: 3 units CBG 251 - 300: 5 units CBG 301 - 350: 7 units CBG 351 - 400: 9 units CBG > 400: call MD     loratadine 10 MG tablet  Commonly known as:  CLARITIN  Take 10 mg by mouth daily as needed for allergies.     magnesium hydroxide 400 MG/5ML suspension  Commonly known as:  MILK OF  MAGNESIA  Take 30 mLs by mouth daily as needed for mild constipation.     metoprolol tartrate 25 mg/10 mL Susp  Commonly known as:  LOPRESSOR  Take 30 mLs (75 mg total) by mouth 2 (two) times daily.     omeprazole 40 MG capsule  Commonly known as:  PRILOSEC  Take 40 mg by mouth daily.     SENNA S PO  Take by mouth at bedtime.     THERA Tabs  Take 1 tablet by mouth daily.     vitamin B-12 1000 MCG tablet  Commonly known as:  CYANOCOBALAMIN  Take 1,000 mcg by mouth daily.        No orders of the defined types were placed in this encounter.     There is no immunization history on file for this patient.  Social History  Substance Use Topics  . Smoking status: Never Smoker   . Smokeless tobacco: Not on file  . Alcohol Use: No    Review of Systems  UTO 2/2 dementia    Filed Vitals:   07/13/15 1943  BP: 185/98  Pulse: 83  Temp: 98 F  (36.7 C)  Resp: 18    Physical Exam  GENERAL APPEARANCE: Alert, min conversant, No acute distress  SKIN: No diaphoresis rash; wounds ,dressed B heels and toe of each foot HEENT: Unremarkable RESPIRATORY: Breathing is even, unlabored. Lung sounds are clear   CARDIOVASCULAR: Heart RRR no murmurs, rubs or gallops. No peripheral edema  GASTROINTESTINAL: Abdomen is soft, non-tender, not distended w/ normal bowel sounds.  GENITOURINARY: Bladder non tender, not distended  MUSCULOSKELETAL: very thin NEUROLOGIC: Cranial nerves 2-12 grossly intact. Moves all extremities PSYCHIATRIC: Mood and affect appropriate to situation with dementia, no behavioral issues  Patient Active Problem List   Diagnosis Date Noted  . Corn or callus 02/06/2015  . Dizziness 02/06/2015  . Neuropathy ([place]) 02/06/2015  . Secondary parkinsonism ([place])   . Depression   . Anxiety state   . Essential hypertension   . HLD (hyperlipidemia)   . Nonorganic psychosis   . Atrial fibrillation ([place]) 01/12/2015  . Sepsis ([place]) 01/12/2015  . Ischemic stroke ([place]) 01/12/2015  . Acute encephalopathy   . Paroxysmal atrial fibrillation (HCC)   . Schizophrenia ([place])   . Atrial fibrillation with RVR ([place]) 01/11/2015  . SVT (supraventricular tachycardia) ([place]) 01/11/2015  . Alzheimer disease   . Diabetes mellitus without complication ([place])   . HOCM (hypertrophic obstructive cardiomyopathy) ([place])   . HTN (hypertension) 01/07/2015  . Edema 01/06/2015  . Blister of skin without infection 01/05/2015  . Parkinsonian features 12/25/2014  . Weakness 12/18/2014  . Altered mental status 12/11/2014  . UTI (urinary tract infection) 12/02/2014  . GERD (gastroesophageal reflux disease) 12/02/2014  . Psychosis 12/02/2014  . Renal failure (ARF), acute on chronic (HCC) 11/24/2014  . MDD (major depressive disorder) ([place]) 11/24/2014  . Pressure ulcer 11/24/2014  . Elevated troponin 11/23/2014  . Difficulty hearing 11/07/2014  .  Alzheimer's dementia with behavioral disturbance 11/03/2014  . Severe episode of recurrent major depressive disorder, without psychotic features ([place]) 11/02/2014  . Chronic kidney disease (CKD), stage III (moderate) 09/17/2014  . Rotator cuff arthropathy 07/21/2014  . Diabetic polyneuropathy ([place]) 05/21/2014  . Acid reflux 05/21/2014  . Aortic heart valve narrowing 02/24/2014  . Acquired deformities of toe 07/12/2013  . Hypertrophic obstructive cardiomyopathy ([place]) 07/04/2013  . Left ventricular outflow obstruction 05/31/2012  . Left ventricular hypertrophy 05/07/2011  . Sick sinus syndrome ([place]) 05/07/2011  .  Cardiac conduction disorder 05/01/2011  . Diabetes mellitus ([place]) 05/01/2011  . Degeneration of intervertebral disc of lumbosacral region 01/27/2011  . Chronic kidney disease 12/16/2010  . Absence of bladder continence 12/16/2010  . Diverticulitis 06/03/2010  . Fibromyalgia 06/03/2010  . Arthritis, degenerative 06/03/2010  . Adenomatous colon polyp 08/03/2006    CBC    Component Value Date/Time   WBC 9.4 01/14/2015 0154   RBC 3.92 01/14/2015 0154   HGB 11.5* 01/14/2015 0154   HCT 35.6* 01/14/2015 0154   PLT 278 01/14/2015 0154   MCV 90.8 01/14/2015 0154   LYMPHSABS 1.4 01/14/2015 0154   MONOABS 0.7 01/14/2015 0154   EOSABS 0.1 01/14/2015 0154   BASOSABS 0.0 01/14/2015 0154    CMP     Component Value Date/Time   NA 138 01/15/2015 0518   K 4.0 01/15/2015 0518   CL 113* 01/15/2015 0518   CO2 18* 01/15/2015 0518   GLUCOSE 139* 01/15/2015 0518   BUN 7 01/15/2015 0518   CREATININE 0.69 01/15/2015 0518   CALCIUM 8.9 01/15/2015 0518   PROT 4.9* 01/14/2015 0154   ALBUMIN 2.0* 01/14/2015 0154   AST 25 01/14/2015 0154   ALT 31 01/14/2015 0154   ALKPHOS 93 01/14/2015 0154   BILITOT 1.1 01/14/2015 0154   GFRNONAA >60 01/15/2015 0518   GFRAA >60 01/15/2015 0518    Assessment and Plan  Paroxysmal atrial fibrillation (HCC) cONT CONTROLLED ON METOPROLOL; PROPHYLAXIS  with 4 ASA 81 mg daily  Psychosis Continues controlled on depakote  Schizophrenia ([place]) Pt does not appear fearful or unhappy; cont depakote    Hennie Duos, MD

## 2015-07-09 DIAGNOSIS — I10 Essential (primary) hypertension: Secondary | ICD-10-CM | POA: Diagnosis not present

## 2015-07-09 DIAGNOSIS — E1159 Type 2 diabetes mellitus with other circulatory complications: Secondary | ICD-10-CM | POA: Diagnosis not present

## 2015-07-09 DIAGNOSIS — M2450 Contracture, unspecified joint: Secondary | ICD-10-CM | POA: Diagnosis not present

## 2015-07-09 DIAGNOSIS — I429 Cardiomyopathy, unspecified: Secondary | ICD-10-CM | POA: Diagnosis not present

## 2015-07-09 DIAGNOSIS — G3183 Dementia with Lewy bodies: Secondary | ICD-10-CM | POA: Diagnosis not present

## 2015-07-09 DIAGNOSIS — I359 Nonrheumatic aortic valve disorder, unspecified: Secondary | ICD-10-CM | POA: Diagnosis not present

## 2015-07-09 DIAGNOSIS — L8990 Pressure ulcer of unspecified site, unspecified stage: Secondary | ICD-10-CM | POA: Diagnosis not present

## 2015-07-09 DIAGNOSIS — E785 Hyperlipidemia, unspecified: Secondary | ICD-10-CM | POA: Diagnosis not present

## 2015-07-09 DIAGNOSIS — K219 Gastro-esophageal reflux disease without esophagitis: Secondary | ICD-10-CM | POA: Diagnosis not present

## 2015-07-09 DIAGNOSIS — J309 Allergic rhinitis, unspecified: Secondary | ICD-10-CM | POA: Diagnosis not present

## 2015-07-09 DIAGNOSIS — R630 Anorexia: Secondary | ICD-10-CM | POA: Diagnosis not present

## 2015-07-09 DIAGNOSIS — F319 Bipolar disorder, unspecified: Secondary | ICD-10-CM | POA: Diagnosis not present

## 2015-07-09 DIAGNOSIS — G9009 Other idiopathic peripheral autonomic neuropathy: Secondary | ICD-10-CM | POA: Diagnosis not present

## 2015-07-09 DIAGNOSIS — R634 Abnormal weight loss: Secondary | ICD-10-CM | POA: Diagnosis not present

## 2015-07-09 DIAGNOSIS — I4891 Unspecified atrial fibrillation: Secondary | ICD-10-CM | POA: Diagnosis not present

## 2015-07-09 DIAGNOSIS — I679 Cerebrovascular disease, unspecified: Secondary | ICD-10-CM | POA: Diagnosis not present

## 2015-07-09 DIAGNOSIS — R1310 Dysphagia, unspecified: Secondary | ICD-10-CM | POA: Diagnosis not present

## 2015-07-09 DIAGNOSIS — N189 Chronic kidney disease, unspecified: Secondary | ICD-10-CM | POA: Diagnosis not present

## 2015-07-13 ENCOUNTER — Encounter: Payer: Self-pay | Admitting: Internal Medicine

## 2015-07-13 NOTE — Assessment & Plan Note (Signed)
Pt does not appear fearful or unhappy; cont depakote

## 2015-07-13 NOTE — Assessment & Plan Note (Signed)
cONT CONTROLLED ON METOPROLOL; PROPHYLAXIS with 4 ASA 81 mg daily

## 2015-07-13 NOTE — Assessment & Plan Note (Signed)
Continues controlled on depakote

## 2015-07-17 DIAGNOSIS — L89613 Pressure ulcer of right heel, stage 3: Secondary | ICD-10-CM | POA: Diagnosis not present

## 2015-07-17 DIAGNOSIS — L89213 Pressure ulcer of right hip, stage 3: Secondary | ICD-10-CM | POA: Diagnosis not present

## 2015-07-17 DIAGNOSIS — L97522 Non-pressure chronic ulcer of other part of left foot with fat layer exposed: Secondary | ICD-10-CM | POA: Diagnosis not present

## 2015-07-17 DIAGNOSIS — L89623 Pressure ulcer of left heel, stage 3: Secondary | ICD-10-CM | POA: Diagnosis not present

## 2015-07-17 DIAGNOSIS — L97512 Non-pressure chronic ulcer of other part of right foot with fat layer exposed: Secondary | ICD-10-CM | POA: Diagnosis not present

## 2015-07-24 DIAGNOSIS — L89613 Pressure ulcer of right heel, stage 3: Secondary | ICD-10-CM | POA: Diagnosis not present

## 2015-07-24 DIAGNOSIS — L97522 Non-pressure chronic ulcer of other part of left foot with fat layer exposed: Secondary | ICD-10-CM | POA: Diagnosis not present

## 2015-07-24 DIAGNOSIS — L89623 Pressure ulcer of left heel, stage 3: Secondary | ICD-10-CM | POA: Diagnosis not present

## 2015-07-24 DIAGNOSIS — L97512 Non-pressure chronic ulcer of other part of right foot with fat layer exposed: Secondary | ICD-10-CM | POA: Diagnosis not present

## 2015-07-24 DIAGNOSIS — L89213 Pressure ulcer of right hip, stage 3: Secondary | ICD-10-CM | POA: Diagnosis not present

## 2015-07-31 ENCOUNTER — Non-Acute Institutional Stay (SKILLED_NURSING_FACILITY): Payer: Medicare Other | Admitting: Internal Medicine

## 2015-07-31 ENCOUNTER — Encounter: Payer: Self-pay | Admitting: Internal Medicine

## 2015-07-31 DIAGNOSIS — L97512 Non-pressure chronic ulcer of other part of right foot with fat layer exposed: Secondary | ICD-10-CM | POA: Diagnosis not present

## 2015-07-31 DIAGNOSIS — R488 Other symbolic dysfunctions: Secondary | ICD-10-CM

## 2015-07-31 DIAGNOSIS — L89613 Pressure ulcer of right heel, stage 3: Secondary | ICD-10-CM | POA: Diagnosis not present

## 2015-07-31 DIAGNOSIS — L89623 Pressure ulcer of left heel, stage 3: Secondary | ICD-10-CM | POA: Diagnosis not present

## 2015-07-31 DIAGNOSIS — L89213 Pressure ulcer of right hip, stage 3: Secondary | ICD-10-CM | POA: Diagnosis not present

## 2015-07-31 DIAGNOSIS — L97522 Non-pressure chronic ulcer of other part of left foot with fat layer exposed: Secondary | ICD-10-CM | POA: Diagnosis not present

## 2015-07-31 NOTE — Progress Notes (Signed)
MRN: [id number] Name: Emma Mcguire  Sex: female Age: 82 y.o. DOB: Jan 06, 1928  [place] #: [facility] Facility/Room:202 D Level Of Care: SNF Provider: Noah Delaine. Sheppard Coil, MD Emergency Contacts: Extended Emergency Contact Information Primary Emergency Contact: Azeez,James Address: [address] [place], [place] [address] [place] Phone: [phone] Relation: Son Secondary Emergency Contact: Rudell Cobb [place] of [place] Phone: [phone] Relation: Other  Code Status:  DNR  Allergies: Cymbalta; Lipitor; Metformin and related; Simvastatin; and Verapamil  Chief Complaint  Patient presents with  . Acute Visit    Acute    HPI: Patient is 82 y.o. female with FTT who has in past few days started repeating the same words and phrasing over and over per Hospice nurse who has asked me to see her. Per hospice and per pt herself she is not upset or worried but family WAS upset and kept trying to redirect her which did not help. There are no signs or sx of infection. Pt is not confused.  Past Medical History  Diagnosis Date  . Hypertension   . Alzheimer disease   . Fibromyalgia   . Atrial fibrillation ([place])   . Colon polyp   . GERD (gastroesophageal reflux disease)   . Diabetes mellitus without complication ([place])   . Neuropathy ([place])   . HOCM (hypertrophic obstructive cardiomyopathy) ([place])   . Chronic kidney disease     Past Surgical History  Procedure Laterality Date  . Shoulder surgery Left     rotator cuff      Medication List       This list is accurate as of: 07/31/15  6:40 PM.  Always use your most recent med list.               amLODipine 5 MG tablet  Commonly known as:  NORVASC  Take 5 mg by mouth daily.     aspirin 81 MG chewable tablet  Chew 4 tablets (324 mg total) by mouth daily.     atorvastatin 10 MG tablet  Commonly known as:  LIPITOR  Take 1 tablet (10 mg total) by mouth daily at 6 PM.     divalproex  125 MG capsule  Commonly known as:  DEPAKOTE SPRINKLE  Take 2 capsules (250 mg total) by mouth every 12 (twelve) hours.     docusate sodium 100 MG capsule  Commonly known as:  COLACE  Take 1 capsule (100 mg total) by mouth daily.     escitalopram 5 MG tablet  Commonly known as:  LEXAPRO  Take 10 mg by mouth daily.     fluticasone 50 MCG/ACT nasal spray  Commonly known as:  FLONASE  Place 2 sprays into both nostrils daily.     HYDROcodone-acetaminophen 5-325 MG tablet  Commonly known as:  NORCO/VICODIN  Take 1 tablet by mouth every 4 (four) hours as needed for moderate pain.     HYDROcodone-acetaminophen 5-325 MG tablet  Commonly known as:  NORCO/VICODIN  Take by mouth. Give one tablet by mouth 30 mins prior to dressing change.     loratadine 10 MG tablet  Commonly known as:  CLARITIN  Take 10 mg by mouth daily as needed for allergies.     metoprolol tartrate 25 mg/10 mL Susp  Commonly known as:  LOPRESSOR  Give 15 mls (75 mg) by mouth twice daily     omeprazole 40 MG capsule  Commonly known as:  PRILOSEC  Take 40 mg by mouth daily.     SENNA S PO  Take by mouth at bedtime.     THERA Tabs  Take 1 tablet by mouth daily.     UNABLE TO FIND  Med Name: Med Pass initiate 4 oz  three times daily     vitamin B-12 1000 MCG tablet  Commonly known as:  CYANOCOBALAMIN  Take 1,000 mcg by mouth daily.        Meds ordered this encounter  Medications  . metoprolol tartrate (LOPRESSOR) 25 mg/10 mL SUSP    Sig: Give 15 mls (75 mg) by mouth twice daily  . UNABLE TO FIND    Sig: Med Name: Med Pass initiate 4 oz  three times daily    Immunization History  Administered Date(s) Administered  . PPD Test 06/08/2015, 06/25/2015    Social History  Substance Use Topics  . Smoking status: Never Smoker   . Smokeless tobacco: Not on file  . Alcohol Use: No    Review of Systems pt unable to fully participate but denies pain or anxiety    Filed Vitals:   07/31/15 1420   BP: 185/98  Pulse: 81  Temp: 98 F (36.7 C)  Resp: 16    Physical Exam  GENERAL APPEARANCE: Alert, conversant, No acute distress  SKIN: No diaphoresis rash HEENT: Unremarkable RESPIRATORY: Breathing is even, unlabored. Lung sounds are clear   CARDIOVASCULAR: Heart RRR no murmurs, rubs or gallops. No peripheral edema  GASTROINTESTINAL: Abdomen is soft, non-tender, not distended w/ normal bowel sounds.  GENITOURINARY: Bladder non tender, not distended  MUSCULOSKELETAL: No abnormal joints or musculature NEUROLOGIC: Cranial nerves 2-12 grossly intact. Moves all extremities PSYCHIATRIC: pt has echolalia and palilalia but seems in no distress, no behavioral issues  Patient Active Problem List   Diagnosis Date Noted  . Corn or callus 02/06/2015  . Dizziness 02/06/2015  . Neuropathy ([place]) 02/06/2015  . Secondary parkinsonism ([place])   . Depression   . Anxiety state   . Essential hypertension   . HLD (hyperlipidemia)   . Nonorganic psychosis   . Atrial fibrillation ([place]) 01/12/2015  . Sepsis ([place]) 01/12/2015  . Ischemic stroke ([place]) 01/12/2015  . Acute encephalopathy   . Paroxysmal atrial fibrillation (HCC)   . Schizophrenia ([place])   . Atrial fibrillation with RVR ([place]) 01/11/2015  . SVT (supraventricular tachycardia) ([place]) 01/11/2015  . Alzheimer disease   . Diabetes mellitus without complication ([place])   . HOCM (hypertrophic obstructive cardiomyopathy) ([place])   . HTN (hypertension) 01/07/2015  . Edema 01/06/2015  . Blister of skin without infection 01/05/2015  . Parkinsonian features 12/25/2014  . Weakness 12/18/2014  . Altered mental status 12/11/2014  . UTI (urinary tract infection) 12/02/2014  . GERD (gastroesophageal reflux disease) 12/02/2014  . Psychosis 12/02/2014  . Renal failure (ARF), acute on chronic (HCC) 11/24/2014  . MDD (major depressive disorder) ([place]) 11/24/2014  . Pressure ulcer 11/24/2014  . Elevated troponin 11/23/2014  . Difficulty hearing 11/07/2014   . Alzheimer's dementia with behavioral disturbance 11/03/2014  . Severe episode of recurrent major depressive disorder, without psychotic features ([place]) 11/02/2014  . Chronic kidney disease (CKD), stage III (moderate) 09/17/2014  . Rotator cuff arthropathy 07/21/2014  . Diabetic polyneuropathy ([place]) 05/21/2014  . Acid reflux 05/21/2014  . Aortic heart valve narrowing 02/24/2014  . Acquired deformities of toe 07/12/2013  . Hypertrophic obstructive cardiomyopathy ([place]) 07/04/2013  . Left ventricular outflow obstruction 05/31/2012  . Left ventricular hypertrophy 05/07/2011  . Sick sinus syndrome (  [place]) 05/07/2011  . Cardiac conduction disorder 05/01/2011  . Diabetes mellitus ([place]) 05/01/2011  . Degeneration of intervertebral disc of lumbosacral region 01/27/2011  . Chronic kidney disease 12/16/2010  . Absence of bladder continence 12/16/2010  . Diverticulitis 06/03/2010  . Fibromyalgia 06/03/2010  . Arthritis, degenerative 06/03/2010  . Adenomatous colon polyp 08/03/2006    CBC    Component Value Date/Time   WBC 9.4 01/14/2015 0154   RBC 3.92 01/14/2015 0154   HGB 11.5* 01/14/2015 0154   HCT 35.6* 01/14/2015 0154   PLT 278 01/14/2015 0154   MCV 90.8 01/14/2015 0154   LYMPHSABS 1.4 01/14/2015 0154   MONOABS 0.7 01/14/2015 0154   EOSABS 0.1 01/14/2015 0154   BASOSABS 0.0 01/14/2015 0154    CMP     Component Value Date/Time   NA 138 01/15/2015 0518   K 4.0 01/15/2015 0518   CL 113* 01/15/2015 0518   CO2 18* 01/15/2015 0518   GLUCOSE 139* 01/15/2015 0518   BUN 7 01/15/2015 0518   CREATININE 0.69 01/15/2015 0518   CALCIUM 8.9 01/15/2015 0518   PROT 4.9* 01/14/2015 0154   ALBUMIN 2.0* 01/14/2015 0154   AST 25 01/14/2015 0154   ALT 31 01/14/2015 0154   ALKPHOS 93 01/14/2015 0154   BILITOT 1.1 01/14/2015 0154   GFRNONAA >60 01/15/2015 0518   GFRAA >60 01/15/2015 0518    Assessment and Plan  ECHOLALIA/PALILALIA - Part of her dementia pattern, new, and does not appear  disturbing to patient; will monitor and provide anxiolytics if PT needs them and education to family   Time spent > 25 min;> 50% of time with patient was spent reviewing records, labs, tests and studies, counseling and developing plan of care  Webb Silversmith D. Sheppard Coil, MD

## 2015-08-07 DIAGNOSIS — L89613 Pressure ulcer of right heel, stage 3: Secondary | ICD-10-CM | POA: Diagnosis not present

## 2015-08-07 DIAGNOSIS — L89623 Pressure ulcer of left heel, stage 3: Secondary | ICD-10-CM | POA: Diagnosis not present

## 2015-08-07 DIAGNOSIS — L97522 Non-pressure chronic ulcer of other part of left foot with fat layer exposed: Secondary | ICD-10-CM | POA: Diagnosis not present

## 2015-08-07 DIAGNOSIS — L97512 Non-pressure chronic ulcer of other part of right foot with fat layer exposed: Secondary | ICD-10-CM | POA: Diagnosis not present

## 2015-08-07 DIAGNOSIS — L89213 Pressure ulcer of right hip, stage 3: Secondary | ICD-10-CM | POA: Diagnosis not present

## 2015-08-09 DIAGNOSIS — M2450 Contracture, unspecified joint: Secondary | ICD-10-CM | POA: Diagnosis not present

## 2015-08-09 DIAGNOSIS — J309 Allergic rhinitis, unspecified: Secondary | ICD-10-CM | POA: Diagnosis not present

## 2015-08-09 DIAGNOSIS — I4891 Unspecified atrial fibrillation: Secondary | ICD-10-CM | POA: Diagnosis not present

## 2015-08-09 DIAGNOSIS — F319 Bipolar disorder, unspecified: Secondary | ICD-10-CM | POA: Diagnosis not present

## 2015-08-09 DIAGNOSIS — I10 Essential (primary) hypertension: Secondary | ICD-10-CM | POA: Diagnosis not present

## 2015-08-09 DIAGNOSIS — L8990 Pressure ulcer of unspecified site, unspecified stage: Secondary | ICD-10-CM | POA: Diagnosis not present

## 2015-08-09 DIAGNOSIS — E785 Hyperlipidemia, unspecified: Secondary | ICD-10-CM | POA: Diagnosis not present

## 2015-08-09 DIAGNOSIS — G3183 Dementia with Lewy bodies: Secondary | ICD-10-CM | POA: Diagnosis not present

## 2015-08-09 DIAGNOSIS — R630 Anorexia: Secondary | ICD-10-CM | POA: Diagnosis not present

## 2015-08-09 DIAGNOSIS — I429 Cardiomyopathy, unspecified: Secondary | ICD-10-CM | POA: Diagnosis not present

## 2015-08-09 DIAGNOSIS — E1159 Type 2 diabetes mellitus with other circulatory complications: Secondary | ICD-10-CM | POA: Diagnosis not present

## 2015-08-09 DIAGNOSIS — I359 Nonrheumatic aortic valve disorder, unspecified: Secondary | ICD-10-CM | POA: Diagnosis not present

## 2015-08-09 DIAGNOSIS — N189 Chronic kidney disease, unspecified: Secondary | ICD-10-CM | POA: Diagnosis not present

## 2015-08-09 DIAGNOSIS — I679 Cerebrovascular disease, unspecified: Secondary | ICD-10-CM | POA: Diagnosis not present

## 2015-08-09 DIAGNOSIS — G9009 Other idiopathic peripheral autonomic neuropathy: Secondary | ICD-10-CM | POA: Diagnosis not present

## 2015-08-09 DIAGNOSIS — R1310 Dysphagia, unspecified: Secondary | ICD-10-CM | POA: Diagnosis not present

## 2015-08-09 DIAGNOSIS — R634 Abnormal weight loss: Secondary | ICD-10-CM | POA: Diagnosis not present

## 2015-08-09 DIAGNOSIS — K219 Gastro-esophageal reflux disease without esophagitis: Secondary | ICD-10-CM | POA: Diagnosis not present

## 2015-08-15 DIAGNOSIS — L97522 Non-pressure chronic ulcer of other part of left foot with fat layer exposed: Secondary | ICD-10-CM | POA: Diagnosis not present

## 2015-08-15 DIAGNOSIS — L89213 Pressure ulcer of right hip, stage 3: Secondary | ICD-10-CM | POA: Diagnosis not present

## 2015-08-15 DIAGNOSIS — L89890 Pressure ulcer of other site, unstageable: Secondary | ICD-10-CM | POA: Diagnosis not present

## 2015-08-15 DIAGNOSIS — L89613 Pressure ulcer of right heel, stage 3: Secondary | ICD-10-CM | POA: Diagnosis not present

## 2015-08-15 DIAGNOSIS — L89623 Pressure ulcer of left heel, stage 3: Secondary | ICD-10-CM | POA: Diagnosis not present

## 2015-08-15 DIAGNOSIS — L97512 Non-pressure chronic ulcer of other part of right foot with fat layer exposed: Secondary | ICD-10-CM | POA: Diagnosis not present

## 2015-08-17 ENCOUNTER — Other Ambulatory Visit: Payer: Self-pay

## 2015-08-17 MED ORDER — HYDROCODONE-ACETAMINOPHEN 5-325 MG PO TABS: ORAL_TABLET | ORAL | Status: DC

## 2015-08-17 NOTE — Telephone Encounter (Signed)
Faxed to [facility] Fax Number: [phone], Phone Number [phone]  

## 2015-08-21 DIAGNOSIS — L89613 Pressure ulcer of right heel, stage 3: Secondary | ICD-10-CM | POA: Diagnosis not present

## 2015-08-21 DIAGNOSIS — L89213 Pressure ulcer of right hip, stage 3: Secondary | ICD-10-CM | POA: Diagnosis not present

## 2015-08-21 DIAGNOSIS — L89623 Pressure ulcer of left heel, stage 3: Secondary | ICD-10-CM | POA: Diagnosis not present

## 2015-08-21 DIAGNOSIS — L89890 Pressure ulcer of other site, unstageable: Secondary | ICD-10-CM | POA: Diagnosis not present

## 2015-08-21 DIAGNOSIS — L97522 Non-pressure chronic ulcer of other part of left foot with fat layer exposed: Secondary | ICD-10-CM | POA: Diagnosis not present

## 2015-08-21 DIAGNOSIS — E538 Deficiency of other specified B group vitamins: Secondary | ICD-10-CM | POA: Diagnosis not present

## 2015-08-21 DIAGNOSIS — L97512 Non-pressure chronic ulcer of other part of right foot with fat layer exposed: Secondary | ICD-10-CM | POA: Diagnosis not present

## 2015-08-24 ENCOUNTER — Non-Acute Institutional Stay (SKILLED_NURSING_FACILITY): Payer: Medicare Other | Admitting: Internal Medicine

## 2015-08-24 ENCOUNTER — Encounter: Payer: Self-pay | Admitting: Internal Medicine

## 2015-08-24 DIAGNOSIS — G3183 Dementia with Lewy bodies: Secondary | ICD-10-CM

## 2015-08-24 DIAGNOSIS — F02818 Dementia in other diseases classified elsewhere, unspecified severity, with other behavioral disturbance: Secondary | ICD-10-CM

## 2015-08-24 DIAGNOSIS — R627 Adult failure to thrive: Secondary | ICD-10-CM

## 2015-08-24 DIAGNOSIS — G309 Alzheimer's disease, unspecified: Secondary | ICD-10-CM

## 2015-08-24 DIAGNOSIS — I421 Obstructive hypertrophic cardiomyopathy: Secondary | ICD-10-CM

## 2015-08-24 DIAGNOSIS — G308 Other Alzheimer's disease: Secondary | ICD-10-CM | POA: Diagnosis not present

## 2015-08-24 DIAGNOSIS — F0281 Dementia in other diseases classified elsewhere with behavioral disturbance: Secondary | ICD-10-CM

## 2015-08-24 DIAGNOSIS — I480 Paroxysmal atrial fibrillation: Secondary | ICD-10-CM | POA: Diagnosis not present

## 2015-08-24 DIAGNOSIS — F0280 Dementia in other diseases classified elsewhere without behavioral disturbance: Secondary | ICD-10-CM | POA: Diagnosis not present

## 2015-08-24 NOTE — Progress Notes (Signed)
MRN: [id number] Name: Emma Mcguire  Sex: female Age: 82 y.o. DOB: March 12, 1927  [place] #:  Facility/Room: [facility] / 202 D Level Of Care: SNF Provider: Noah Delaine. Sheppard Coil, MD Emergency Contacts: Extended Emergency Contact Information Primary Emergency Contact: Raven,James Address: [address] [place], [place] [address] [place] Phone: [phone] Relation: Son Secondary Emergency Contact: Rudell Cobb [place] Phone: [phone] Relation: Other  Code Status: DNR  Allergies: Cymbalta; Lipitor; Metformin and related; Simvastatin; and Verapamil  Chief Complaint  Patient presents with  . Medical Management of Chronic Issues    Routine Visit    HPI: Patient is 82 y.o. female who is being seen for routine issues of dementia, AF and hypertrophic obstructive cardiomyopathy.  Past Medical History  Diagnosis Date  . Hypertension   . Alzheimer disease   . Fibromyalgia   . Atrial fibrillation ([place])   . Colon polyp   . GERD (gastroesophageal reflux disease)   . Diabetes mellitus without complication ([place])   . Neuropathy ([place])   . HOCM (hypertrophic obstructive cardiomyopathy) ([place])   . Chronic kidney disease     Past Surgical History  Procedure Laterality Date  . Shoulder surgery Left     rotator cuff      Medication List       This list is accurate as of: 08/24/15 11:59 PM.  Always use your most recent med list.               amLODipine 5 MG tablet  Commonly known as:  NORVASC  Take 5 mg by mouth daily.     aspirin 81 MG chewable tablet  Chew 4 tablets (324 mg total) by mouth daily.     ATIVAN 0.5 MG tablet  Generic drug:  LORazepam  Take 0.25 mg by mouth every 8 (eight) hours as needed for anxiety.     atorvastatin 10 MG tablet  Commonly known as:  LIPITOR  Take 1 tablet (10 mg total) by mouth daily at 6 PM.     divalproex 125 MG capsule  Commonly known as:  DEPAKOTE SPRINKLE  Take 2 capsules (250  mg total) by mouth every 12 (twelve) hours.     docusate sodium 100 MG capsule  Commonly known as:  COLACE  Take 1 capsule (100 mg total) by mouth daily.     escitalopram 5 MG tablet  Commonly known as:  LEXAPRO  Take 10 mg by mouth daily.     feeding supplement (PRO-STAT SUGAR FREE 64) Liqd  Take 30 mLs by mouth 2 (two) times daily between meals.     fluticasone 50 MCG/ACT nasal spray  Commonly known as:  FLONASE  Place 2 sprays into both nostrils daily.     HYDROcodone-acetaminophen 5-325 MG tablet  Commonly known as:  NORCO/VICODIN  Give one tablet by mouth 30 mins prior to dressing change.     HYDROcodone-acetaminophen 5-325 MG tablet  Commonly known as:  NORCO/VICODIN  Take 1 tablet by mouth every 4 (four) hours as needed for moderate pain.     loratadine 10 MG tablet  Commonly known as:  CLARITIN  Take 10 mg by mouth daily as needed for allergies.     metoprolol tartrate 25 mg/10 mL Susp  Commonly known as:  LOPRESSOR  Give 15 mls (75 mg) by mouth twice daily     omeprazole 40 MG capsule  Commonly known as:  PRILOSEC  Take 40 mg by mouth daily.     sennosides-docusate sodium 8.6-50 MG tablet  Commonly known as:  SENOKOT-S  Take 1 tablet by mouth at bedtime.     THERA Tabs  Take 1 tablet by mouth daily.     UNABLE TO FIND  Med Name: Med Pass initiate 4 oz  three times daily     vitamin B-12 1000 MCG tablet  Commonly known as:  CYANOCOBALAMIN  Take 1,000 mcg by mouth daily.        Meds ordered this encounter  Medications  . Amino Acids-Protein Hydrolys (FEEDING SUPPLEMENT, PRO-STAT SUGAR FREE 64,) LIQD    Sig: Take 30 mLs by mouth 2 (two) times daily between meals.  Marland Kitchen LORazepam (ATIVAN) 0.5 MG tablet    Sig: Take 0.25 mg by mouth every 8 (eight) hours as needed for anxiety.  Marland Kitchen HYDROcodone-acetaminophen (NORCO/VICODIN) 5-325 MG tablet    Sig: Take 1 tablet by mouth every 4 (four) hours as needed for moderate pain.  Marland Kitchen sennosides-docusate sodium  (SENOKOT-S) 8.6-50 MG tablet    Sig: Take 1 tablet by mouth at bedtime.    Immunization History  Administered Date(s) Administered  . PPD Test 06/08/2015, 06/25/2015    Social History  Substance Use Topics  . Smoking status: Never Smoker   . Smokeless tobacco: Not on file  . Alcohol Use: No    Review of Systems  UTO 2/2 dementia    Filed Vitals:   08/24/15 1144  BP: 160/74  Pulse: 80  Temp: 98 F (36.7 C)  Resp: 18    Physical Exam  GENERAL APPEARANCE: Alert, No acute distress; thin WF in gerichair  SKIN: No diaphoresis rash HEENT: Unremarkable RESPIRATORY: Breathing is even, unlabored. Lung sounds are clear   CARDIOVASCULAR: Heart RRR no murmurs, rubs or gallops. No peripheral edema  GASTROINTESTINAL: Abdomen is soft, non-tender, not distended w/ normal bowel sounds.  GENITOURINARY: Bladder non tender, not distended  MUSCULOSKELETAL: very thin NEUROLOGIC: Cranial nerves 2-12 grossly intact. Moves all extremities PSYCHIATRIC: dementia, no behavioral issues  Patient Active Problem List   Diagnosis Date Noted  . FTT (failure to thrive) in adult 09/01/2015  . Lewy body dementia 09/01/2015  . Corn or callus 02/06/2015  . Dizziness 02/06/2015  . Neuropathy ([place]) 02/06/2015  . Secondary parkinsonism ([place])   . Depression   . Anxiety state   . Essential hypertension   . HLD (hyperlipidemia)   . Nonorganic psychosis   . Atrial fibrillation ([place]) 01/12/2015  . Sepsis ([place]) 01/12/2015  . Ischemic stroke ([place]) 01/12/2015  . Acute encephalopathy   . Paroxysmal atrial fibrillation (HCC)   . Schizophrenia ([place])   . Atrial fibrillation with RVR ([place]) 01/11/2015  . SVT (supraventricular tachycardia) ([place]) 01/11/2015  . Alzheimer disease   . Diabetes mellitus without complication ([place])   . HOCM (hypertrophic obstructive cardiomyopathy) ([place])   . HTN (hypertension) 01/07/2015  . Edema 01/06/2015  . Blister of skin without infection 01/05/2015  . Parkinsonian features  12/25/2014  . Weakness 12/18/2014  . Altered mental status 12/11/2014  . UTI (urinary tract infection) 12/02/2014  . GERD (gastroesophageal reflux disease) 12/02/2014  . Psychosis 12/02/2014  . Renal failure (ARF), acute on chronic (HCC) 11/24/2014  . MDD (major depressive disorder) ([place]) 11/24/2014  . Pressure ulcer 11/24/2014  . Elevated troponin 11/23/2014  . Difficulty hearing 11/07/2014  . Alzheimer's dementia with behavioral disturbance 11/03/2014  . Severe episode of recurrent major depressive disorder, without psychotic features ([place]) 11/02/2014  . Chronic  kidney disease (CKD), stage III (moderate) 09/17/2014  . Rotator cuff arthropathy 07/21/2014  . Diabetic polyneuropathy ([place]) 05/21/2014  . Acid reflux 05/21/2014  . Aortic heart valve narrowing 02/24/2014  . Acquired deformities of toe 07/12/2013  . Hypertrophic obstructive cardiomyopathy ([place]) 07/04/2013  . Left ventricular outflow obstruction 05/31/2012  . Left ventricular hypertrophy 05/07/2011  . Sick sinus syndrome ([place]) 05/07/2011  . Cardiac conduction disorder 05/01/2011  . Diabetes mellitus ([place]) 05/01/2011  . Degeneration of intervertebral disc of lumbosacral region 01/27/2011  . Chronic kidney disease 12/16/2010  . Absence of bladder continence 12/16/2010  . Diverticulitis 06/03/2010  . Fibromyalgia 06/03/2010  . Arthritis, degenerative 06/03/2010  . Adenomatous colon polyp 08/03/2006    CBC    Component Value Date/Time   WBC 9.4 01/14/2015 0154   RBC 3.92 01/14/2015 0154   HGB 11.5* 01/14/2015 0154   HCT 35.6* 01/14/2015 0154   PLT 278 01/14/2015 0154   MCV 90.8 01/14/2015 0154   LYMPHSABS 1.4 01/14/2015 0154   MONOABS 0.7 01/14/2015 0154   EOSABS 0.1 01/14/2015 0154   BASOSABS 0.0 01/14/2015 0154    CMP     Component Value Date/Time   NA 138 01/15/2015 0518   K 4.0 01/15/2015 0518   CL 113* 01/15/2015 0518   CO2 18* 01/15/2015 0518   GLUCOSE 139* 01/15/2015 0518   BUN 7 01/15/2015 0518    CREATININE 0.69 01/15/2015 0518   CALCIUM 8.9 01/15/2015 0518   PROT 4.9* 01/14/2015 0154   ALBUMIN 2.0* 01/14/2015 0154   AST 25 01/14/2015 0154   ALT 31 01/14/2015 0154   ALKPHOS 93 01/14/2015 0154   BILITOT 1.1 01/14/2015 0154   GFRNONAA >60 01/15/2015 0518   GFRAA >60 01/15/2015 0518    Assessment and Plan  Alzheimer's dementia with behavioral disturbance/Lewy body dementia/FTT Mood appears stable ;cont depakote and lexapro to cont stability; pt is slowly declining, a marked difference from time of admit to now;pt appears comfortable  Paroxysmal atrial fibrillation (HCC) Controlled with metoprolol with prophylaxis with 325 mg ASA  Hypertrophic obstructive cardiomyopathy (HCC) Stable on metoprolol    Noah Delaine. Sheppard Coil, MD

## 2015-08-28 DIAGNOSIS — L97512 Non-pressure chronic ulcer of other part of right foot with fat layer exposed: Secondary | ICD-10-CM | POA: Diagnosis not present

## 2015-08-28 DIAGNOSIS — L89623 Pressure ulcer of left heel, stage 3: Secondary | ICD-10-CM | POA: Diagnosis not present

## 2015-08-28 DIAGNOSIS — L97522 Non-pressure chronic ulcer of other part of left foot with fat layer exposed: Secondary | ICD-10-CM | POA: Diagnosis not present

## 2015-08-28 DIAGNOSIS — L89613 Pressure ulcer of right heel, stage 3: Secondary | ICD-10-CM | POA: Diagnosis not present

## 2015-08-28 DIAGNOSIS — L89213 Pressure ulcer of right hip, stage 3: Secondary | ICD-10-CM | POA: Diagnosis not present

## 2015-08-28 DIAGNOSIS — L89890 Pressure ulcer of other site, unstageable: Secondary | ICD-10-CM | POA: Diagnosis not present

## 2015-09-01 ENCOUNTER — Encounter: Payer: Self-pay | Admitting: Internal Medicine

## 2015-09-01 DIAGNOSIS — F0280 Dementia in other diseases classified elsewhere without behavioral disturbance: Secondary | ICD-10-CM | POA: Insufficient documentation

## 2015-09-01 DIAGNOSIS — R627 Adult failure to thrive: Secondary | ICD-10-CM | POA: Insufficient documentation

## 2015-09-01 DIAGNOSIS — G3183 Dementia with Lewy bodies: Secondary | ICD-10-CM

## 2015-09-04 DIAGNOSIS — L89213 Pressure ulcer of right hip, stage 3: Secondary | ICD-10-CM | POA: Diagnosis not present

## 2015-09-04 DIAGNOSIS — L89613 Pressure ulcer of right heel, stage 3: Secondary | ICD-10-CM | POA: Diagnosis not present

## 2015-09-04 DIAGNOSIS — L89623 Pressure ulcer of left heel, stage 3: Secondary | ICD-10-CM | POA: Diagnosis not present

## 2015-09-04 DIAGNOSIS — L97522 Non-pressure chronic ulcer of other part of left foot with fat layer exposed: Secondary | ICD-10-CM | POA: Diagnosis not present

## 2015-09-04 DIAGNOSIS — L89150 Pressure ulcer of sacral region, unstageable: Secondary | ICD-10-CM | POA: Diagnosis not present

## 2015-09-04 DIAGNOSIS — L97512 Non-pressure chronic ulcer of other part of right foot with fat layer exposed: Secondary | ICD-10-CM | POA: Diagnosis not present

## 2015-09-04 DIAGNOSIS — L89890 Pressure ulcer of other site, unstageable: Secondary | ICD-10-CM | POA: Diagnosis not present

## 2015-09-08 DIAGNOSIS — I4891 Unspecified atrial fibrillation: Secondary | ICD-10-CM | POA: Diagnosis not present

## 2015-09-08 DIAGNOSIS — M2450 Contracture, unspecified joint: Secondary | ICD-10-CM | POA: Diagnosis not present

## 2015-09-08 DIAGNOSIS — I429 Cardiomyopathy, unspecified: Secondary | ICD-10-CM | POA: Diagnosis not present

## 2015-09-08 DIAGNOSIS — G3183 Dementia with Lewy bodies: Secondary | ICD-10-CM | POA: Diagnosis not present

## 2015-09-08 DIAGNOSIS — F319 Bipolar disorder, unspecified: Secondary | ICD-10-CM | POA: Diagnosis not present

## 2015-09-08 DIAGNOSIS — K219 Gastro-esophageal reflux disease without esophagitis: Secondary | ICD-10-CM | POA: Diagnosis not present

## 2015-09-08 DIAGNOSIS — I679 Cerebrovascular disease, unspecified: Secondary | ICD-10-CM | POA: Diagnosis not present

## 2015-09-08 DIAGNOSIS — N189 Chronic kidney disease, unspecified: Secondary | ICD-10-CM | POA: Diagnosis not present

## 2015-09-08 DIAGNOSIS — R630 Anorexia: Secondary | ICD-10-CM | POA: Diagnosis not present

## 2015-09-08 DIAGNOSIS — E1159 Type 2 diabetes mellitus with other circulatory complications: Secondary | ICD-10-CM | POA: Diagnosis not present

## 2015-09-08 DIAGNOSIS — G9009 Other idiopathic peripheral autonomic neuropathy: Secondary | ICD-10-CM | POA: Diagnosis not present

## 2015-09-08 DIAGNOSIS — I359 Nonrheumatic aortic valve disorder, unspecified: Secondary | ICD-10-CM | POA: Diagnosis not present

## 2015-09-08 DIAGNOSIS — I10 Essential (primary) hypertension: Secondary | ICD-10-CM | POA: Diagnosis not present

## 2015-09-08 DIAGNOSIS — L8990 Pressure ulcer of unspecified site, unspecified stage: Secondary | ICD-10-CM | POA: Diagnosis not present

## 2015-09-08 DIAGNOSIS — R634 Abnormal weight loss: Secondary | ICD-10-CM | POA: Diagnosis not present

## 2015-09-08 DIAGNOSIS — J309 Allergic rhinitis, unspecified: Secondary | ICD-10-CM | POA: Diagnosis not present

## 2015-09-08 DIAGNOSIS — E785 Hyperlipidemia, unspecified: Secondary | ICD-10-CM | POA: Diagnosis not present

## 2015-09-08 DIAGNOSIS — R1310 Dysphagia, unspecified: Secondary | ICD-10-CM | POA: Diagnosis not present

## 2015-09-11 DIAGNOSIS — L97512 Non-pressure chronic ulcer of other part of right foot with fat layer exposed: Secondary | ICD-10-CM | POA: Diagnosis not present

## 2015-09-11 DIAGNOSIS — L89623 Pressure ulcer of left heel, stage 3: Secondary | ICD-10-CM | POA: Diagnosis not present

## 2015-09-11 DIAGNOSIS — L89613 Pressure ulcer of right heel, stage 3: Secondary | ICD-10-CM | POA: Diagnosis not present

## 2015-09-11 DIAGNOSIS — L89150 Pressure ulcer of sacral region, unstageable: Secondary | ICD-10-CM | POA: Diagnosis not present

## 2015-09-11 DIAGNOSIS — L97522 Non-pressure chronic ulcer of other part of left foot with fat layer exposed: Secondary | ICD-10-CM | POA: Diagnosis not present

## 2015-09-11 DIAGNOSIS — L89890 Pressure ulcer of other site, unstageable: Secondary | ICD-10-CM | POA: Diagnosis not present

## 2015-09-11 DIAGNOSIS — L89213 Pressure ulcer of right hip, stage 3: Secondary | ICD-10-CM | POA: Diagnosis not present

## 2015-09-18 DIAGNOSIS — L97522 Non-pressure chronic ulcer of other part of left foot with fat layer exposed: Secondary | ICD-10-CM | POA: Diagnosis not present

## 2015-09-18 DIAGNOSIS — L89150 Pressure ulcer of sacral region, unstageable: Secondary | ICD-10-CM | POA: Diagnosis not present

## 2015-09-18 DIAGNOSIS — L89890 Pressure ulcer of other site, unstageable: Secondary | ICD-10-CM | POA: Diagnosis not present

## 2015-09-18 DIAGNOSIS — L89623 Pressure ulcer of left heel, stage 3: Secondary | ICD-10-CM | POA: Diagnosis not present

## 2015-09-18 DIAGNOSIS — L97512 Non-pressure chronic ulcer of other part of right foot with fat layer exposed: Secondary | ICD-10-CM | POA: Diagnosis not present

## 2015-09-18 DIAGNOSIS — L89613 Pressure ulcer of right heel, stage 3: Secondary | ICD-10-CM | POA: Diagnosis not present

## 2015-09-18 DIAGNOSIS — L89213 Pressure ulcer of right hip, stage 3: Secondary | ICD-10-CM | POA: Diagnosis not present

## 2015-09-25 DIAGNOSIS — L89613 Pressure ulcer of right heel, stage 3: Secondary | ICD-10-CM | POA: Diagnosis not present

## 2015-09-25 DIAGNOSIS — L89150 Pressure ulcer of sacral region, unstageable: Secondary | ICD-10-CM | POA: Diagnosis not present

## 2015-09-25 DIAGNOSIS — L97522 Non-pressure chronic ulcer of other part of left foot with fat layer exposed: Secondary | ICD-10-CM | POA: Diagnosis not present

## 2015-09-25 DIAGNOSIS — L89890 Pressure ulcer of other site, unstageable: Secondary | ICD-10-CM | POA: Diagnosis not present

## 2015-09-25 DIAGNOSIS — L89213 Pressure ulcer of right hip, stage 3: Secondary | ICD-10-CM | POA: Diagnosis not present

## 2015-09-25 DIAGNOSIS — L89623 Pressure ulcer of left heel, stage 3: Secondary | ICD-10-CM | POA: Diagnosis not present

## 2015-09-25 DIAGNOSIS — L97512 Non-pressure chronic ulcer of other part of right foot with fat layer exposed: Secondary | ICD-10-CM | POA: Diagnosis not present

## 2015-10-02 ENCOUNTER — Encounter: Payer: Self-pay | Admitting: Internal Medicine

## 2015-10-02 ENCOUNTER — Non-Acute Institutional Stay (SKILLED_NURSING_FACILITY): Payer: Medicare Other | Admitting: Internal Medicine

## 2015-10-02 DIAGNOSIS — E1122 Type 2 diabetes mellitus with diabetic chronic kidney disease: Secondary | ICD-10-CM | POA: Diagnosis not present

## 2015-10-02 DIAGNOSIS — L8990 Pressure ulcer of unspecified site, unspecified stage: Secondary | ICD-10-CM | POA: Diagnosis not present

## 2015-10-02 DIAGNOSIS — N183 Chronic kidney disease, stage 3 (moderate): Secondary | ICD-10-CM

## 2015-10-02 DIAGNOSIS — E538 Deficiency of other specified B group vitamins: Secondary | ICD-10-CM | POA: Diagnosis not present

## 2015-10-02 NOTE — Progress Notes (Signed)
MRN: [id number] Name: Emma Mcguire  Sex: female Age: 82 y.o. DOB: December 30, 1927  [place] #:  Facility/Room: [facility] / 202 D Level Of Care: SNF Provider: Noah Delaine. Sheppard Coil, MD Emergency Contacts: Extended Emergency Contact Information Primary Emergency Contact: Mathwig,James Address: [address] [place], [place] [address] [place] Phone: [phone] Relation: Son Secondary Emergency Contact: Rudell Cobb [place] Phone: [phone] Relation: Other  Code Status: DNR  Allergies: Cymbalta [duloxetine hcl]; Lipitor [atorvastatin]; Metformin and related; Simvastatin; and Verapamil  Chief Complaint  Patient presents with  . Medical Management of Chronic Issues    Routine Visit    HPI: Patient is 82 y.o. female who is being seen for routine issues of pressure ulcers, B12 deficiency and DM2.  Past Medical History:  Diagnosis Date  . Alzheimer disease   . Atrial fibrillation ([place])   . Chronic kidney disease   . Colon polyp   . Diabetes mellitus without complication ([place])   . Fibromyalgia   . GERD (gastroesophageal reflux disease)   . HOCM (hypertrophic obstructive cardiomyopathy) ([place])   . Hypertension   . Neuropathy [facility])     Past Surgical History:  Procedure Laterality Date  . SHOULDER SURGERY Left    rotator cuff      Medication List       Accurate as of 10/02/15 11:59 PM. Always use your most recent med list.          amLODipine 5 MG tablet Commonly known as:  NORVASC Take 5 mg by mouth daily.   aspirin 81 MG chewable tablet Chew 4 tablets (324 mg total) by mouth daily.   ATIVAN 0.5 MG tablet Generic drug:  LORazepam Take 0.25 mg by mouth every 8 (eight) hours as needed for anxiety.   atorvastatin 10 MG tablet Commonly known as:  LIPITOR Take 1 tablet (10 mg total) by mouth daily at 6 PM.   divalproex 125 MG capsule Commonly known as:  DEPAKOTE SPRINKLE Take 2 capsules (250mg ) by mouth each  morning and take 3 capsules (375 mg) by mouth every evening.   docusate sodium 100 MG capsule Commonly known as:  COLACE Take 1 capsule (100 mg total) by mouth daily.   escitalopram 5 MG tablet Commonly known as:  LEXAPRO Take 10 mg by mouth daily.   feeding supplement (ENSURE ENLIVE) Liqd Take 237 mLs by mouth 4 (four) times daily - after meals and at bedtime.   feeding supplement (PRO-STAT SUGAR FREE 64) Liqd Take 30 mLs by mouth 2 (two) times daily between meals.   fluticasone 50 MCG/ACT nasal spray Commonly known as:  FLONASE Place 2 sprays into both nostrils daily.   HYDROcodone-acetaminophen 5-325 MG tablet Commonly known as:  NORCO/VICODIN Give one tablet by mouth 30 mins prior to dressing change.   HYDROcodone-acetaminophen 5-325 MG tablet Commonly known as:  NORCO/VICODIN Take 1 tablet by mouth every 4 (four) hours as needed for moderate pain.   loratadine 10 MG tablet Commonly known as:  CLARITIN Take 10 mg by mouth daily as needed for allergies.   metoprolol tartrate 25 mg/10 mL Susp Commonly known as:  LOPRESSOR Give 15 mls (75 mg) by mouth twice daily   omeprazole 40 MG capsule Commonly known as:  PRILOSEC Take 40 mg by mouth daily.   sennosides-docusate sodium 8.6-50 MG tablet Commonly known as:  SENOKOT-S Take 1 tablet by mouth at bedtime.   THERA Tabs Take  1 tablet by mouth daily.   UNABLE TO FIND Med Name: Med Pass initiate 4 oz  three times daily   vitamin B-12 1000 MCG tablet Commonly known as:  CYANOCOBALAMIN Take 1,000 mcg by mouth daily.       Meds ordered this encounter  Medications  . divalproex (DEPAKOTE SPRINKLE) 125 MG capsule    Sig: Take 2 capsules (250mg ) by mouth each morning and take 3 capsules (375 mg) by mouth every evening.  . feeding supplement, ENSURE ENLIVE, (ENSURE ENLIVE) LIQD    Sig: Take 237 mLs by mouth 4 (four) times daily - after meals and at bedtime.    Immunization History  Administered Date(s)  Administered  . PPD Test 06/08/2015, 06/25/2015    Social History  Substance Use Topics  . Smoking status: Never Smoker  . Smokeless tobacco: Not on file  . Alcohol use No    Review of Systems   Pt can participate a little  DATA OBTAINED: from patient, nurse GENERAL:  no fevers, fatigue, appetite changes SKIN: No itching, rash HEENT: No complaint RESPIRATORY: No cough, wheezing, SOB CARDIAC: No chest pain, palpitations, lower extremity edema  GI: No abdominal pain, No N/V/D or constipation, No heartburn or reflux  GU: No dysuria, frequency or urgency, or incontinence  MUSCULOSKELETAL: No unrelieved bone/joint pain NEUROLOGIC: No headache, dizziness  PSYCHIATRIC: No overt anxiety or sadness  Vitals:   10/02/15 1251  BP: (!) 149/73  Pulse: 88  Resp: 16  Temp: 97 F (36.1 C)    Physical Exam  GENERAL APPEARANCE: Alert, conversant, No acute distress  SKIN: No diaphoresis rash HEENT: Unremarkable RESPIRATORY: Breathing is even, unlabored. Lung sounds are clear   CARDIOVASCULAR: Heart RRR no murmurs, rubs or gallops. No peripheral edema  GASTROINTESTINAL: Abdomen is soft, non-tender, not distended w/ normal bowel sounds.  GENITOURINARY: Bladder non tender, not distended  MUSCULOSKELETAL: wasting and contractures NEUROLOGIC: Cranial nerves 2-12 grossly intact, some movement of extremities; echolalia PSYCHIATRIC: Mood and affect appropriate to situation, no behavioral issues  Patient Active Problem List   Diagnosis Date Noted  . Skin ulcers of both feet ([place]) 10/23/2015  . Vitamin B12 deficiency 10/23/2015  . FTT (failure to thrive) in adult 09/01/2015  . Lewy body dementia 09/01/2015  . Corn or callus 02/06/2015  . Dizziness 02/06/2015  . Neuropathy ([place]) 02/06/2015  . Secondary parkinsonism ([place])   . Depression   . Anxiety state   . Essential hypertension   . HLD (hyperlipidemia)   . Nonorganic psychosis   . Atrial fibrillation ([place]) 01/12/2015  . Sepsis ([place])  01/12/2015  . Ischemic stroke ([place]) 01/12/2015  . Acute encephalopathy   . Paroxysmal atrial fibrillation (HCC)   . Schizophrenia ([place])   . Atrial fibrillation with RVR ([place]) 01/11/2015  . SVT (supraventricular tachycardia) ([place]) 01/11/2015  . Alzheimer disease   . Diabetes mellitus without complication ([place])   . HOCM (hypertrophic obstructive cardiomyopathy) ([place])   . HTN (hypertension) 01/07/2015  . Edema 01/06/2015  . Blister of skin without infection 01/05/2015  . Parkinsonian features 12/25/2014  . Weakness 12/18/2014  . Altered mental status 12/11/2014  . UTI (urinary tract infection) 12/02/2014  . GERD (gastroesophageal reflux disease) 12/02/2014  . Psychosis 12/02/2014  . Renal failure (ARF), acute on chronic (HCC) 11/24/2014  . MDD (major depressive disorder) ([place]) 11/24/2014  . Pressure ulcer 11/24/2014  . Elevated troponin 11/23/2014  . Difficulty hearing 11/07/2014  . Alzheimer's dementia with behavioral disturbance 11/03/2014  . Severe episode of recurrent major  depressive disorder, without psychotic features ([place]) 11/02/2014  . Chronic kidney disease (CKD), stage III (moderate) 09/17/2014  . Rotator cuff arthropathy 07/21/2014  . Diabetic polyneuropathy ([place]) 05/21/2014  . Acid reflux 05/21/2014  . Aortic heart valve narrowing 02/24/2014  . Acquired deformities of toe 07/12/2013  . Hypertrophic obstructive cardiomyopathy ([place]) 07/04/2013  . Left ventricular outflow obstruction 05/31/2012  . Left ventricular hypertrophy 05/07/2011  . Sick sinus syndrome ([place]) 05/07/2011  . Cardiac conduction disorder 05/01/2011  . Diabetes mellitus ([place]) 05/01/2011  . Degeneration of intervertebral disc of lumbosacral region 01/27/2011  . Chronic kidney disease 12/16/2010  . Absence of bladder continence 12/16/2010  . Diverticulitis 06/03/2010  . Fibromyalgia 06/03/2010  . Arthritis, degenerative 06/03/2010  . Adenomatous colon polyp 08/03/2006    CBC    Component Value  Date/Time   WBC 9.4 01/14/2015 0154   RBC 3.92 01/14/2015 0154   HGB 11.5 (L) 01/14/2015 0154   HCT 35.6 (L) 01/14/2015 0154   PLT 278 01/14/2015 0154   MCV 90.8 01/14/2015 0154   LYMPHSABS 1.4 01/14/2015 0154   MONOABS 0.7 01/14/2015 0154   EOSABS 0.1 01/14/2015 0154   BASOSABS 0.0 01/14/2015 0154    CMP     Component Value Date/Time   NA 138 01/15/2015 0518   K 4.0 01/15/2015 0518   CL 113 (H) 01/15/2015 0518   CO2 18 (L) 01/15/2015 0518   GLUCOSE 139 (H) 01/15/2015 0518   BUN 7 01/15/2015 0518   CREATININE 0.69 01/15/2015 0518   CALCIUM 8.9 01/15/2015 0518   PROT 4.9 (L) 01/14/2015 0154   ALBUMIN 2.0 (L) 01/14/2015 0154   AST 25 01/14/2015 0154   ALT 31 01/14/2015 0154   ALKPHOS 93 01/14/2015 0154   BILITOT 1.1 01/14/2015 0154   GFRNONAA >60 01/15/2015 0518   GFRAA >60 01/15/2015 0518    Assessment and Plan  Pressure ulcer Wounds of B heels, L second toe, sacrum and ischium- all healed  Vitamin B12 deficiency Resolved; B12 was 902, supplement is d/c  Diabetes mellitus ([place]) Pt is Hospice, blood draws decreased, last A1c 6.1, appetite poor, recent BS were fine   Cedar Ditullio D. Sheppard Coil, MD

## 2015-10-09 DIAGNOSIS — L8990 Pressure ulcer of unspecified site, unspecified stage: Secondary | ICD-10-CM | POA: Diagnosis not present

## 2015-10-09 DIAGNOSIS — G3183 Dementia with Lewy bodies: Secondary | ICD-10-CM | POA: Diagnosis not present

## 2015-10-09 DIAGNOSIS — I429 Cardiomyopathy, unspecified: Secondary | ICD-10-CM | POA: Diagnosis not present

## 2015-10-09 DIAGNOSIS — R630 Anorexia: Secondary | ICD-10-CM | POA: Diagnosis not present

## 2015-10-09 DIAGNOSIS — I10 Essential (primary) hypertension: Secondary | ICD-10-CM | POA: Diagnosis not present

## 2015-10-09 DIAGNOSIS — N189 Chronic kidney disease, unspecified: Secondary | ICD-10-CM | POA: Diagnosis not present

## 2015-10-09 DIAGNOSIS — R634 Abnormal weight loss: Secondary | ICD-10-CM | POA: Diagnosis not present

## 2015-10-09 DIAGNOSIS — R1310 Dysphagia, unspecified: Secondary | ICD-10-CM | POA: Diagnosis not present

## 2015-10-09 DIAGNOSIS — I679 Cerebrovascular disease, unspecified: Secondary | ICD-10-CM | POA: Diagnosis not present

## 2015-10-09 DIAGNOSIS — I359 Nonrheumatic aortic valve disorder, unspecified: Secondary | ICD-10-CM | POA: Diagnosis not present

## 2015-10-09 DIAGNOSIS — F319 Bipolar disorder, unspecified: Secondary | ICD-10-CM | POA: Diagnosis not present

## 2015-10-09 DIAGNOSIS — E1159 Type 2 diabetes mellitus with other circulatory complications: Secondary | ICD-10-CM | POA: Diagnosis not present

## 2015-10-09 DIAGNOSIS — I4891 Unspecified atrial fibrillation: Secondary | ICD-10-CM | POA: Diagnosis not present

## 2015-10-09 DIAGNOSIS — K219 Gastro-esophageal reflux disease without esophagitis: Secondary | ICD-10-CM | POA: Diagnosis not present

## 2015-10-09 DIAGNOSIS — G9009 Other idiopathic peripheral autonomic neuropathy: Secondary | ICD-10-CM | POA: Diagnosis not present

## 2015-10-09 DIAGNOSIS — E785 Hyperlipidemia, unspecified: Secondary | ICD-10-CM | POA: Diagnosis not present

## 2015-10-09 DIAGNOSIS — J309 Allergic rhinitis, unspecified: Secondary | ICD-10-CM | POA: Diagnosis not present

## 2015-10-09 DIAGNOSIS — M2450 Contracture, unspecified joint: Secondary | ICD-10-CM | POA: Diagnosis not present

## 2015-10-23 ENCOUNTER — Encounter: Payer: Self-pay | Admitting: Internal Medicine

## 2015-10-23 ENCOUNTER — Non-Acute Institutional Stay (SKILLED_NURSING_FACILITY): Payer: Medicare Other | Admitting: Internal Medicine

## 2015-10-23 DIAGNOSIS — L97519 Non-pressure chronic ulcer of other part of right foot with unspecified severity: Secondary | ICD-10-CM | POA: Insufficient documentation

## 2015-10-23 DIAGNOSIS — I421 Obstructive hypertrophic cardiomyopathy: Secondary | ICD-10-CM

## 2015-10-23 DIAGNOSIS — K219 Gastro-esophageal reflux disease without esophagitis: Secondary | ICD-10-CM | POA: Diagnosis not present

## 2015-10-23 DIAGNOSIS — L97529 Non-pressure chronic ulcer of other part of left foot with unspecified severity: Secondary | ICD-10-CM

## 2015-10-23 DIAGNOSIS — I10 Essential (primary) hypertension: Secondary | ICD-10-CM | POA: Diagnosis not present

## 2015-10-23 DIAGNOSIS — E538 Deficiency of other specified B group vitamins: Secondary | ICD-10-CM | POA: Insufficient documentation

## 2015-10-23 NOTE — Assessment & Plan Note (Signed)
Reasonably controlled; cont lopressor 75 mg BID and norvasc 5 mg daily

## 2015-10-23 NOTE — Progress Notes (Signed)
MRN: [id number] Name: Emma Mcguire  Sex: female Age: 82 y.o. DOB: 1928-02-06  [place] #:  Facility/Room: [facility] / 202 D Level Of Care: SNF Provider: Noah Delaine. Sheppard Coil, MD Emergency Contacts: Extended Emergency Contact Information Primary Emergency Contact: Gullatt,James Address: [address] [place], [place] [address] [place] Phone: [phone] Relation: Son Secondary Emergency Contact: Rudell Cobb [place] Phone: [phone] Relation: Other  Code Status: DNR  Allergies: Cymbalta [duloxetine hcl]; Lipitor [atorvastatin]; Metformin and related; Simvastatin; and Verapamil  Chief Complaint  Patient presents with  . Medical Management of Chronic Issues    Routine Visit    HPI: Patient is 82 y.o. female who is being seen for routine issues of HTN, ischemic cardiomyopathy anf GERD.  Past Medical History:  Diagnosis Date  . Alzheimer disease   . Atrial fibrillation ([place])   . Chronic kidney disease   . Colon polyp   . Diabetes mellitus without complication ([place])   . Fibromyalgia   . GERD (gastroesophageal reflux disease)   . HOCM (hypertrophic obstructive cardiomyopathy) ([place])   . Hypertension   . Neuropathy [facility])     Past Surgical History:  Procedure Laterality Date  . SHOULDER SURGERY Left    rotator cuff      Medication List       Accurate as of 10/23/15  8:02 PM. Always use your most recent med list.          amLODipine 5 MG tablet Commonly known as:  NORVASC Take 5 mg by mouth daily.   aspirin 81 MG chewable tablet Chew 4 tablets (324 mg total) by mouth daily.   ATIVAN 0.5 MG tablet Generic drug:  LORazepam Take 0.25 mg by mouth every 8 (eight) hours as needed for anxiety.   atorvastatin 10 MG tablet Commonly known as:  LIPITOR Take 1 tablet (10 mg total) by mouth daily at 6 PM.   divalproex 125 MG capsule Commonly known as:  DEPAKOTE SPRINKLE Take 2 capsules (250mg ) by mouth each  morning and take 3 capsules (375 mg) by mouth every evening.   docusate sodium 100 MG capsule Commonly known as:  COLACE Take 1 capsule (100 mg total) by mouth daily.   escitalopram 5 MG tablet Commonly known as:  LEXAPRO Take 10 mg by mouth daily.   feeding supplement (ENSURE ENLIVE) Liqd Take 237 mLs by mouth 4 (four) times daily - after meals and at bedtime.   feeding supplement (PRO-STAT SUGAR FREE 64) Liqd Take 30 mLs by mouth 2 (two) times daily between meals.   fluticasone 50 MCG/ACT nasal spray Commonly known as:  FLONASE Place 2 sprays into both nostrils daily.   HYDROcodone-acetaminophen 5-325 MG tablet Commonly known as:  NORCO/VICODIN Take 1 tablet by mouth every 4 (four) hours as needed for moderate pain.   loratadine 10 MG tablet Commonly known as:  CLARITIN Take 10 mg by mouth daily as needed for allergies.   metoprolol tartrate 25 mg/10 mL Susp Commonly known as:  LOPRESSOR Give 15 mls (75 mg) by mouth twice daily   omeprazole 40 MG capsule Commonly known as:  PRILOSEC Take 40 mg by mouth daily.   sennosides-docusate sodium 8.6-50 MG tablet Commonly known as:  SENOKOT-S Take 1 tablet by mouth at bedtime.   THERA Tabs Take 1 tablet by mouth daily.   UNABLE TO FIND Med Name: Med Pass initiate 4 oz  three times daily  No orders of the defined types were placed in this encounter.   Immunization History  Administered Date(s) Administered  . PPD Test 06/08/2015, 06/25/2015    Social History  Substance Use Topics  . Smoking status: Never Smoker  . Smokeless tobacco: Not on file  . Alcohol use No    Review of Systems  DATA OBTAINED: from patient, nurse; pt can not fully participate GENERAL:  no fevers, fatigue, appetite changes SKIN: No itching, rash HEENT: No complaint RESPIRATORY: No cough, wheezing, SOB CARDIAC: No chest pain, palpitations, lower extremity edema  GI: No abdominal pain, No N/V/D or constipation, No heartburn or  reflux  GU: No dysuria, frequency or urgency, or incontinence  MUSCULOSKELETAL: No unrelieved bone/joint pain NEUROLOGIC: No headache, dizziness  PSYCHIATRIC: No overt anxiety or sadness  Vitals:   10/23/15 0840  BP: (!) 149/73  Pulse: 88  Resp: 18  Temp: 97 F (36.1 C)    Physical Exam  GENERAL APPEARANCE: Alert, conversant, No acute distress  SKIN: No diaphoresis rash HEENT: Unremarkable RESPIRATORY: Breathing is even, unlabored. Lung sounds are clear   CARDIOVASCULAR: Heart RRR no murmurs, rubs or gallops. No peripheral edema  GASTROINTESTINAL: Abdomen is soft, non-tender, not distended w/ normal bowel sounds.  GENITOURINARY: Bladder non tender, not distended  MUSCULOSKELETAL: wasting and contractures all ext NEUROLOGIC: Cranial nerves 2-12 grossly intact. Moves all extremities minimally; echolalia PSYCHIATRIC: Mood and affect appropriate to situation, no behavioral issues  Patient Active Problem List   Diagnosis Date Noted  . Skin ulcers of both feet ([place]) 10/23/2015  . Vitamin B12 deficiency 10/23/2015  . FTT (failure to thrive) in adult 09/01/2015  . Lewy body dementia 09/01/2015  . Corn or callus 02/06/2015  . Dizziness 02/06/2015  . Neuropathy ([place]) 02/06/2015  . Secondary parkinsonism ([place])   . Depression   . Anxiety state   . Essential hypertension   . HLD (hyperlipidemia)   . Nonorganic psychosis   . Atrial fibrillation ([place]) 01/12/2015  . Sepsis ([place]) 01/12/2015  . Ischemic stroke ([place]) 01/12/2015  . Acute encephalopathy   . Paroxysmal atrial fibrillation (HCC)   . Schizophrenia ([place])   . Atrial fibrillation with RVR ([place]) 01/11/2015  . SVT (supraventricular tachycardia) ([place]) 01/11/2015  . Alzheimer disease   . Diabetes mellitus without complication ([place])   . HOCM (hypertrophic obstructive cardiomyopathy) ([place])   . HTN (hypertension) 01/07/2015  . Edema 01/06/2015  . Blister of skin without infection 01/05/2015  . Parkinsonian features 12/25/2014   . Weakness 12/18/2014  . Altered mental status 12/11/2014  . UTI (urinary tract infection) 12/02/2014  . GERD (gastroesophageal reflux disease) 12/02/2014  . Psychosis 12/02/2014  . Renal failure (ARF), acute on chronic (HCC) 11/24/2014  . MDD (major depressive disorder) ([place]) 11/24/2014  . Pressure ulcer 11/24/2014  . Elevated troponin 11/23/2014  . Difficulty hearing 11/07/2014  . Alzheimer's dementia with behavioral disturbance 11/03/2014  . Severe episode of recurrent major depressive disorder, without psychotic features ([place]) 11/02/2014  . Chronic kidney disease (CKD), stage III (moderate) 09/17/2014  . Rotator cuff arthropathy 07/21/2014  . Diabetic polyneuropathy ([place]) 05/21/2014  . Acid reflux 05/21/2014  . Aortic heart valve narrowing 02/24/2014  . Acquired deformities of toe 07/12/2013  . Hypertrophic obstructive cardiomyopathy ([place]) 07/04/2013  . Left ventricular outflow obstruction 05/31/2012  . Left ventricular hypertrophy 05/07/2011  . Sick sinus syndrome ([place]) 05/07/2011  . Cardiac conduction disorder 05/01/2011  . Diabetes mellitus ([place]) 05/01/2011  . Degeneration of intervertebral disc of lumbosacral region 01/27/2011  .  Chronic kidney disease 12/16/2010  . Absence of bladder continence 12/16/2010  . Diverticulitis 06/03/2010  . Fibromyalgia 06/03/2010  . Arthritis, degenerative 06/03/2010  . Adenomatous colon polyp 08/03/2006    CBC    Component Value Date/Time   WBC 9.4 01/14/2015 0154   RBC 3.92 01/14/2015 0154   HGB 11.5 (L) 01/14/2015 0154   HCT 35.6 (L) 01/14/2015 0154   PLT 278 01/14/2015 0154   MCV 90.8 01/14/2015 0154   LYMPHSABS 1.4 01/14/2015 0154   MONOABS 0.7 01/14/2015 0154   EOSABS 0.1 01/14/2015 0154   BASOSABS 0.0 01/14/2015 0154    CMP     Component Value Date/Time   NA 138 01/15/2015 0518   K 4.0 01/15/2015 0518   CL 113 (H) 01/15/2015 0518   CO2 18 (L) 01/15/2015 0518   GLUCOSE 139 (H) 01/15/2015 0518   BUN 7 01/15/2015  0518   CREATININE 0.69 01/15/2015 0518   CALCIUM 8.9 01/15/2015 0518   PROT 4.9 (L) 01/14/2015 0154   ALBUMIN 2.0 (L) 01/14/2015 0154   AST 25 01/14/2015 0154   ALT 31 01/14/2015 0154   ALKPHOS 93 01/14/2015 0154   BILITOT 1.1 01/14/2015 0154   GFRNONAA >60 01/15/2015 0518   GFRAA >60 01/15/2015 0518    Assessment and Plan  HTN (hypertension) Reasonably controlled; cont lopressor 75 mg BID and norvasc 5 mg daily  Hypertrophic obstructive cardiomyopathy (HCC) Chronic and stable; cont metoprolol 75 mg BID; pt has not needed lasix  GERD (gastroesophageal reflux disease) No signs or sx of reflux; cont prilosec 40 mg daily   Hendricks Schwandt D. Sheppard Coil, MD

## 2015-10-23 NOTE — Assessment & Plan Note (Signed)
Wounds of B heels, L second toe, sacrum and ischium- all healed

## 2015-10-23 NOTE — Assessment & Plan Note (Signed)
Resolved; B12 was 902, supplement is d/c

## 2015-10-23 NOTE — Assessment & Plan Note (Signed)
Chronic and stable; cont metoprolol 75 mg BID; pt has not needed lasix

## 2015-10-23 NOTE — Assessment & Plan Note (Signed)
No signs or sx of reflux; cont prilosec 40 mg daily

## 2015-10-23 NOTE — Assessment & Plan Note (Signed)
Pt is Hospice, blood draws decreased, last A1c 6.1, appetite poor, recent BS were fine

## 2015-11-09 DIAGNOSIS — I4891 Unspecified atrial fibrillation: Secondary | ICD-10-CM | POA: Diagnosis not present

## 2015-11-09 DIAGNOSIS — E1159 Type 2 diabetes mellitus with other circulatory complications: Secondary | ICD-10-CM | POA: Diagnosis not present

## 2015-11-09 DIAGNOSIS — I429 Cardiomyopathy, unspecified: Secondary | ICD-10-CM | POA: Diagnosis not present

## 2015-11-09 DIAGNOSIS — F319 Bipolar disorder, unspecified: Secondary | ICD-10-CM | POA: Diagnosis not present

## 2015-11-09 DIAGNOSIS — R630 Anorexia: Secondary | ICD-10-CM | POA: Diagnosis not present

## 2015-11-09 DIAGNOSIS — R1310 Dysphagia, unspecified: Secondary | ICD-10-CM | POA: Diagnosis not present

## 2015-11-09 DIAGNOSIS — G9009 Other idiopathic peripheral autonomic neuropathy: Secondary | ICD-10-CM | POA: Diagnosis not present

## 2015-11-09 DIAGNOSIS — M2450 Contracture, unspecified joint: Secondary | ICD-10-CM | POA: Diagnosis not present

## 2015-11-09 DIAGNOSIS — I10 Essential (primary) hypertension: Secondary | ICD-10-CM | POA: Diagnosis not present

## 2015-11-09 DIAGNOSIS — L8990 Pressure ulcer of unspecified site, unspecified stage: Secondary | ICD-10-CM | POA: Diagnosis not present

## 2015-11-09 DIAGNOSIS — I359 Nonrheumatic aortic valve disorder, unspecified: Secondary | ICD-10-CM | POA: Diagnosis not present

## 2015-11-09 DIAGNOSIS — G3183 Dementia with Lewy bodies: Secondary | ICD-10-CM | POA: Diagnosis not present

## 2015-11-09 DIAGNOSIS — N189 Chronic kidney disease, unspecified: Secondary | ICD-10-CM | POA: Diagnosis not present

## 2015-11-09 DIAGNOSIS — E785 Hyperlipidemia, unspecified: Secondary | ICD-10-CM | POA: Diagnosis not present

## 2015-11-09 DIAGNOSIS — R634 Abnormal weight loss: Secondary | ICD-10-CM | POA: Diagnosis not present

## 2015-11-09 DIAGNOSIS — K219 Gastro-esophageal reflux disease without esophagitis: Secondary | ICD-10-CM | POA: Diagnosis not present

## 2015-11-09 DIAGNOSIS — J309 Allergic rhinitis, unspecified: Secondary | ICD-10-CM | POA: Diagnosis not present

## 2015-11-09 DIAGNOSIS — I679 Cerebrovascular disease, unspecified: Secondary | ICD-10-CM | POA: Diagnosis not present

## 2015-11-26 ENCOUNTER — Non-Acute Institutional Stay (SKILLED_NURSING_FACILITY): Payer: Medicare Other | Admitting: Internal Medicine

## 2015-11-26 ENCOUNTER — Encounter: Payer: Self-pay | Admitting: Internal Medicine

## 2015-11-26 DIAGNOSIS — F0280 Dementia in other diseases classified elsewhere without behavioral disturbance: Secondary | ICD-10-CM

## 2015-11-26 DIAGNOSIS — I480 Paroxysmal atrial fibrillation: Secondary | ICD-10-CM | POA: Diagnosis not present

## 2015-11-26 DIAGNOSIS — F329 Major depressive disorder, single episode, unspecified: Secondary | ICD-10-CM

## 2015-11-26 DIAGNOSIS — F32A Depression, unspecified: Secondary | ICD-10-CM

## 2015-11-26 DIAGNOSIS — G3183 Dementia with Lewy bodies: Secondary | ICD-10-CM | POA: Diagnosis not present

## 2015-11-26 NOTE — Progress Notes (Signed)
MRN: [id number] Name: Emma Mcguire  Sex: female Age: 82 y.o. DOB: [birth year]  [place] #:  Facility/Room: [facility] / 202 D Level Of Care: SNF Provider: Noah Delaine. Sheppard Coil, MD Emergency Contacts: Extended Emergency Contact Information Primary Emergency Contact: Bernards,James Address: [address] [place], [place] [address] [place] Phone: [phone] Relation: Son Secondary Emergency Contact: Rudell [place] Phone: [phone] Relation: Other  Code Status: DNR  Allergies: Cymbalta [duloxetine hcl]; Lipitor [atorvastatin]; Metformin and related; Simvastatin; and Verapamil  Chief Complaint  Patient presents with  . Medical Management of Chronic Issues    Routine Visit    HPI: Patient is 82 y.o. female hospice pt who is being seen for routine isues of PAF, Lewy body dementia and depression.  Past Medical History:  Diagnosis Date  . Alzheimer disease   . Atrial fibrillation ([place])   . Chronic kidney disease   . Colon polyp   . Diabetes mellitus without complication ([place])   . Fibromyalgia   . GERD (gastroesophageal reflux disease)   . HOCM (hypertrophic obstructive cardiomyopathy) ([place])   . Hypertension   . Neuropathy [facility])     Past Surgical History:  Procedure Laterality Date  . SHOULDER SURGERY Left    rotator cuff      Medication List       Accurate as of 11/26/15  2:19 PM. Always use your most recent med list.          amLODipine 5 MG tablet Commonly known as:  NORVASC Take 5 mg by mouth daily.   aspirin 81 MG chewable tablet Chew 4 tablets (324 mg total) by mouth daily.   ATIVAN 0.5 MG tablet Generic drug:  LORazepam Take 0.25 mg by mouth every 8 (eight) hours as needed for anxiety.   atorvastatin 10 MG tablet Commonly known as:  LIPITOR Take 1 tablet (10 mg total) by mouth daily at 6 PM.   divalproex 125 MG capsule Commonly known as:  DEPAKOTE SPRINKLE Take 2 capsules (250mg ) by mouth  each morning and take 3 capsules (375 mg) by mouth every evening.   docusate sodium 100 MG capsule Commonly known as:  COLACE Take 1 capsule (100 mg total) by mouth daily.   escitalopram 5 MG tablet Commonly known as:  LEXAPRO Take 10 mg by mouth daily.   feeding supplement (ENSURE ENLIVE) Liqd Take 237 mLs by mouth 4 (four) times daily - after meals and at bedtime.   feeding supplement (PRO-STAT SUGAR FREE 64) Liqd Take 30 mLs by mouth 2 (two) times daily between meals.   fluticasone 50 MCG/ACT nasal spray Commonly known as:  FLONASE Place 2 sprays into both nostrils daily.   HYDROcodone-acetaminophen 5-325 MG tablet Commonly known as:  NORCO/VICODIN Take 1 tablet by mouth every 4 (four) hours as needed for moderate pain.   loratadine 10 MG tablet Commonly known as:  CLARITIN Take 10 mg by mouth daily as needed for allergies.   metoprolol tartrate 25 mg/10 mL Susp Commonly known as:  LOPRESSOR Give 15 mls (75 mg) by mouth twice daily   omeprazole 40 MG capsule Commonly known as:  PRILOSEC Take 40 mg by mouth daily.   sennosides-docusate sodium 8.6-50 MG tablet Commonly known as:  SENOKOT-S Take 1 tablet by mouth at bedtime.   THERA VITAMIN PO Take 1 tablet by mouth daily.   UNABLE TO FIND Med Name: Med Pass initiate 4 oz  three times daily       Meds ordered this encounter  Medications  . Multiple Vitamin (THERA VITAMIN PO)    Sig: Take 1 tablet by mouth daily.    Immunization History  Administered Date(s) Administered  . PPD Test 06/08/2015, 06/25/2015    Social History  Substance Use Topics  . Smoking status: Never Smoker  . Smokeless tobacco: Not on file  . Alcohol use No    Review of Systems  UTO 2/2 dementia    Vitals:   11/26/15 1345  BP: (!) 149/73  Pulse: 69  Resp: 18  Temp: 97 F (36.1 C)    Physical Exam  GENERAL APPEARANCE: Alert, mod conversant, No acute distress  SKIN: No diaphoresis rash HEENT:  Unremarkable RESPIRATORY: Breathing is even, unlabored. Lung sounds are clear   CARDIOVASCULAR: Heart RRR 2/6 systolic murmur, no rubs or gallops. No peripheral edema  GASTROINTESTINAL: Abdomen is soft, non-tender, not distended w/ normal bowel sounds.  GENITOURINARY: Bladder non tender, not distended  MUSCULOSKELETAL: some wasting and contractures. NEUROLOGIC: Cranial nerves 2-12 grossly intact. Moves all extremities- sort of a functional quadriplegia PSYCHIATRIC; dementia, but pt can answer simple questions appropriately, no behavioral issues  Patient Active Problem List   Diagnosis Date Noted  . Skin ulcers of both feet ([place]) 10/23/2015  . Vitamin B12 deficiency 10/23/2015  . FTT (failure to thrive) in adult 09/01/2015  . Lewy body dementia 09/01/2015  . Corn or callus 02/06/2015  . Dizziness 02/06/2015  . Neuropathy ([place]) 02/06/2015  . Secondary parkinsonism ([place])   . Depression   . Anxiety state   . Essential hypertension   . HLD (hyperlipidemia)   . Nonorganic psychosis   . Atrial fibrillation ([place]) 01/12/2015  . Sepsis ([place]) 01/12/2015  . Ischemic stroke ([place]) 01/12/2015  . Acute encephalopathy   . Paroxysmal atrial fibrillation (HCC)   . Schizophrenia ([place])   . Atrial fibrillation with RVR ([place]) 01/11/2015  . SVT (supraventricular tachycardia) ([place]) 01/11/2015  . Alzheimer disease   . Diabetes mellitus without complication ([place])   . HOCM (hypertrophic obstructive cardiomyopathy) ([place])   . HTN (hypertension) 01/07/2015  . Edema 01/06/2015  . Blister of skin without infection 01/05/2015  . Parkinsonian features 12/25/2014  . Weakness 12/18/2014  . Altered mental status 12/11/2014  . UTI (urinary tract infection) 12/02/2014  . GERD (gastroesophageal reflux disease) 12/02/2014  . Psychosis 12/02/2014  . Renal failure (ARF), acute on chronic (HCC) 11/24/2014  . MDD (major depressive disorder) ([place]) 11/24/2014  . Pressure ulcer 11/24/2014  . Elevated troponin  11/23/2014  . Difficulty hearing 11/07/2014  . Alzheimer's dementia with behavioral disturbance 11/03/2014  . Severe episode of recurrent major depressive disorder, without psychotic features ([place]) 11/02/2014  . Chronic kidney disease (CKD), stage III (moderate) 09/17/2014  . Rotator cuff arthropathy 07/21/2014  . Diabetic polyneuropathy ([place]) 05/21/2014  . Acid reflux 05/21/2014  . Aortic heart valve narrowing 02/24/2014  . Acquired deformities of toe 07/12/2013  . Hypertrophic obstructive cardiomyopathy ([place]) 07/04/2013  . Left ventricular outflow obstruction 05/31/2012  . Left ventricular hypertrophy 05/07/2011  . Sick sinus syndrome ([place]) 05/07/2011  . Cardiac conduction disorder 05/01/2011  . Diabetes mellitus ([employer]) 05/01/2011  . Degeneration of intervertebral disc of lumbosacral region 01/27/2011  . Chronic kidney disease 12/16/2010  . Absence of bladder continence 12/16/2010  . Diverticulitis 06/03/2010  . Fibromyalgia 06/03/2010  . Arthritis, degenerative 06/03/2010  . Adenomatous colon polyp 08/03/2006    CBC    Component Value Date/Time  WBC 9.4 01/14/2015 0154   RBC 3.92 01/14/2015 0154   HGB 11.5 (L) 01/14/2015 0154   HCT 35.6 (L) 01/14/2015 0154   PLT 278 01/14/2015 0154   MCV 90.8 01/14/2015 0154   LYMPHSABS 1.4 01/14/2015 0154   MONOABS 0.7 01/14/2015 0154   EOSABS 0.1 01/14/2015 0154   BASOSABS 0.0 01/14/2015 0154    CMP     Component Value Date/Time   NA 138 01/15/2015 0518   K 4.0 01/15/2015 0518   CL 113 (H) 01/15/2015 0518   CO2 18 (L) 01/15/2015 0518   GLUCOSE 139 (H) 01/15/2015 0518   BUN 7 01/15/2015 0518   CREATININE 0.69 01/15/2015 0518   CALCIUM 8.9 01/15/2015 0518   PROT 4.9 (L) 01/14/2015 0154   ALBUMIN 2.0 (L) 01/14/2015 0154   AST 25 01/14/2015 0154   ALT 31 01/14/2015 0154   ALKPHOS 93 01/14/2015 0154   BILITOT 1.1 01/14/2015 0154   GFRNONAA >60 01/15/2015 0518   GFRAA >60 01/15/2015 0518    Assessment and Plan  Paroxysmal  atrial fibrillation (HCC) In NSR, rate controlled with metoprolol 75 mg BID and prophylaxed with 4 ASA 81 mg daily DAILY  Lewy body dementia Slow downhill progression; pt seems content; plan to cont depakote 250 mg a qm and 375 mg qHS; will monitor behavoir  Depression Appears stable;plan to cont lexapro 10 mg daily   Anne D. Sheppard Coil, MD

## 2015-11-26 NOTE — Assessment & Plan Note (Signed)
Slow downhill progression; pt seems content; plan to cont depakote 250 mg a qm and 375 mg qHS; will monitor behavoir

## 2015-11-26 NOTE — Assessment & Plan Note (Signed)
Appears stable;plan to cont lexapro 10 mg daily

## 2015-11-26 NOTE — Assessment & Plan Note (Signed)
In NSR, rate controlled with metoprolol 75 mg BID and prophylaxed with 4 ASA 81 mg daily DAILY

## 2015-11-28 ENCOUNTER — Non-Acute Institutional Stay (SKILLED_NURSING_FACILITY): Payer: Medicare Other | Admitting: Internal Medicine

## 2015-11-28 ENCOUNTER — Encounter: Payer: Self-pay | Admitting: Internal Medicine

## 2015-11-28 DIAGNOSIS — H109 Unspecified conjunctivitis: Secondary | ICD-10-CM | POA: Diagnosis not present

## 2015-11-28 NOTE — Progress Notes (Signed)
MRN: [id number] Name: Emma Mcguire  Sex: female Age: 82 y.o. DOB: [birth year]  [employer] #:  Facility/Room: [facility] / 202 D Level Of Care: SNF Provider: Noah Delaine. Sheppard Coil, MD Emergency Contacts: Extended Emergency Contact Information Primary Emergency Contact: Aguallo,James Address: [address] [place], [place] [address] [place] Phone: [phone] Relation: Son Secondary Emergency Contact: Rudell Cobb [place] Phone: [phone] Relation: Other  Code Status: DNR  Allergies: Cymbalta [duloxetine hcl]; Lipitor [atorvastatin]; Metformin and related; Simvastatin; and Verapamil  Chief Complaint  Patient presents with  . Acute Visit    Acute    HPI: Patient is 82 y.o. female who nursing asked me to see because her L eye is red with some drainage, onset today. Pt admits some pain ( she can answer simple questions). No fever, allergy  or cold sx per nursing.  Past Medical History:  Diagnosis Date  . Alzheimer disease   . Atrial fibrillation ([place])   . Chronic kidney disease   . Colon polyp   . Diabetes mellitus without complication ([place])   . Fibromyalgia   . GERD (gastroesophageal reflux disease)   . HOCM (hypertrophic obstructive cardiomyopathy) ([place])   . Hypertension   . Neuropathy [facility])     Past Surgical History:  Procedure Laterality Date  . SHOULDER SURGERY Left    rotator cuff      Medication List       Accurate as of 11/28/15  3:39 PM. Always use your most recent med list.          amLODipine 5 MG tablet Commonly known as:  NORVASC Take 5 mg by mouth daily.   aspirin 81 MG chewable tablet Chew 4 tablets (324 mg total) by mouth daily.   ATIVAN 0.5 MG tablet Generic drug:  LORazepam Take 0.25 mg by mouth every 8 (eight) hours as needed for anxiety.   atorvastatin 10 MG tablet Commonly known as:  LIPITOR Take 1 tablet (10 mg total) by mouth daily at 6 PM.   divalproex 125 MG capsule Commonly  known as:  DEPAKOTE SPRINKLE Take 2 capsules (250mg ) by mouth each morning and take 3 capsules (375 mg) by mouth every evening.   docusate sodium 100 MG capsule Commonly known as:  COLACE Take 1 capsule (100 mg total) by mouth daily.   escitalopram 5 MG tablet Commonly known as:  LEXAPRO Take 10 mg by mouth daily.   feeding supplement (ENSURE ENLIVE) Liqd Take 237 mLs by mouth 4 (four) times daily - after meals and at bedtime.   feeding supplement (PRO-STAT SUGAR FREE 64) Liqd Take 30 mLs by mouth 2 (two) times daily between meals.   fluticasone 50 MCG/ACT nasal spray Commonly known as:  FLONASE Place 2 sprays into both nostrils daily.   HYDROcodone-acetaminophen 5-325 MG tablet Commonly known as:  NORCO/VICODIN Take 1 tablet by mouth every 4 (four) hours as needed for moderate pain.   loratadine 10 MG tablet Commonly known as:  CLARITIN Take 10 mg by mouth daily as needed for allergies.   metoprolol tartrate 25 mg/10 mL Susp Commonly known as:  LOPRESSOR Give 15 mls (75 mg) by mouth twice daily   omeprazole 40 MG capsule Commonly known as:  PRILOSEC Take 40 mg by mouth daily.   sennosides-docusate sodium 8.6-50 MG tablet Commonly known as:  SENOKOT-S Take 1 tablet by mouth at bedtime.   THERA VITAMIN PO Take 1 tablet  by mouth daily.   UNABLE TO FIND Med Name: Med Pass initiate 4 oz  three times daily       No orders of the defined types were placed in this encounter.   Immunization History  Administered Date(s) Administered  . PPD Test 06/08/2015, 06/25/2015    Social History  Substance Use Topics  . Smoking status: Never Smoker  . Smokeless tobacco: Not on file  . Alcohol use No    Review of Systems  As per HPI-pt can not fully participate    Vitals:   11/28/15 1535  BP: (!) 146/78  Pulse: 69  Resp: 16  Temp: 98.3 F (36.8 C)    Physical Exam  GENERAL APPEARANCE: Alert, min conversant, No acute distress  SKIN: No diaphoresis  rash HEENT: L conjunctiva with injection and redness; no swelling to lids RESPIRATORY: Breathing is even, unlabored. Lung sounds are clear   CARDIOVASCULAR: Heart RRR no murmurs, rubs or gallops. No peripheral edema  GASTROINTESTINAL: Abdomen is soft, non-tender, not distended w/ normal bowel sounds.  GENITOURINARY: Bladder non tender, not distended  MUSCULOSKELETAL: No abnormal joints or musculature NEUROLOGIC: Cranial nerves 2-12 grossly intact. Moves all extremities minimally PSYCHIATRIC: dementia, no behavioral issues  Patient Active Problem List   Diagnosis Date Noted  . Skin ulcers of both feet ([place]) 10/23/2015  . Vitamin B12 deficiency 10/23/2015  . FTT (failure to thrive) in adult 09/01/2015  . Lewy body dementia 09/01/2015  . Corn or callus 02/06/2015  . Dizziness 02/06/2015  . Neuropathy ([place]) 02/06/2015  . Secondary parkinsonism ([place])   . Depression   . Anxiety state   . Essential hypertension   . HLD (hyperlipidemia)   . Nonorganic psychosis   . Atrial fibrillation ([place]) 01/12/2015  . Sepsis ([place]) 01/12/2015  . Ischemic stroke ([place]) 01/12/2015  . Acute encephalopathy   . Paroxysmal atrial fibrillation ([facility])   . Schizophrenia ([place])   . Atrial fibrillation with RVR ([place]) 01/11/2015  . SVT (supraventricular tachycardia) ([place]) 01/11/2015  . Alzheimer disease   . Diabetes mellitus without complication ([place])   . HOCM (hypertrophic obstructive cardiomyopathy) ([place])   . HTN (hypertension) 01/07/2015  . Edema 01/06/2015  . Blister of skin without infection 01/05/2015  . Parkinsonian features 12/25/2014  . Weakness 12/18/2014  . Altered mental status 12/11/2014  . UTI (urinary tract infection) 12/02/2014  . GERD (gastroesophageal reflux disease) 12/02/2014  . Psychosis 12/02/2014  . Renal failure (ARF), acute on chronic (H[facility]) 11/24/2014  . MDD (major depressive disorder) ([place]) 11/24/2014  . Pressure ulcer 11/24/2014  . Elevated troponin 11/23/2014  . Difficulty  hearing 11/07/2014  . Alzheimer's dementia with behavioral disturbance 11/03/2014  . Severe episode of recurrent major depressive disorder, without psychotic features ([place]) 11/02/2014  . Chronic kidney disease (CKD), stage III (moderate) 09/17/2014  . Rotator cuff arthropathy 07/21/2014  . Diabetic polyneuropathy ([place]) 05/21/2014  . Acid reflux 05/21/2014  . Aortic heart valve narrowing 02/24/2014  . Acquired deformities of toe 07/12/2013  . Hypertrophic obstructive cardiomyopathy ([place]) 07/04/2013  . Left ventricular outflow obstruction 05/31/2012  . Left ventricular hypertrophy 05/07/2011  . Sick sinus syndrome ([place]) 05/07/2011  . Cardiac conduction disorder 05/01/2011  . Diabetes mellitus ([place]) 05/01/2011  . Degeneration of intervertebral disc of lumbosacral region 01/27/2011  . Chronic kidney disease 12/16/2010  . Absence of bladder continence 12/16/2010  . Diverticulitis 06/03/2010  . Fibromyalgia 06/03/2010  . Arthritis, degenerative 06/03/2010  . Adenomatous colon polyp 08/03/2006    CBC    Component  Value Date/Time   WBC 9.4 01/14/2015 0154   RBC 3.92 01/14/2015 0154   HGB 11.5 (L) 01/14/2015 0154   HCT 35.6 (L) 01/14/2015 0154   PLT 278 01/14/2015 0154   MCV 90.8 01/14/2015 0154   LYMPHSABS 1.4 01/14/2015 0154   MONOABS 0.7 01/14/2015 0154   EOSABS 0.1 01/14/2015 0154   BASOSABS 0.0 01/14/2015 0154    CMP     Component Value Date/Time   NA 138 01/15/2015 0518   K 4.0 01/15/2015 0518   CL 113 (H) 01/15/2015 0518   CO2 18 (L) 01/15/2015 0518   GLUCOSE 139 (H) 01/15/2015 0518   BUN 7 01/15/2015 0518   CREATININE 0.69 01/15/2015 0518   CALCIUM 8.9 01/15/2015 0518   PROT 4.9 (L) 01/14/2015 0154   ALBUMIN 2.0 (L) 01/14/2015 0154   AST 25 01/14/2015 0154   ALT 31 01/14/2015 0154   ALKPHOS 93 01/14/2015 0154   BILITOT 1.1 01/14/2015 0154   GFRNONAA >60 01/15/2015 0518   GFRAA >60 01/15/2015 0518    Assessment and Plan  CONJUNCTIVITIS- garamycin drops to B  eyes QID for 10 days    Noah Delaine. Sheppard Coil, MD

## 2015-12-09 DIAGNOSIS — M2450 Contracture, unspecified joint: Secondary | ICD-10-CM | POA: Diagnosis not present

## 2015-12-09 DIAGNOSIS — R630 Anorexia: Secondary | ICD-10-CM | POA: Diagnosis not present

## 2015-12-09 DIAGNOSIS — I4891 Unspecified atrial fibrillation: Secondary | ICD-10-CM | POA: Diagnosis not present

## 2015-12-09 DIAGNOSIS — K219 Gastro-esophageal reflux disease without esophagitis: Secondary | ICD-10-CM | POA: Diagnosis not present

## 2015-12-09 DIAGNOSIS — R634 Abnormal weight loss: Secondary | ICD-10-CM | POA: Diagnosis not present

## 2015-12-09 DIAGNOSIS — R1310 Dysphagia, unspecified: Secondary | ICD-10-CM | POA: Diagnosis not present

## 2015-12-09 DIAGNOSIS — E1159 Type 2 diabetes mellitus with other circulatory complications: Secondary | ICD-10-CM | POA: Diagnosis not present

## 2015-12-09 DIAGNOSIS — I359 Nonrheumatic aortic valve disorder, unspecified: Secondary | ICD-10-CM | POA: Diagnosis not present

## 2015-12-09 DIAGNOSIS — E785 Hyperlipidemia, unspecified: Secondary | ICD-10-CM | POA: Diagnosis not present

## 2015-12-09 DIAGNOSIS — I679 Cerebrovascular disease, unspecified: Secondary | ICD-10-CM | POA: Diagnosis not present

## 2015-12-09 DIAGNOSIS — G9009 Other idiopathic peripheral autonomic neuropathy: Secondary | ICD-10-CM | POA: Diagnosis not present

## 2015-12-09 DIAGNOSIS — J309 Allergic rhinitis, unspecified: Secondary | ICD-10-CM | POA: Diagnosis not present

## 2015-12-09 DIAGNOSIS — L8990 Pressure ulcer of unspecified site, unspecified stage: Secondary | ICD-10-CM | POA: Diagnosis not present

## 2015-12-09 DIAGNOSIS — I10 Essential (primary) hypertension: Secondary | ICD-10-CM | POA: Diagnosis not present

## 2015-12-09 DIAGNOSIS — F319 Bipolar disorder, unspecified: Secondary | ICD-10-CM | POA: Diagnosis not present

## 2015-12-09 DIAGNOSIS — G3183 Dementia with Lewy bodies: Secondary | ICD-10-CM | POA: Diagnosis not present

## 2015-12-09 DIAGNOSIS — N189 Chronic kidney disease, unspecified: Secondary | ICD-10-CM | POA: Diagnosis not present

## 2015-12-09 DIAGNOSIS — I429 Cardiomyopathy, unspecified: Secondary | ICD-10-CM | POA: Diagnosis not present

## 2015-12-24 ENCOUNTER — Non-Acute Institutional Stay (SKILLED_NURSING_FACILITY): Payer: Medicare Other | Admitting: Internal Medicine

## 2015-12-24 ENCOUNTER — Encounter: Payer: Self-pay | Admitting: Internal Medicine

## 2015-12-24 DIAGNOSIS — E785 Hyperlipidemia, unspecified: Secondary | ICD-10-CM

## 2015-12-24 DIAGNOSIS — R627 Adult failure to thrive: Secondary | ICD-10-CM

## 2015-12-24 DIAGNOSIS — I10 Essential (primary) hypertension: Secondary | ICD-10-CM

## 2015-12-24 NOTE — Progress Notes (Signed)
Location:  [facility] Room Number: 202D Place of Service:  SNF [phone])  [name], MD  Patient Care Team: Hennie Duos, MD as PCP - General (Internal Medicine)  Extended Emergency Contact Information Primary Emergency Contact: Parrella,James Address: [address] [place], [place] [address] [place] Phone: [phone] Relation: Son Secondary Emergency Contact: Rudell Cobb [place] Phone: [phone] Relation: Other    Allergies: Cymbalta [duloxetine hcl]; Lipitor [atorvastatin]; Metformin and related; Simvastatin; and Verapamil  Chief Complaint  Patient presents with  . Medical Management of Chronic Issues    Routine Visit    HPI: Patient is 82 y.o. female who with lewy body dementia who is being seen for HTN, HLD and FTT.  Past Medical History:  Diagnosis Date  . Alzheimer disease   . Atrial fibrillation ([place])   . Chronic kidney disease   . Colon polyp   . Diabetes mellitus without complication ([place])   . Fibromyalgia   . GERD (gastroesophageal reflux disease)   . HOCM (hypertrophic obstructive cardiomyopathy) ([place])   . Hypertension   . Neuropathy [facility])     Past Surgical History:  Procedure Laterality Date  . SHOULDER SURGERY Left    rotator cuff      Medication List       Accurate as of 12/24/15 11:59 PM. Always use your most recent med list.          amLODipine 5 MG tablet Commonly known as:  NORVASC Take 5 mg by mouth daily.   aspirin 81 MG chewable tablet Chew 4 tablets (324 mg total) by mouth daily.   ATIVAN 0.5 MG tablet Generic drug:  LORazepam Take 0.25 mg by mouth every 8 (eight) hours as needed for anxiety.   atorvastatin 10 MG tablet Commonly known as:  LIPITOR Take 1 tablet (10 mg total) by mouth daily at 6 PM.   divalproex 125 MG capsule Commonly known as:  DEPAKOTE SPRINKLE Take 2 capsules (250mg ) by mouth each morning and take 3  capsules (375 mg) by mouth every evening.   docusate sodium 100 MG capsule Commonly known as:  COLACE Take 1 capsule (100 mg total) by mouth daily.   escitalopram 5 MG tablet Commonly known as:  LEXAPRO Take 10 mg by mouth daily. 2 tablets   feeding supplement (ENSURE ENLIVE) Liqd Take 237 mLs by mouth 3 (three) times daily between meals.   fluticasone 50 MCG/ACT nasal spray Commonly known as:  FLONASE Place 2 sprays into both nostrils daily.   HYDROcodone-acetaminophen 5-325 MG tablet Commonly known as:  NORCO/VICODIN Take 1 tablet by mouth every 4 (four) hours as needed for moderate pain.   loratadine 10 MG tablet Commonly known as:  CLARITIN Take 10 mg by mouth daily as needed for allergies.   metoprolol tartrate 25 mg/10 mL Susp Commonly known as:  LOPRESSOR Give 15 mls (75 mg) by mouth twice daily   omeprazole 40 MG capsule Commonly known as:  PRILOSEC Take 40 mg by mouth daily.   sennosides-docusate sodium 8.6-50 MG tablet Commonly known as:  SENOKOT-S Take 1 tablet by mouth at bedtime.   THERA VITAMIN PO Take 1 tablet by mouth daily.   UNABLE TO FIND Med Name: Med Pass initiate 4 oz  three times daily       No orders of the defined types were placed in this encounter.   Immunization History  Administered Date(s) Administered  . PPD Test 06/08/2015, 06/25/2015    Social History  Substance Use Topics  . Smoking status: Never Smoker  . Smokeless tobacco: Not on file  . Alcohol use No    Review of Systems  DATA OBTAINED: from patient, nurse GENERAL:  no fevers, fatigue, appetite changes SKIN: No itching, rash HEENT: No complaint RESPIRATORY: No cough, wheezing, SOB CARDIAC: No chest pain, palpitations, lower extremity edema  GI: No abdominal pain, No N/V/D or constipation, No heartburn or reflux  GU: No dysuria, frequency or urgency, or incontinence  MUSCULOSKELETAL: No unrelieved bone/joint pain NEUROLOGIC: No headache, dizziness    PSYCHIATRIC: No overt anxiety or sadness  Vitals:   12/24/15 1045  BP: (!) 149/73  Pulse: 83  Resp: 18  Temp: 97 F (36.1 C)   Body mass index is 17.9 kg/m. Physical Exam  GENERAL APPEARANCE: Alert,  No acute distress  SKIN: No diaphoresis rash HEENT: Unremarkable RESPIRATORY: Breathing is even, unlabored. Lung sounds are clear   CARDIOVASCULAR: Heart RRR no murmurs, rubs or gallops. No peripheral edema  GASTROINTESTINAL: Abdomen is soft, non-tender, not distended w/ normal bowel sounds.  GENITOURINARY: Bladder non tender, not distended  MUSCULOSKELETAL: thin, contractures and wasting NEUROLOGIC: Cranial nerves 2-12 grossly intact; min movement of arms PSYCHIATRIC: Mood and affect appropriate to situation with dementia, no behavioral issues  Patient Active Problem List   Diagnosis Date Noted  . Skin ulcers of both feet ([place]) 10/23/2015  . Vitamin B12 deficiency 10/23/2015  . FTT (failure to thrive) in adult 09/01/2015  . Lewy body dementia 09/01/2015  . Corn or callus 02/06/2015  . Dizziness 02/06/2015  . Neuropathy ([place]) 02/06/2015  . Secondary parkinsonism ([place])   . Depression   . Anxiety state   . Essential hypertension   . HLD (hyperlipidemia)   . Nonorganic psychosis   . Atrial fibrillation ([place]) 01/12/2015  . Sepsis ([place]) 01/12/2015  . Ischemic stroke ([place]) 01/12/2015  . Acute encephalopathy   . Paroxysmal atrial fibrillation ([facility])   . Schizophrenia ([place])   . Atrial fibrillation with RVR ([place]) 01/11/2015  . SVT (supraventricular tachycardia) ([place]) 01/11/2015  . Alzheimer disease   . Diabetes mellitus without complication ([place])   . HOCM (hypertrophic obstructive cardiomyopathy) ([place])   . HTN (hypertension) 01/07/2015  . Edema 01/06/2015  . Blister of skin without infection 01/05/2015  . Parkinsonian features 12/25/2014  . Weakness 12/18/2014  . Altered mental status 12/11/2014  . UTI (urinary tract infection) 12/02/2014  . GERD (gastroesophageal reflux  disease) 12/02/2014  . Psychosis 12/02/2014  . Renal failure (ARF), acute on chronic (HCC) 11/24/2014  . MDD (major depressive disorder) 11/24/2014  . Pressure ulcer 11/24/2014  . Elevated troponin 11/23/2014  . Difficulty hearing 11/07/2014  . Alzheimer's dementia with behavioral disturbance 11/03/2014  . Severe episode of recurrent major depressive disorder, without psychotic features ([place]) 11/02/2014  . Chronic kidney disease (CKD), stage III (moderate) 09/17/2014  . Rotator cuff arthropathy 07/21/2014  . Diabetic polyneuropathy ([place]) 05/21/2014  . Acid reflux 05/21/2014  . Aortic heart valve narrowing 02/24/2014  . Acquired deformities of toe 07/12/2013  . Hypertrophic obstructive cardiomyopathy ([place]) 07/04/2013  . Left ventricular outflow obstruction 05/31/2012  . Left ventricular hypertrophy 05/07/2011  . Sick sinus syndrome ([place]) 05/07/2011  . Cardiac conduction disorder 05/01/2011  . Diabetes mellitus ([place]) 05/01/2011  . Degeneration of intervertebral disc of lumbosacral region 01/27/2011  . Chronic kidney disease 12/16/2010  . Absence of bladder continence 12/16/2010  . Diverticulitis 06/03/2010  .  Fibromyalgia 06/03/2010  . Arthritis, degenerative 06/03/2010  . Adenomatous colon polyp 08/03/2006    CMP     Component Value Date/Time   NA 138 01/15/2015 0518   K 4.0 01/15/2015 0518   CL 113 (H) 01/15/2015 0518   CO2 18 (L) 01/15/2015 0518   GLUCOSE 139 (H) 01/15/2015 0518   BUN 7 01/15/2015 0518   CREATININE 0.69 01/15/2015 0518   CALCIUM 8.9 01/15/2015 0518   PROT 4.9 (L) 01/14/2015 0154   ALBUMIN 2.0 (L) 01/14/2015 0154   AST 25 01/14/2015 0154   ALT 31 01/14/2015 0154   ALKPHOS 93 01/14/2015 0154   BILITOT 1.1 01/14/2015 0154   GFRNONAA >60 01/15/2015 0518   GFRAA >60 01/15/2015 0518    Recent Labs  01/13/15 1420 01/14/15 0154 01/15/15 0518  NA 138 140 138  K 3.0* 3.4* 4.0  CL 111 111 113*  CO2 20* 18* 18*  GLUCOSE 106* 134* 139*  BUN 9 7 7     CREATININE 0.76 0.81 0.69  CALCIUM 8.8* 8.8* 8.9  MG 1.3* 1.2* 1.6*    Recent Labs  01/12/15 0543 01/13/15 1420 01/14/15 0154  AST 39 32 25  ALT 37 35 31  ALKPHOS 96 99 93  BILITOT 1.2 0.8 1.1  PROT 5.0* 4.8* 4.9*  ALBUMIN 2.1* 2.1* 2.0*    Recent Labs  01/12/15 0543 01/13/15 1420 01/14/15 0154  WBC 10.8* 10.3 9.4  NEUTROABS  --  8.3* 7.3  HGB 12.3 11.5* 11.5*  HCT 36.0 35.4* 35.6*  MCV 89.8 90.5 90.8  PLT 257 236 278    Recent Labs  01/12/15 0543  CHOL 217*  LDLCALC 156*  TRIG 207*   No results found for: [facility] Lab Results  Component Value Date   TSH 3.421 01/12/2015   Lab Results  Component Value Date   HGBA1C 6.1 (H) 01/12/2015   Lab Results  Component Value Date   CHOL 217 (H) 01/12/2015   HDL 20 (L) 01/12/2015   LDLCALC 156 (H) 01/12/2015   TRIG 207 (H) 01/12/2015   CHOLHDL 10.9 01/12/2015    Significant Diagnostic Results in last 30 days:  No results found.  Assessment and Plan  HTN (hypertension) Controlled for her age;plan to continue metoprolol 75 mg BID and norvasc 5 mg daily  HLD (hyperlipidemia) Lipitor was supposed to be stopped but wasn't;lets try again, order has been written  FTT (failure to thrive) in adult Slowly progressive, but pt is still aware of others, answers appropriately to simple questions; cont supportive care     Webb Silversmith D. Sheppard Coil, MD

## 2015-12-25 ENCOUNTER — Non-Acute Institutional Stay (SKILLED_NURSING_FACILITY): Payer: Medicare Other | Admitting: Internal Medicine

## 2015-12-25 ENCOUNTER — Encounter: Payer: Self-pay | Admitting: Internal Medicine

## 2015-12-25 DIAGNOSIS — M898X9 Other specified disorders of bone, unspecified site: Secondary | ICD-10-CM

## 2015-12-25 DIAGNOSIS — L89151 Pressure ulcer of sacral region, stage 1: Secondary | ICD-10-CM | POA: Diagnosis not present

## 2015-12-25 NOTE — Assessment & Plan Note (Signed)
Lipitor was supposed to be stopped but wasn't;lets try again, order has been written

## 2015-12-25 NOTE — Progress Notes (Signed)
Location:  [facility] of Service:  SNF (31)[facility]  [name], MD  Patient Care Team: Hennie Duos, MD as PCP - General (Internal Medicine)  Extended Emergency Contact Information Primary Emergency Contact: Marchetta,James Address: [address] [place], [place] [address] [place] Phone: [phone] Relation: Son Secondary Emergency Contact: Rudell Cobb [place] of [place] Phone: [phone] Relation: Other    Allergies: Cymbalta [duloxetine hcl]; Lipitor [atorvastatin]; Metformin and related; Simvastatin; and Verapamil  Chief Complaint  Patient presents with  . Acute Visit    HPI: Patient is 82 y.o. female who the wound nurse and DON asked me to see. She has a new pressure wound on back overlying a swelling that staff is not sure about. Constance Holster;, abnormal?  Past Medical History:  Diagnosis Date  . Alzheimer disease   . Atrial fibrillation ([facility])   . Chronic kidney disease   . Colon polyp   . Diabetes mellitus without complication ([place])   . Fibromyalgia   . GERD (gastroesophageal reflux disease)   . HOCM (hypertrophic obstructive cardiomyopathy) ([place])   . Hypertension   . Neuropathy [facility])     Past Surgical History:  Procedure Laterality Date  . SHOULDER SURGERY Left    rotator cuff      Medication List       Accurate as of 12/25/15  7:42 PM. Always use your most recent med list.          amLODipine 5 MG tablet Commonly known as:  NORVASC Take 5 mg by mouth daily.   aspirin 81 MG chewable tablet Chew 4 tablets (324 mg total) by mouth daily.   ATIVAN 0.5 MG tablet Generic drug:  LORazepam Take 0.25 mg by mouth every 8 (eight) hours as needed for anxiety.   atorvastatin 10 MG tablet Commonly known as:  LIPITOR Take 1 tablet (10 mg total) by mouth daily at 6 PM.   divalproex 125 MG capsule Commonly known as:  DEPAKOTE SPRINKLE Take 2 capsules (250mg ) by mouth each  morning and take 3 capsules (375 mg) by mouth every evening.   docusate sodium 100 MG capsule Commonly known as:  COLACE Take 1 capsule (100 mg total) by mouth daily.   escitalopram 5 MG tablet Commonly known as:  LEXAPRO Take 10 mg by mouth daily. 2 tablets   feeding supplement (ENSURE ENLIVE) Liqd Take 237 mLs by mouth 3 (three) times daily between meals.   fluticasone 50 MCG/ACT nasal spray Commonly known as:  FLONASE Place 2 sprays into both nostrils daily.   HYDROcodone-acetaminophen 5-325 MG tablet Commonly known as:  NORCO/VICODIN Take 1 tablet by mouth every 4 (four) hours as needed for moderate pain.   loratadine 10 MG tablet Commonly known as:  CLARITIN Take 10 mg by mouth daily as needed for allergies.   metoprolol tartrate 25 mg/10 mL Susp Commonly known as:  LOPRESSOR Give 15 mls (75 mg) by mouth twice daily   omeprazole 40 MG capsule Commonly known as:  PRILOSEC Take 40 mg by mouth daily.   sennosides-docusate sodium 8.6-50 MG tablet Commonly known as:  SENOKOT-S Take 1 tablet by mouth at bedtime.   THERA VITAMIN PO Take 1 tablet by mouth daily.   UNABLE TO FIND Med Name: Med Pass initiate 4 oz  three times daily       No orders of the defined types were placed in this  encounter.   Immunization History  Administered Date(s) Administered  . PPD Test 06/08/2015, 06/25/2015    Social History  Substance Use Topics  . Smoking status: Never Smoker  . Smokeless tobacco: Not on file  . Alcohol use No    Review of Systems  DATA OBTAINED: from patient, nurse GENERAL:  no fevers, fatigue, appetite changes SKIN: No itching, rash HEENT: No complaint RESPIRATORY: No cough, wheezing, SOB CARDIAC: No chest pain, palpitations, lower extremity edema  GI: No abdominal pain, No N/V/D or constipation, No heartburn or reflux  GU: No dysuria, frequency or urgency, or incontinence  MUSCULOSKELETAL: No unrelieved bone/joint pain NEUROLOGIC: No headache,  dizziness  PSYCHIATRIC: No overt anxiety or sadness  Vitals:   12/25/15 1940  BP: (!) 149/73  Pulse: 83  Resp: 18  Temp: 97 F (36.1 C)   Body mass index is 17.87 kg/m. Physical Exam  GENERAL APPEARANCE: Alert, minconversant, No acute distress  SKIN: superfical breakdown over a bony pron=minence on back HEENT: Unremarkable RESPIRATORY: Breathing is even, unlabored. Lung sounds are clear   CARDIOVASCULAR: Heart RRR no murmurs, rubs or gallops. No peripheral edema  GASTROINTESTINAL: Abdomen is soft, non-tender, not distended w/ normal bowel sounds.  GENITOURINARY: Bladder non tender, not distended  MUSCULOSKELETAL: scoliosis, contractures, muscle wasting NEUROLOGIC: Cranial nerves 2-12 grossly intact. Moves all extremities PSYCHIATRIC: Mood and affect appropriate to situation with dementia, no behavioral issues  Patient Active Problem List   Diagnosis Date Noted  . Skin ulcers of both feet ([place]) 10/23/2015  . Vitamin B12 deficiency 10/23/2015  . FTT (failure to thrive) in adult 09/01/2015  . Lewy body dementia 09/01/2015  . Corn or callus 02/06/2015  . Dizziness 02/06/2015  . Neuropathy ([place]) 02/06/2015  . Secondary parkinsonism ([place])   . Depression   . Anxiety state   . Essential hypertension   . HLD (hyperlipidemia)   . Nonorganic psychosis   . Atrial fibrillation ([place]) 01/12/2015  . Sepsis ([place]) 01/12/2015  . Ischemic stroke ([place]) 01/12/2015  . Acute encephalopathy   . Paroxysmal atrial fibrillation (H[facility])   . Schizophrenia ([place])   . Atrial fibrillation with RVR ([place]) 01/11/2015  . SVT (supraventricular tachycardia) ([place]) 01/11/2015  . Alzheimer disease   . Diabetes mellitus without complication ([place])   . HOCM (hypertrophic obstructive cardiomyopathy) ([place])   . HTN (hypertension) 01/07/2015  . Edema 01/06/2015  . Blister of skin without infection 01/05/2015  . Parkinsonian features 12/25/2014  . Weakness 12/18/2014  . Altered mental status 12/11/2014  . UTI  (urinary tract infection) 12/02/2014  . GERD (gastroesophageal reflux disease) 12/02/2014  . Psychosis 12/02/2014  . Renal failure (ARF), acute on chronic (HCC) 11/24/2014  . MDD (major depressive disorder) 11/24/2014  . Pressure ulcer 11/24/2014  . Elevated troponin 11/23/2014  . Difficulty hearing 11/07/2014  . Alzheimer's dementia with behavioral disturbance 11/03/2014  . Severe episode of recurrent major depressive disorder, without psychotic features ([place]) 11/02/2014  . Chronic kidney disease (CKD), stage III (moderate) 09/17/2014  . Rotator cuff arthropathy 07/21/2014  . Diabetic polyneuropathy ([place]) 05/21/2014  . Acid reflux 05/21/2014  . Aortic heart valve narrowing 02/24/2014  . Acquired deformities of toe 07/12/2013  . Hypertrophic obstructive cardiomyopathy ([place]) 07/04/2013  . Left ventricular outflow obstruction 05/31/2012  . Left ventricular hypertrophy 05/07/2011  . Sick sinus syndrome ([place]) 05/07/2011  . Cardiac conduction disorder 05/01/2011  . Diabetes mellitus ([place]) 05/01/2011  . Degeneration of intervertebral disc of lumbosacral region 01/27/2011  . Chronic kidney disease 12/16/2010  . Absence  of bladder continence 12/16/2010  . Diverticulitis 06/03/2010  . Fibromyalgia 06/03/2010  . Arthritis, degenerative 06/03/2010  . Adenomatous colon polyp 08/03/2006    CMP     Component Value Date/Time   NA 138 01/15/2015 0518   K 4.0 01/15/2015 0518   CL 113 (H) 01/15/2015 0518   CO2 18 (L) 01/15/2015 0518   GLUCOSE 139 (H) 01/15/2015 0518   BUN 7 01/15/2015 0518   CREATININE 0.69 01/15/2015 0518   CALCIUM 8.9 01/15/2015 0518   PROT 4.9 (L) 01/14/2015 0154   ALBUMIN 2.0 (L) 01/14/2015 0154   AST 25 01/14/2015 0154   ALT 31 01/14/2015 0154   ALKPHOS 93 01/14/2015 0154   BILITOT 1.1 01/14/2015 0154   GFRNONAA >60 01/15/2015 0518   GFRAA >60 01/15/2015 0518    Recent Labs  01/13/15 1420 01/14/15 0154 01/15/15 0518  NA 138 140 138  K 3.0* 3.4* 4.0  CL  111 111 113*  CO2 20* 18* 18*  GLUCOSE 106* 134* 139*  BUN 9 7 7   CREATININE 0.76 0.81 0.69  CALCIUM 8.8* 8.8* 8.9  MG 1.3* 1.2* 1.6*    Recent Labs  01/12/15 0543 01/13/15 1420 01/14/15 0154  AST 39 32 25  ALT 37 35 31  ALKPHOS 96 99 93  BILITOT 1.2 0.8 1.1  PROT 5.0* 4.8* 4.9*  ALBUMIN 2.1* 2.1* 2.0*    Recent Labs  01/12/15 0543 01/13/15 1420 01/14/15 0154  WBC 10.8* 10.3 9.4  NEUTROABS  --  8.3* 7.3  HGB 12.3 11.5* 11.5*  HCT 36.0 35.4* 35.6*  MCV 89.8 90.5 90.8  PLT 257 236 278    Recent Labs  01/12/15 0543  CHOL 217*  LDLCALC 156*  TRIG 207*   No results found for: [facility] Lab Results  Component Value Date   TSH 3.421 01/12/2015   Lab Results  Component Value Date   HGBA1C 6.1 (H) 01/12/2015   Lab Results  Component Value Date   CHOL 217 (H) 01/12/2015   HDL 20 (L) 01/12/2015   LDLCALC 156 (H) 01/12/2015   TRIG 207 (H) 01/12/2015   CHOLHDL 10.9 01/12/2015    Significant Diagnostic Results in last 30 days:  No results found.  Assessment and Plan  SUPERFICIAL SKIN BREAKDOWN OVER BONY PROMINENCE - pt is very thin but anatomy distorted with scoliosis and flexion of hips; the swelling in question is definitely bone , on sacrum to Right of midline, is probably part of the intermediate sacral crest; its an academic point because even if it was abnormal, which it isn't there wouldn't be anything to do about; it does not appear tender    Labs/tests ordered:    [name], MD

## 2015-12-25 NOTE — Assessment & Plan Note (Signed)
Controlled for her age;plan to continue metoprolol 75 mg BID and norvasc 5 mg daily

## 2015-12-25 NOTE — Assessment & Plan Note (Signed)
Slowly progressive, but pt is still aware of others, answers appropriately to simple questions; cont supportive care

## 2016-01-01 DIAGNOSIS — L89152 Pressure ulcer of sacral region, stage 2: Secondary | ICD-10-CM | POA: Diagnosis not present

## 2016-01-08 DIAGNOSIS — L89152 Pressure ulcer of sacral region, stage 2: Secondary | ICD-10-CM | POA: Diagnosis not present

## 2016-01-09 DIAGNOSIS — F319 Bipolar disorder, unspecified: Secondary | ICD-10-CM | POA: Diagnosis not present

## 2016-01-09 DIAGNOSIS — E1159 Type 2 diabetes mellitus with other circulatory complications: Secondary | ICD-10-CM | POA: Diagnosis not present

## 2016-01-09 DIAGNOSIS — E785 Hyperlipidemia, unspecified: Secondary | ICD-10-CM | POA: Diagnosis not present

## 2016-01-09 DIAGNOSIS — N189 Chronic kidney disease, unspecified: Secondary | ICD-10-CM | POA: Diagnosis not present

## 2016-01-09 DIAGNOSIS — G9009 Other idiopathic peripheral autonomic neuropathy: Secondary | ICD-10-CM | POA: Diagnosis not present

## 2016-01-09 DIAGNOSIS — K219 Gastro-esophageal reflux disease without esophagitis: Secondary | ICD-10-CM | POA: Diagnosis not present

## 2016-01-09 DIAGNOSIS — I679 Cerebrovascular disease, unspecified: Secondary | ICD-10-CM | POA: Diagnosis not present

## 2016-01-09 DIAGNOSIS — J309 Allergic rhinitis, unspecified: Secondary | ICD-10-CM | POA: Diagnosis not present

## 2016-01-09 DIAGNOSIS — I429 Cardiomyopathy, unspecified: Secondary | ICD-10-CM | POA: Diagnosis not present

## 2016-01-09 DIAGNOSIS — M2450 Contracture, unspecified joint: Secondary | ICD-10-CM | POA: Diagnosis not present

## 2016-01-09 DIAGNOSIS — R1310 Dysphagia, unspecified: Secondary | ICD-10-CM | POA: Diagnosis not present

## 2016-01-09 DIAGNOSIS — I10 Essential (primary) hypertension: Secondary | ICD-10-CM | POA: Diagnosis not present

## 2016-01-09 DIAGNOSIS — L8990 Pressure ulcer of unspecified site, unspecified stage: Secondary | ICD-10-CM | POA: Diagnosis not present

## 2016-01-09 DIAGNOSIS — R634 Abnormal weight loss: Secondary | ICD-10-CM | POA: Diagnosis not present

## 2016-01-09 DIAGNOSIS — I4891 Unspecified atrial fibrillation: Secondary | ICD-10-CM | POA: Diagnosis not present

## 2016-01-09 DIAGNOSIS — G3183 Dementia with Lewy bodies: Secondary | ICD-10-CM | POA: Diagnosis not present

## 2016-01-09 DIAGNOSIS — R630 Anorexia: Secondary | ICD-10-CM | POA: Diagnosis not present

## 2016-01-09 DIAGNOSIS — I359 Nonrheumatic aortic valve disorder, unspecified: Secondary | ICD-10-CM | POA: Diagnosis not present

## 2016-01-22 ENCOUNTER — Non-Acute Institutional Stay (SKILLED_NURSING_FACILITY): Payer: Medicare Other | Admitting: Internal Medicine

## 2016-01-22 ENCOUNTER — Encounter: Payer: Self-pay | Admitting: Internal Medicine

## 2016-01-22 DIAGNOSIS — I421 Obstructive hypertrophic cardiomyopathy: Secondary | ICD-10-CM

## 2016-01-22 DIAGNOSIS — E1122 Type 2 diabetes mellitus with diabetic chronic kidney disease: Secondary | ICD-10-CM

## 2016-01-22 DIAGNOSIS — N183 Chronic kidney disease, stage 3 (moderate): Secondary | ICD-10-CM | POA: Diagnosis not present

## 2016-01-22 DIAGNOSIS — K219 Gastro-esophageal reflux disease without esophagitis: Secondary | ICD-10-CM | POA: Diagnosis not present

## 2016-01-22 NOTE — Progress Notes (Signed)
Location:  [place] Room Number: 202D Place of Service:  SNF [phone])  [name], MD  Patient Care Team: Hennie Duos, MD as PCP - General (Internal Medicine)  Extended Emergency Contact Information Primary Emergency Contact: Statz,James Address: [address] [place], [place] [address] [place] Phone: [phone] Relation: Son Secondary Emergency Contact: Rudell Cobb States of [place] Phone: [phone] Relation: Other    Allergies: Cymbalta [duloxetine hcl]; Lipitor [atorvastatin]; Metformin and related; Simvastatin; and Verapamil  Chief Complaint  Patient presents with  . Medical Management of Chronic Issues    Routine Visit    HPI: Patient is 82 y.o. female who is being seen for routine issues of hypertrophic obstructive cardiomyopathy, GERD and DM2..  Past Medical History:  Diagnosis Date  . Alzheimer disease   . Atrial fibrillation ([place])   . Chronic kidney disease   . Colon polyp   . Diabetes mellitus without complication ([place])   . Fibromyalgia   . GERD (gastroesophageal reflux disease)   . HOCM (hypertrophic obstructive cardiomyopathy) ([place])   . Hypertension   . Neuropathy [facility])     Past Surgical History:  Procedure Laterality Date  . SHOULDER SURGERY Left    rotator cuff      Medication List       Accurate as of 01/22/16 11:59 PM. Always use your most recent med list.          amLODipine 5 MG tablet Commonly known as:  NORVASC Take 5 mg by mouth daily.   aspirin 81 MG chewable tablet Chew 324 mg by mouth daily. 4 tablets   ATIVAN 0.5 MG tablet Generic drug:  LORazepam Take 0.25 mg by mouth every 8 (eight) hours as needed for anxiety.   divalproex 125 MG capsule Commonly known as:  DEPAKOTE SPRINKLE Take 2 capsules (250mg ) by mouth each morning and take 3 capsules (375 mg) by mouth every evening.   docusate sodium 100 MG capsule Commonly known as:   COLACE Take 1 capsule (100 mg total) by mouth daily.   escitalopram 5 MG tablet Commonly known as:  LEXAPRO Take 10 mg by mouth daily. 2 tablets   feeding supplement (ENSURE ENLIVE) Liqd Take 237 mLs by mouth 4 (four) times daily -  before meals and at bedtime.   fluticasone 50 MCG/ACT nasal spray Commonly known as:  FLONASE Place 2 sprays into both nostrils daily.   HYDROcodone-acetaminophen 5-325 MG tablet Commonly known as:  NORCO/VICODIN Take 1 tablet by mouth every 4 (four) hours as needed for moderate pain.   loratadine 10 MG tablet Commonly known as:  CLARITIN Take 10 mg by mouth daily as needed for allergies.   metoprolol tartrate 25 mg/10 mL Susp Commonly known as:  LOPRESSOR Give 15 mls (75 mg) by mouth twice daily   omeprazole 40 MG capsule Commonly known as:  PRILOSEC Take 40 mg by mouth daily.   sennosides-docusate sodium 8.6-50 MG tablet Commonly known as:  SENOKOT-S Take 1 tablet by mouth at bedtime.   THERA VITAMIN PO Take 1 tablet by mouth daily.   UNABLE TO FIND Med Name: Med Pass initiate 4 oz  three times daily       Meds ordered this encounter  Medications  . aspirin 81 MG chewable tablet    Sig: Chew 324 mg by mouth daily. 4 tablets    Immunization History  Administered Date(s) Administered  .  Influenza-Unspecified 12/06/2015  . PPD Test 06/08/2015, 06/25/2015    Social History  Substance Use Topics  . Smoking status: Never Smoker  . Smokeless tobacco: Not on file  . Alcohol use No    Review of Systems  DATA OBTAINED: from patient- can not fully participate, can answer  yes or no;  Nursing without concerns GENERAL:  no fevers, fatigue, appetite changes SKIN: No itching, rash HEENT: No complaint RESPIRATORY: No cough, wheezing, SOB CARDIAC: No chest pain, palpitations, lower extremity edema  GI: No abdominal pain, No N/V/D or constipation, No heartburn or reflux  GU: No dysuria, frequency or urgency, or incontinence    MUSCULOSKELETAL: No unrelieved bone/joint pain NEUROLOGIC: No headache, dizziness  PSYCHIATRIC: No overt anxiety or sadness  Vitals:   01/22/16 1025  BP: (!) 149/73  Pulse: 79  Resp: 16  Temp: 97 F (36.1 C)   Body mass index is 17.51 kg/m. Physical Exam  GENERAL APPEARANCE: Alert, No acute distress  SKIN: No diaphoresis rash HEENT: Unremarkable RESPIRATORY: Breathing is even, unlabored. Lung sounds are clear   CARDIOVASCULAR: Heart RRR no murmurs, rubs or gallops. No peripheral edema  GASTROINTESTINAL: Abdomen is soft, non-tender, not distended w/ normal bowel sounds.  GENITOURINARY: Bladder non tender, not distended  MUSCULOSKELETAL: thin, contractures wasting NEUROLOGIC: Cranial nerves 2-12 grossly intact. Moves upper extremities PSYCHIATRIC: Mood and affect appropriate to situation with dementia, no behavioral issues  Patient Active Problem List   Diagnosis Date Noted  . Skin ulcers of both feet ([place]) 10/23/2015  . Vitamin B12 deficiency 10/23/2015  . FTT (failure to thrive) in adult 09/01/2015  . Lewy body dementia 09/01/2015  . Corn or callus 02/06/2015  . Dizziness 02/06/2015  . Neuropathy ([place]) 02/06/2015  . Secondary parkinsonism ([place])   . Depression   . Anxiety state   . Essential hypertension   . HLD (hyperlipidemia)   . Nonorganic psychosis   . Atrial fibrillation ([place]) 01/12/2015  . Sepsis ([place]) 01/12/2015  . Ischemic stroke ([place]) 01/12/2015  . Acute encephalopathy   . Paroxysmal atrial fibrillation (H[facility])   . Schizophrenia ([place])   . Atrial fibrillation with RVR ([place]) 01/11/2015  . SVT (supraventricular tachycardia) ([place]) 01/11/2015  . Alzheimer disease   . Diabetes mellitus without complication ([place])   . HOCM (hypertrophic obstructive cardiomyopathy) ([place])   . HTN (hypertension) 01/07/2015  . Edema 01/06/2015  . Blister of skin without infection 01/05/2015  . Parkinsonian features 12/25/2014  . Weakness 12/18/2014  . Altered mental status  12/11/2014  . UTI (urinary tract infection) 12/02/2014  . GERD (gastroesophageal reflux disease) 12/02/2014  . Psychosis 12/02/2014  . Renal failure (ARF), acute on chronic (HCC) 11/24/2014  . MDD (major depressive disorder) 11/24/2014  . Pressure ulcer 11/24/2014  . Elevated troponin 11/23/2014  . Difficulty hearing 11/07/2014  . Alzheimer's dementia with behavioral disturbance 11/03/2014  . Severe episode of recurrent major depressive disorder, without psychotic features ([place]) 11/02/2014  . Chronic kidney disease (CKD), stage III (moderate) 09/17/2014  . Rotator cuff arthropathy 07/21/2014  . Diabetic polyneuropathy ([place]) 05/21/2014  . Acid reflux 05/21/2014  . Aortic heart valve narrowing 02/24/2014  . Acquired deformities of toe 07/12/2013  . Hypertrophic obstructive cardiomyopathy ([place]) 07/04/2013  . Left ventricular outflow obstruction 05/31/2012  . Left ventricular hypertrophy 05/07/2011  . Sick sinus syndrome ([place]) 05/07/2011  . Cardiac conduction disorder 05/01/2011  . Diabetes mellitus ([place]) 05/01/2011  . Degeneration of intervertebral disc of lumbosacral region 01/27/2011  . Chronic kidney disease 12/16/2010  . Absence  of bladder continence 12/16/2010  . Diverticulitis 06/03/2010  . Fibromyalgia 06/03/2010  . Arthritis, degenerative 06/03/2010  . Adenomatous colon polyp 08/03/2006    CMP     Component Value Date/Time   NA 138 01/15/2015 0518   K 4.0 01/15/2015 0518   CL 113 (H) 01/15/2015 0518   CO2 18 (L) 01/15/2015 0518   GLUCOSE 139 (H) 01/15/2015 0518   BUN 7 01/15/2015 0518   CREATININE 0.69 01/15/2015 0518   CALCIUM 8.9 01/15/2015 0518   PROT 4.9 (L) 01/14/2015 0154   ALBUMIN 2.0 (L) 01/14/2015 0154   AST 25 01/14/2015 0154   ALT 31 01/14/2015 0154   ALKPHOS 93 01/14/2015 0154   BILITOT 1.1 01/14/2015 0154   GFRNONAA >60 01/15/2015 0518   GFRAA >60 01/15/2015 0518   No results for input(s): NA, K, CL, CO2, GLUCOSE, BUN, CREATININE, CALCIUM, MG,  PHOS in the last 8760 hours. No results for input(s): AST, ALT, ALKPHOS, BILITOT, PROT, ALBUMIN in the last 8760 hours. No results for input(s): WBC, NEUTROABS, HGB, HCT, MCV, PLT in the last 8760 hours. No results for input(s): CHOL, LDLCALC, TRIG in the last 8760 hours.  Invalid input(s): HCL No results found for: [facility] Lab Results  Component Value Date   TSH 3.421 01/12/2015   Lab Results  Component Value Date   HGBA1C 6.1 (H) 01/12/2015   Lab Results  Component Value Date   CHOL 217 (H) 01/12/2015   HDL 20 (L) 01/12/2015   LDLCALC 156 (H) 01/12/2015   TRIG 207 (H) 01/12/2015   CHOLHDL 10.9 01/12/2015    Significant Diagnostic Results in last 30 days:  No results found.  Assessment and Plan  Hypertrophic obstructive cardiomyopathy (HCC) Stable on metoprolol 75 mg BID;plan to cont current meds  GERD (gastroesophageal reflux disease) No signs or symptoms of reflux on prilosec 40 mg daily;plan to cont current med  Diabetes mellitus ([place]) Few blood draws 2/2 Hospice, last A1c 6.1, recent BS have been good     Webb Silversmith D. Sheppard Coil, MD

## 2016-01-27 ENCOUNTER — Encounter: Payer: Self-pay | Admitting: Internal Medicine

## 2016-01-27 NOTE — Assessment & Plan Note (Signed)
No signs or symptoms of reflux on prilosec 40 mg daily;plan to cont current med

## 2016-01-27 NOTE — Assessment & Plan Note (Signed)
Few blood draws 2/2 Hospice, last A1c 6.1, recent BS have been good

## 2016-01-27 NOTE — Assessment & Plan Note (Signed)
Stable on metoprolol 75 mg BID;plan to cont current meds

## 2016-02-08 DIAGNOSIS — G9009 Other idiopathic peripheral autonomic neuropathy: Secondary | ICD-10-CM | POA: Diagnosis not present

## 2016-02-08 DIAGNOSIS — N189 Chronic kidney disease, unspecified: Secondary | ICD-10-CM | POA: Diagnosis not present

## 2016-02-08 DIAGNOSIS — I359 Nonrheumatic aortic valve disorder, unspecified: Secondary | ICD-10-CM | POA: Diagnosis not present

## 2016-02-08 DIAGNOSIS — I679 Cerebrovascular disease, unspecified: Secondary | ICD-10-CM | POA: Diagnosis not present

## 2016-02-08 DIAGNOSIS — I10 Essential (primary) hypertension: Secondary | ICD-10-CM | POA: Diagnosis not present

## 2016-02-08 DIAGNOSIS — M2450 Contracture, unspecified joint: Secondary | ICD-10-CM | POA: Diagnosis not present

## 2016-02-08 DIAGNOSIS — F319 Bipolar disorder, unspecified: Secondary | ICD-10-CM | POA: Diagnosis not present

## 2016-02-08 DIAGNOSIS — I429 Cardiomyopathy, unspecified: Secondary | ICD-10-CM | POA: Diagnosis not present

## 2016-02-08 DIAGNOSIS — G3183 Dementia with Lewy bodies: Secondary | ICD-10-CM | POA: Diagnosis not present

## 2016-02-08 DIAGNOSIS — J309 Allergic rhinitis, unspecified: Secondary | ICD-10-CM | POA: Diagnosis not present

## 2016-02-08 DIAGNOSIS — E1159 Type 2 diabetes mellitus with other circulatory complications: Secondary | ICD-10-CM | POA: Diagnosis not present

## 2016-02-08 DIAGNOSIS — R1310 Dysphagia, unspecified: Secondary | ICD-10-CM | POA: Diagnosis not present

## 2016-02-08 DIAGNOSIS — L8990 Pressure ulcer of unspecified site, unspecified stage: Secondary | ICD-10-CM | POA: Diagnosis not present

## 2016-02-08 DIAGNOSIS — I4891 Unspecified atrial fibrillation: Secondary | ICD-10-CM | POA: Diagnosis not present

## 2016-02-08 DIAGNOSIS — R630 Anorexia: Secondary | ICD-10-CM | POA: Diagnosis not present

## 2016-02-08 DIAGNOSIS — K219 Gastro-esophageal reflux disease without esophagitis: Secondary | ICD-10-CM | POA: Diagnosis not present

## 2016-02-08 DIAGNOSIS — E785 Hyperlipidemia, unspecified: Secondary | ICD-10-CM | POA: Diagnosis not present

## 2016-02-08 DIAGNOSIS — R634 Abnormal weight loss: Secondary | ICD-10-CM | POA: Diagnosis not present

## 2016-02-18 ENCOUNTER — Non-Acute Institutional Stay (SKILLED_NURSING_FACILITY): Payer: Medicare Other | Admitting: Internal Medicine

## 2016-02-18 ENCOUNTER — Encounter: Payer: Self-pay | Admitting: Internal Medicine

## 2016-02-18 DIAGNOSIS — G3183 Dementia with Lewy bodies: Secondary | ICD-10-CM

## 2016-02-18 DIAGNOSIS — I480 Paroxysmal atrial fibrillation: Secondary | ICD-10-CM

## 2016-02-18 DIAGNOSIS — F329 Major depressive disorder, single episode, unspecified: Secondary | ICD-10-CM | POA: Diagnosis not present

## 2016-02-18 DIAGNOSIS — F0281 Dementia in other diseases classified elsewhere with behavioral disturbance: Secondary | ICD-10-CM

## 2016-02-18 DIAGNOSIS — F02818 Dementia in other diseases classified elsewhere, unspecified severity, with other behavioral disturbance: Secondary | ICD-10-CM

## 2016-02-18 DIAGNOSIS — F32A Depression, unspecified: Secondary | ICD-10-CM

## 2016-02-18 NOTE — Progress Notes (Signed)
Location:  [facility] Room Number: 202D Place of Service:  SNF [phone])  [name], MD  Patient Care Team: Hennie Duos, MD as PCP - General (Internal Medicine)  Extended Emergency Contact Information Primary Emergency Contact: Dilling,James Address: [address] [place], [place] [address] [place] Phone: [phone] Relation: Son Secondary Emergency Contact: Rudell Cobb [place] Phone: [phone] Relation: Other    Allergies: Cymbalta [duloxetine hcl]; Lipitor [atorvastatin]; Metformin and related; Simvastatin; and Verapamil  Chief Complaint  Patient presents with  . Medical Management of Chronic Issues    Routine Visit    HPI: Patient is 82 y.o. female Hospice pt, who is being seen for routine issues of lewy body dementia, AF and depression.  Past Medical History:  Diagnosis Date  . Alzheimer disease   . Atrial fibrillation ([place])   . Chronic kidney disease   . Colon polyp   . Diabetes mellitus without complication ([place])   . Fibromyalgia   . GERD (gastroesophageal reflux disease)   . HOCM (hypertrophic obstructive cardiomyopathy) ([place])   . Hypertension   . Neuropathy [facility])     Past Surgical History:  Procedure Laterality Date  . SHOULDER SURGERY Left    rotator cuff    Allergies as of 02/18/2016      Reactions   Cymbalta [duloxetine Hcl] Other (See Comments)   Diarrhea Listed on MAR   Lipitor [atorvastatin] Other (See Comments)   myalgias Listed on MAR   Metformin And Related Other (See Comments)   Listed on MAR   Simvastatin Other (See Comments)   Listed on MAR   Verapamil    Sinus arrest      Medication List       Accurate as of 02/18/16 11:59 PM. Always use your most recent med list.          amLODipine 5 MG tablet Commonly known as:  NORVASC Take 5 mg by mouth daily.   aspirin 81 MG chewable tablet Chew 324 mg by mouth daily. 4 tablets     ATIVAN 0.5 MG tablet Generic drug:  LORazepam Take 0.25 mg by mouth every 8 (eight) hours as needed for anxiety.   divalproex 125 MG capsule Commonly known as:  DEPAKOTE SPRINKLE Take 2 capsules (250mg ) by mouth each morning and take 3 capsules (375 mg) by mouth every evening.   docusate sodium 100 MG capsule Commonly known as:  COLACE Take 1 capsule (100 mg total) by mouth daily.   escitalopram 5 MG tablet Commonly known as:  LEXAPRO Take 10 mg by mouth daily. 2 tablets   feeding supplement (ENSURE ENLIVE) Liqd Take 237 mLs by mouth 4 (four) times daily -  before meals and at bedtime.   fluticasone 50 MCG/ACT nasal spray Commonly known as:  FLONASE Place 2 sprays into both nostrils daily.   HYDROcodone-acetaminophen 5-325 MG tablet Commonly known as:  NORCO/VICODIN Take 1 tablet by mouth every 4 (four) hours as needed for moderate pain.   loratadine 10 MG tablet Commonly known as:  CLARITIN Take 10 mg by mouth daily as needed for allergies.   metoprolol tartrate 25 mg/10 mL Susp Commonly known as:  LOPRESSOR Give 15 mls (75 mg) by mouth twice daily   omeprazole 40 MG capsule Commonly known as:  PRILOSEC Take 40 mg by mouth daily.   sennosides-docusate sodium 8.6-50 MG tablet Commonly known as:  SENOKOT-S  Take 1 tablet by mouth at bedtime.   THERA VITAMIN PO Take 1 tablet by mouth daily.   UNABLE TO FIND Med Name: Med Pass initiate 4 oz  three times daily       No orders of the defined types were placed in this encounter.   Immunization History  Administered Date(s) Administered  . Influenza Inj Mdck Quad Pf 12/22/2013  . Influenza, Seasonal, Injecte, Preservative Fre 12/02/2010  . Influenza-Unspecified 12/06/2015  . PPD Test 11/07/2014, 06/08/2015, 06/25/2015  . Pneumococcal Conjugate-13 03/10/1998  . Pneumococcal Polysaccharide-23 10/23/2010  . Zoster 05/05/2007    Social History  Substance Use Topics  . Smoking status: Never Smoker  .  Smokeless tobacco: Not on file  . Alcohol use No    Review of System  UTO 2/2 dementia  Vitals:   02/18/16 0944  BP: (!) 149/73  Pulse: 86  Resp: 18  Temp: 97 F (36.1 C)   Body mass index is 17.51 kg/m. Physical Exam  GENERAL APPEARANCE: Alert, min conversant, No acute distress  SKIN: No diaphoresis rash HEENT: Unremarkable RESPIRATORY: Breathing is even, unlabored. Lung sounds are clear   CARDIOVASCULAR: Heart RRR no murmurs, rubs or gallops. No peripheral edema  GASTROINTESTINAL: Abdomen is soft, non-tender, not distended w/ normal bowel sounds.  GENITOURINARY: Bladder non tender, not distended  MUSCULOSKELETAL: wasting NEUROLOGIC: Cranial nerves 2-12 grossly intact. Moves all extremities minimally, occ echolalia PSYCHIATRIC: dementia, no behavioral issues  Patient Active Problem List   Diagnosis Date Noted  . Skin ulcers of both feet ([place]) 10/23/2015  . Vitamin B12 deficiency 10/23/2015  . FTT (failure to thrive) in adult 09/01/2015  . Lewy body dementia 09/01/2015  . Corn or callus 02/06/2015  . Dizziness 02/06/2015  . Neuropathy ([place]) 02/06/2015  . Secondary parkinsonism ([place])   . Depression   . Anxiety state   . Essential hypertension   . HLD (hyperlipidemia)   . Nonorganic psychosis   . Atrial fibrillation ([place]) 01/12/2015  . Sepsis ([place]) 01/12/2015  . Ischemic stroke ([place]) 01/12/2015  . Acute encephalopathy   . Paroxysmal atrial fibrillation (HCC)   . Schizophrenia ([place])   . Atrial fibrillation with RVR ([place]) 01/11/2015  . SVT (supraventricular tachycardia) ([place]) 01/11/2015  . Alzheimer disease   . Diabetes mellitus without complication ([place])   . HOCM (hypertrophic obstructive cardiomyopathy) ([place])   . HTN (hypertension) 01/07/2015  . Edema 01/06/2015  . Blister of skin without infection 01/05/2015  . Parkinsonian features 12/25/2014  . Weakness 12/18/2014  . Altered mental status 12/11/2014  . UTI (urinary tract infection) 12/02/2014  . GERD  (gastroesophageal reflux disease) 12/02/2014  . Psychosis 12/02/2014  . Renal failure (ARF), acute on chronic (HCC) 11/24/2014  . MDD (major depressive disorder) 11/24/2014  . Pressure ulcer 11/24/2014  . Elevated troponin 11/23/2014  . Difficulty hearing 11/07/2014  . Alzheimer's dementia with behavioral disturbance 11/03/2014  . Severe episode of recurrent major depressive disorder, without psychotic features ([place]) 11/02/2014  . Chronic kidney disease (CKD), stage III (moderate) 09/17/2014  . Rotator cuff arthropathy 07/21/2014  . Diabetic polyneuropathy ([place]) 05/21/2014  . Acid reflux 05/21/2014  . Aortic heart valve narrowing 02/24/2014  . Acquired deformities of toe 07/12/2013  . Hypertrophic obstructive cardiomyopathy ([place]) 07/04/2013  . Left ventricular outflow obstruction 05/31/2012  . Left ventricular hypertrophy 05/07/2011  . Sick sinus syndrome ([place]) 05/07/2011  . Cardiac conduction disorder 05/01/2011  . Diabetes mellitus ([place]) 05/01/2011  . Degeneration of intervertebral disc of lumbosacral region 01/27/2011  . Chronic  kidney disease 12/16/2010  . Absence of bladder continence 12/16/2010  . Diverticulitis 06/03/2010  . Fibromyalgia 06/03/2010  . Arthritis, degenerative 06/03/2010  . Adenomatous colon polyp 08/03/2006    CMP     Component Value Date/Time   NA 138 01/15/2015 0518   K 4.0 01/15/2015 0518   CL 113 (H) 01/15/2015 0518   CO2 18 (L) 01/15/2015 0518   GLUCOSE 139 (H) 01/15/2015 0518   BUN 7 01/15/2015 0518   CREATININE 0.69 01/15/2015 0518   CALCIUM 8.9 01/15/2015 0518   PROT 4.9 (L) 01/14/2015 0154   ALBUMIN 2.0 (L) 01/14/2015 0154   AST 25 01/14/2015 0154   ALT 31 01/14/2015 0154   ALKPHOS 93 01/14/2015 0154   BILITOT 1.1 01/14/2015 0154   GFRNONAA >60 01/15/2015 0518   GFRAA >60 01/15/2015 0518   No results for input(s): NA, K, CL, CO2, GLUCOSE, BUN, CREATININE, CALCIUM, MG, PHOS in the last 8760 hours. No results for input(s): AST, ALT,  ALKPHOS, BILITOT, PROT, ALBUMIN in the last 8760 hours. No results for input(s): WBC, NEUTROABS, HGB, HCT, MCV, PLT in the last 8760 hours. No results for input(s): CHOL, LDLCALC, TRIG in the last 8760 hours.  Invalid input(s): HCL No results found for: [facility] Lab Results  Component Value Date   TSH 3.421 01/12/2015   Lab Results  Component Value Date   HGBA1C 6.1 (H) 01/12/2015   Lab Results  Component Value Date   CHOL 217 (H) 01/12/2015   HDL 20 (L) 01/12/2015   LDLCALC 156 (H) 01/12/2015   TRIG 207 (H) 01/12/2015   CHOLHDL 10.9 01/12/2015    Significant Diagnostic Results in last 30 days:  No results found.  Assessment and Plan  Lewy body dementia Continued decline;plan to cont depakote 250 mg qa and 375 md q HS  Paroxysmal atrial fibrillation (HCC) Stable ;NSR, rate controlled with 75 mg BID and prophylaxe with ASA 324 mg daily  Depression Stable; pt appears content;plan to cont lexapro 10 mg daily    Anne D. Sheppard Coil, MD

## 2016-03-10 DIAGNOSIS — I679 Cerebrovascular disease, unspecified: Secondary | ICD-10-CM | POA: Diagnosis not present

## 2016-03-10 DIAGNOSIS — I10 Essential (primary) hypertension: Secondary | ICD-10-CM | POA: Diagnosis not present

## 2016-03-10 DIAGNOSIS — I4891 Unspecified atrial fibrillation: Secondary | ICD-10-CM | POA: Diagnosis not present

## 2016-03-10 DIAGNOSIS — I429 Cardiomyopathy, unspecified: Secondary | ICD-10-CM | POA: Diagnosis not present

## 2016-03-10 DIAGNOSIS — N189 Chronic kidney disease, unspecified: Secondary | ICD-10-CM | POA: Diagnosis not present

## 2016-03-10 DIAGNOSIS — R634 Abnormal weight loss: Secondary | ICD-10-CM | POA: Diagnosis not present

## 2016-03-10 DIAGNOSIS — F319 Bipolar disorder, unspecified: Secondary | ICD-10-CM | POA: Diagnosis not present

## 2016-03-10 DIAGNOSIS — M2450 Contracture, unspecified joint: Secondary | ICD-10-CM | POA: Diagnosis not present

## 2016-03-10 DIAGNOSIS — J309 Allergic rhinitis, unspecified: Secondary | ICD-10-CM | POA: Diagnosis not present

## 2016-03-10 DIAGNOSIS — E1159 Type 2 diabetes mellitus with other circulatory complications: Secondary | ICD-10-CM | POA: Diagnosis not present

## 2016-03-10 DIAGNOSIS — E785 Hyperlipidemia, unspecified: Secondary | ICD-10-CM | POA: Diagnosis not present

## 2016-03-10 DIAGNOSIS — K219 Gastro-esophageal reflux disease without esophagitis: Secondary | ICD-10-CM | POA: Diagnosis not present

## 2016-03-10 DIAGNOSIS — R1310 Dysphagia, unspecified: Secondary | ICD-10-CM | POA: Diagnosis not present

## 2016-03-10 DIAGNOSIS — G9009 Other idiopathic peripheral autonomic neuropathy: Secondary | ICD-10-CM | POA: Diagnosis not present

## 2016-03-10 DIAGNOSIS — G3183 Dementia with Lewy bodies: Secondary | ICD-10-CM | POA: Diagnosis not present

## 2016-03-10 DIAGNOSIS — R630 Anorexia: Secondary | ICD-10-CM | POA: Diagnosis not present

## 2016-03-10 DIAGNOSIS — L8990 Pressure ulcer of unspecified site, unspecified stage: Secondary | ICD-10-CM | POA: Diagnosis not present

## 2016-03-10 DIAGNOSIS — I359 Nonrheumatic aortic valve disorder, unspecified: Secondary | ICD-10-CM | POA: Diagnosis not present

## 2016-03-11 DIAGNOSIS — I4891 Unspecified atrial fibrillation: Secondary | ICD-10-CM | POA: Diagnosis not present

## 2016-03-11 DIAGNOSIS — I359 Nonrheumatic aortic valve disorder, unspecified: Secondary | ICD-10-CM | POA: Diagnosis not present

## 2016-03-11 DIAGNOSIS — N189 Chronic kidney disease, unspecified: Secondary | ICD-10-CM | POA: Diagnosis not present

## 2016-03-11 DIAGNOSIS — G3183 Dementia with Lewy bodies: Secondary | ICD-10-CM | POA: Diagnosis not present

## 2016-03-11 DIAGNOSIS — I429 Cardiomyopathy, unspecified: Secondary | ICD-10-CM | POA: Diagnosis not present

## 2016-03-11 DIAGNOSIS — I679 Cerebrovascular disease, unspecified: Secondary | ICD-10-CM | POA: Diagnosis not present

## 2016-03-12 DIAGNOSIS — N189 Chronic kidney disease, unspecified: Secondary | ICD-10-CM | POA: Diagnosis not present

## 2016-03-12 DIAGNOSIS — G3183 Dementia with Lewy bodies: Secondary | ICD-10-CM | POA: Diagnosis not present

## 2016-03-12 DIAGNOSIS — I679 Cerebrovascular disease, unspecified: Secondary | ICD-10-CM | POA: Diagnosis not present

## 2016-03-12 DIAGNOSIS — I4891 Unspecified atrial fibrillation: Secondary | ICD-10-CM | POA: Diagnosis not present

## 2016-03-12 DIAGNOSIS — I359 Nonrheumatic aortic valve disorder, unspecified: Secondary | ICD-10-CM | POA: Diagnosis not present

## 2016-03-12 DIAGNOSIS — I429 Cardiomyopathy, unspecified: Secondary | ICD-10-CM | POA: Diagnosis not present

## 2016-03-14 DIAGNOSIS — I679 Cerebrovascular disease, unspecified: Secondary | ICD-10-CM | POA: Diagnosis not present

## 2016-03-14 DIAGNOSIS — I359 Nonrheumatic aortic valve disorder, unspecified: Secondary | ICD-10-CM | POA: Diagnosis not present

## 2016-03-14 DIAGNOSIS — I429 Cardiomyopathy, unspecified: Secondary | ICD-10-CM | POA: Diagnosis not present

## 2016-03-14 DIAGNOSIS — N189 Chronic kidney disease, unspecified: Secondary | ICD-10-CM | POA: Diagnosis not present

## 2016-03-14 DIAGNOSIS — I4891 Unspecified atrial fibrillation: Secondary | ICD-10-CM | POA: Diagnosis not present

## 2016-03-14 DIAGNOSIS — G3183 Dementia with Lewy bodies: Secondary | ICD-10-CM | POA: Diagnosis not present

## 2016-03-15 ENCOUNTER — Encounter: Payer: Self-pay | Admitting: Internal Medicine

## 2016-03-15 NOTE — Assessment & Plan Note (Signed)
Stable; pt appears content;plan to cont lexapro 10 mg daily

## 2016-03-15 NOTE — Assessment & Plan Note (Signed)
Continued decline;plan to cont depakote 250 mg qa and 375 md q HS

## 2016-03-15 NOTE — Assessment & Plan Note (Signed)
Stable ;NSR, rate controlled with 75 mg BID and prophylaxe with ASA 324 mg daily

## 2016-03-17 DIAGNOSIS — I679 Cerebrovascular disease, unspecified: Secondary | ICD-10-CM | POA: Diagnosis not present

## 2016-03-17 DIAGNOSIS — G3183 Dementia with Lewy bodies: Secondary | ICD-10-CM | POA: Diagnosis not present

## 2016-03-17 DIAGNOSIS — I359 Nonrheumatic aortic valve disorder, unspecified: Secondary | ICD-10-CM | POA: Diagnosis not present

## 2016-03-17 DIAGNOSIS — I429 Cardiomyopathy, unspecified: Secondary | ICD-10-CM | POA: Diagnosis not present

## 2016-03-17 DIAGNOSIS — N189 Chronic kidney disease, unspecified: Secondary | ICD-10-CM | POA: Diagnosis not present

## 2016-03-17 DIAGNOSIS — I4891 Unspecified atrial fibrillation: Secondary | ICD-10-CM | POA: Diagnosis not present

## 2016-03-19 DIAGNOSIS — I429 Cardiomyopathy, unspecified: Secondary | ICD-10-CM | POA: Diagnosis not present

## 2016-03-19 DIAGNOSIS — N189 Chronic kidney disease, unspecified: Secondary | ICD-10-CM | POA: Diagnosis not present

## 2016-03-19 DIAGNOSIS — I359 Nonrheumatic aortic valve disorder, unspecified: Secondary | ICD-10-CM | POA: Diagnosis not present

## 2016-03-19 DIAGNOSIS — G3183 Dementia with Lewy bodies: Secondary | ICD-10-CM | POA: Diagnosis not present

## 2016-03-19 DIAGNOSIS — I679 Cerebrovascular disease, unspecified: Secondary | ICD-10-CM | POA: Diagnosis not present

## 2016-03-19 DIAGNOSIS — I4891 Unspecified atrial fibrillation: Secondary | ICD-10-CM | POA: Diagnosis not present

## 2016-03-20 ENCOUNTER — Non-Acute Institutional Stay (SKILLED_NURSING_FACILITY): Payer: Medicare Other | Admitting: Internal Medicine

## 2016-03-20 ENCOUNTER — Encounter: Payer: Self-pay | Admitting: Internal Medicine

## 2016-03-20 DIAGNOSIS — I429 Cardiomyopathy, unspecified: Secondary | ICD-10-CM | POA: Diagnosis not present

## 2016-03-20 DIAGNOSIS — F209 Schizophrenia, unspecified: Secondary | ICD-10-CM

## 2016-03-20 DIAGNOSIS — G301 Alzheimer's disease with late onset: Secondary | ICD-10-CM

## 2016-03-20 DIAGNOSIS — I359 Nonrheumatic aortic valve disorder, unspecified: Secondary | ICD-10-CM | POA: Diagnosis not present

## 2016-03-20 DIAGNOSIS — F29 Unspecified psychosis not due to a substance or known physiological condition: Secondary | ICD-10-CM | POA: Diagnosis not present

## 2016-03-20 DIAGNOSIS — I4891 Unspecified atrial fibrillation: Secondary | ICD-10-CM | POA: Diagnosis not present

## 2016-03-20 DIAGNOSIS — G3183 Dementia with Lewy bodies: Secondary | ICD-10-CM | POA: Diagnosis not present

## 2016-03-20 DIAGNOSIS — N189 Chronic kidney disease, unspecified: Secondary | ICD-10-CM | POA: Diagnosis not present

## 2016-03-20 DIAGNOSIS — F0281 Dementia in other diseases classified elsewhere with behavioral disturbance: Secondary | ICD-10-CM

## 2016-03-20 DIAGNOSIS — I679 Cerebrovascular disease, unspecified: Secondary | ICD-10-CM | POA: Diagnosis not present

## 2016-03-20 NOTE — Progress Notes (Signed)
Location:  [facility] Room Number: 202D Place of Service:  SNF [phone])  Emma Homes, MD  Patient Care Team: Emma Duos, MD as PCP - General (Internal Medicine)  Extended Emergency Contact Information Primary Emergency Contact: Mcguire,Emma Address: [address] [place], [place] [address] [place] Phone: [phone] Relation: Son Secondary Emergency Contact: Emma Mcguire [place] of [place] Phone: [phone] Relation: Other    Allergies: Cymbalta [duloxetine hcl]; Lipitor [atorvastatin]; Metformin and related; Simvastatin; and Verapamil  Chief Complaint  Patient presents with  . Medical Management of Chronic Issues    Routine Visit    HPI: Patient is 83 y.o. female who is being seen for routine issues of dementia, psychosis and schizophrenia.  Past Medical History:  Diagnosis Date  . Alzheimer disease   . Atrial fibrillation ([place])   . Chronic kidney disease   . Colon polyp   . Diabetes mellitus without complication ([place])   . Fibromyalgia   . GERD (gastroesophageal reflux disease)   . HOCM (hypertrophic obstructive cardiomyopathy) ([place])   . Hypertension   . Neuropathy [facility])     Past Surgical History:  Procedure Laterality Date  . SHOULDER SURGERY Left    rotator cuff    Allergies as of 03/20/2016      Reactions   Cymbalta [duloxetine Hcl] Other (See Comments)   Diarrhea Listed on MAR   Lipitor [atorvastatin] Other (See Comments)   myalgias Listed on MAR   Metformin And Related Other (See Comments)   Listed on MAR   Simvastatin Other (See Comments)   Listed on MAR   Verapamil    Sinus arrest      Medication List       Accurate as of 03/20/16 11:59 PM. Always use your most recent med list.          amLODipine 5 MG tablet Commonly known as:  NORVASC Take 5 mg by mouth daily.   aspirin 81 MG chewable tablet Chew 324 mg by mouth daily. 4 tablets   ATIVAN 0.5 MG  tablet Generic drug:  LORazepam Take 0.25 mg by mouth every 8 (eight) hours as needed for anxiety.   divalproex 125 MG capsule Commonly known as:  DEPAKOTE SPRINKLE Take 2 capsules (250mg ) by mouth each morning and take 3 capsules (375 mg) by mouth every evening.   docusate sodium 100 MG capsule Commonly known as:  COLACE Take 1 capsule (100 mg total) by mouth daily.   escitalopram 5 MG tablet Commonly known as:  LEXAPRO Take 10 mg by mouth daily. 2 tablets   feeding supplement (ENSURE ENLIVE) Liqd Take 237 mLs by mouth 4 (four) times daily -  before meals and at bedtime.   fluticasone 50 MCG/ACT nasal spray Commonly known as:  FLONASE Place 2 sprays into both nostrils daily.   HYDROcodone-acetaminophen 5-325 MG tablet Commonly known as:  NORCO/VICODIN Take 1 tablet by mouth every 4 (four) hours as needed for moderate pain.   loratadine 10 MG tablet Commonly known as:  CLARITIN Take 10 mg by mouth daily as needed for allergies.   metoprolol tartrate 25 mg/10 mL Susp Commonly known as:  LOPRESSOR Give 15 mls (75 mg) by mouth twice daily   omeprazole 40 MG capsule Commonly known as:  PRILOSEC Take 40 mg by mouth daily.   sennosides-docusate sodium 8.6-50 MG tablet Commonly known as:  SENOKOT-S Take 1 tablet by mouth  at bedtime.   THERA VITAMIN PO Take 1 tablet by mouth daily.   UNABLE TO FIND Med Name: Med Pass initiate 4 oz  three times daily       No orders of the defined types were placed in this encounter.   Immunization History  Administered Date(s) Administered  . Influenza Inj Mdck Quad Pf 12/22/2013  . Influenza, Seasonal, Injecte, Preservative Fre 12/02/2010  . Influenza-Unspecified 12/06/2015  . PPD Test 11/07/2014, 06/08/2015, 06/25/2015  . Pneumococcal Conjugate-13 03/10/1998  . Pneumococcal Polysaccharide-23 10/23/2010  . Zoster 05/05/2007    Social History  Substance Use Topics  . Smoking status: Never Smoker  . Smokeless tobacco: Not  on file  . Alcohol use No    Review of Systems  UTO 2/2 dementia    Vitals:   03/20/16 1051  BP: (!) 149/73  Pulse: 79  Resp: 18  Temp: 97 F (36.1 C)   Body mass index is 17.4 kg/m. Physical Exam  GENERAL APPEARANCE: Alert,  No acute distress , answers direct questions SKIN: No diaphoresis rash HEENT: Unremarkable RESPIRATORY: Breathing is even, unlabored. Lung sounds are clear   CARDIOVASCULAR: Heart RRR no murmurs, rubs or gallops. No peripheral edema  GASTROINTESTINAL: Abdomen is soft, non-tender, not distended w/ normal bowel sounds.  GENITOURINARY: Bladder non tender, not distended  MUSCULOSKELETAL: No abnormal joints or musculature NEUROLOGIC: Cranial nerves 2-12 grossly intact ; quadriplegia, some movement of BUE PSYCHIATRIC: dementia, no behavioral issues  Patient Active Problem List   Diagnosis Date Noted  . Skin ulcers of both feet ([place]) 10/23/2015  . Vitamin B12 deficiency 10/23/2015  . FTT (failure to thrive) in adult 09/01/2015  . Lewy body dementia 09/01/2015  . Corn or callus 02/06/2015  . Dizziness 02/06/2015  . Neuropathy ([place]) 02/06/2015  . Secondary parkinsonism ([place])   . Depression   . Anxiety state   . Essential hypertension   . HLD (hyperlipidemia)   . Nonorganic psychosis   . Atrial fibrillation ([place]) 01/12/2015  . Sepsis ([place]) 01/12/2015  . Ischemic stroke ([place]) 01/12/2015  . Acute encephalopathy   . Paroxysmal atrial fibrillation (HCC)   . Schizophrenia ([place])   . Atrial fibrillation with RVR ([place]) 01/11/2015  . SVT (supraventricular tachycardia) ([place]) 01/11/2015  . Alzheimer disease   . Diabetes mellitus without complication ([place])   . HOCM (hypertrophic obstructive cardiomyopathy) ([place])   . HTN (hypertension) 01/07/2015  . Edema 01/06/2015  . Blister of skin without infection 01/05/2015  . Parkinsonian features 12/25/2014  . Weakness 12/18/2014  . Altered mental status 12/11/2014  . UTI (urinary tract infection) 12/02/2014  .  GERD (gastroesophageal reflux disease) 12/02/2014  . Psychosis 12/02/2014  . Renal failure (ARF), acute on chronic (HCC) 11/24/2014  . MDD (major depressive disorder) 11/24/2014  . Pressure ulcer 11/24/2014  . Elevated troponin 11/23/2014  . Difficulty hearing 11/07/2014  . Alzheimer's dementia with behavioral disturbance 11/03/2014  . Severe episode of recurrent major depressive disorder, without psychotic features ([facility]) 11/02/2014  . Chronic kidney disease (CKD), stage III (moderate) 09/17/2014  . Rotator cuff arthropathy 07/21/2014  . Diabetic polyneuropathy ([place]) 05/21/2014  . Acid reflux 05/21/2014  . Aortic heart valve narrowing 02/24/2014  . Acquired deformities of toe 07/12/2013  . Hypertrophic obstructive cardiomyopathy ([place]) 07/04/2013  . Left ventricular outflow obstruction 05/31/2012  . Left ventricular hypertrophy 05/07/2011  . Sick sinus syndrome ([place]) 05/07/2011  . Cardiac conduction disorder 05/01/2011  . Diabetes mellitus ([place]) 05/01/2011  . Degeneration of intervertebral disc of lumbosacral region 01/27/2011  .  Chronic kidney disease 12/16/2010  . Absence of bladder continence 12/16/2010  . Diverticulitis 06/03/2010  . Fibromyalgia 06/03/2010  . Arthritis, degenerative 06/03/2010  . Adenomatous colon polyp 08/03/2006    CMP     Component Value Date/Time   NA 138 01/15/2015 0518   K 4.0 01/15/2015 0518   CL 113 (H) 01/15/2015 0518   CO2 18 (L) 01/15/2015 0518   GLUCOSE 139 (H) 01/15/2015 0518   BUN 7 01/15/2015 0518   CREATININE 0.69 01/15/2015 0518   CALCIUM 8.9 01/15/2015 0518   PROT 4.9 (L) 01/14/2015 0154   ALBUMIN 2.0 (L) 01/14/2015 0154   AST 25 01/14/2015 0154   ALT 31 01/14/2015 0154   ALKPHOS 93 01/14/2015 0154   BILITOT 1.1 01/14/2015 0154   GFRNONAA >60 01/15/2015 0518   GFRAA >60 01/15/2015 0518   No results for input(s): NA, K, CL, CO2, GLUCOSE, BUN, CREATININE, CALCIUM, MG, PHOS in the last 8760 hours. No results for input(s): AST,  ALT, ALKPHOS, BILITOT, PROT, ALBUMIN in the last 8760 hours. No results for input(s): WBC, NEUTROABS, HGB, HCT, MCV, PLT in the last 8760 hours. No results for input(s): CHOL, LDLCALC, TRIG in the last 8760 hours.  Invalid input(s): HCL No results found for: [facility] Lab Results  Component Value Date   TSH 3.421 01/12/2015   Lab Results  Component Value Date   HGBA1C 6.1 (H) 01/12/2015   Lab Results  Component Value Date   CHOL 217 (H) 01/12/2015   HDL 20 (L) 01/12/2015   LDLCALC 156 (H) 01/12/2015   TRIG 207 (H) 01/12/2015   CHOLHDL 10.9 01/12/2015    Significant Diagnostic Results in last 30 days:  No results found.  Assessment and Plan  Alzheimer's dementia with behavioral disturbance Chronic and progressive;pt on no dementia specific meds; cont supportive care  Psychosis Controlled without apparent hallucination; cont depakote 250 mg q am and 375 mg qHS  Schizophrenia ([place]) Does not appear unhappy or fearful ;plan to cont depakote 250 mg q am and 375 mg qHS    Emma Mcguire D. Sheppard Coil, MD

## 2016-03-21 DIAGNOSIS — I429 Cardiomyopathy, unspecified: Secondary | ICD-10-CM | POA: Diagnosis not present

## 2016-03-21 DIAGNOSIS — G3183 Dementia with Lewy bodies: Secondary | ICD-10-CM | POA: Diagnosis not present

## 2016-03-21 DIAGNOSIS — N189 Chronic kidney disease, unspecified: Secondary | ICD-10-CM | POA: Diagnosis not present

## 2016-03-21 DIAGNOSIS — I4891 Unspecified atrial fibrillation: Secondary | ICD-10-CM | POA: Diagnosis not present

## 2016-03-21 DIAGNOSIS — I359 Nonrheumatic aortic valve disorder, unspecified: Secondary | ICD-10-CM | POA: Diagnosis not present

## 2016-03-21 DIAGNOSIS — I679 Cerebrovascular disease, unspecified: Secondary | ICD-10-CM | POA: Diagnosis not present

## 2016-03-23 ENCOUNTER — Encounter: Payer: Self-pay | Admitting: Internal Medicine

## 2016-03-23 NOTE — Assessment & Plan Note (Signed)
Does not appear unhappy or fearful ;plan to cont depakote 250 mg q am and 375 mg qHS

## 2016-03-23 NOTE — Assessment & Plan Note (Signed)
Chronic and progressive;pt on no dementia specific meds; cont supportive care

## 2016-03-23 NOTE — Assessment & Plan Note (Signed)
Controlled without apparent hallucination; cont depakote 250 mg q am and 375 mg qHS

## 2016-03-24 DIAGNOSIS — G3183 Dementia with Lewy bodies: Secondary | ICD-10-CM | POA: Diagnosis not present

## 2016-03-24 DIAGNOSIS — I359 Nonrheumatic aortic valve disorder, unspecified: Secondary | ICD-10-CM | POA: Diagnosis not present

## 2016-03-24 DIAGNOSIS — I429 Cardiomyopathy, unspecified: Secondary | ICD-10-CM | POA: Diagnosis not present

## 2016-03-24 DIAGNOSIS — I679 Cerebrovascular disease, unspecified: Secondary | ICD-10-CM | POA: Diagnosis not present

## 2016-03-24 DIAGNOSIS — I4891 Unspecified atrial fibrillation: Secondary | ICD-10-CM | POA: Diagnosis not present

## 2016-03-24 DIAGNOSIS — N189 Chronic kidney disease, unspecified: Secondary | ICD-10-CM | POA: Diagnosis not present

## 2016-03-26 DIAGNOSIS — I429 Cardiomyopathy, unspecified: Secondary | ICD-10-CM | POA: Diagnosis not present

## 2016-03-26 DIAGNOSIS — G3183 Dementia with Lewy bodies: Secondary | ICD-10-CM | POA: Diagnosis not present

## 2016-03-26 DIAGNOSIS — N189 Chronic kidney disease, unspecified: Secondary | ICD-10-CM | POA: Diagnosis not present

## 2016-03-26 DIAGNOSIS — I359 Nonrheumatic aortic valve disorder, unspecified: Secondary | ICD-10-CM | POA: Diagnosis not present

## 2016-03-26 DIAGNOSIS — I4891 Unspecified atrial fibrillation: Secondary | ICD-10-CM | POA: Diagnosis not present

## 2016-03-26 DIAGNOSIS — I679 Cerebrovascular disease, unspecified: Secondary | ICD-10-CM | POA: Diagnosis not present

## 2016-03-27 DIAGNOSIS — G3183 Dementia with Lewy bodies: Secondary | ICD-10-CM | POA: Diagnosis not present

## 2016-03-27 DIAGNOSIS — N189 Chronic kidney disease, unspecified: Secondary | ICD-10-CM | POA: Diagnosis not present

## 2016-03-27 DIAGNOSIS — I679 Cerebrovascular disease, unspecified: Secondary | ICD-10-CM | POA: Diagnosis not present

## 2016-03-27 DIAGNOSIS — I359 Nonrheumatic aortic valve disorder, unspecified: Secondary | ICD-10-CM | POA: Diagnosis not present

## 2016-03-27 DIAGNOSIS — I4891 Unspecified atrial fibrillation: Secondary | ICD-10-CM | POA: Diagnosis not present

## 2016-03-27 DIAGNOSIS — I429 Cardiomyopathy, unspecified: Secondary | ICD-10-CM | POA: Diagnosis not present

## 2016-03-28 DIAGNOSIS — N189 Chronic kidney disease, unspecified: Secondary | ICD-10-CM | POA: Diagnosis not present

## 2016-03-28 DIAGNOSIS — I679 Cerebrovascular disease, unspecified: Secondary | ICD-10-CM | POA: Diagnosis not present

## 2016-03-28 DIAGNOSIS — I359 Nonrheumatic aortic valve disorder, unspecified: Secondary | ICD-10-CM | POA: Diagnosis not present

## 2016-03-28 DIAGNOSIS — I4891 Unspecified atrial fibrillation: Secondary | ICD-10-CM | POA: Diagnosis not present

## 2016-03-28 DIAGNOSIS — I429 Cardiomyopathy, unspecified: Secondary | ICD-10-CM | POA: Diagnosis not present

## 2016-03-28 DIAGNOSIS — G3183 Dementia with Lewy bodies: Secondary | ICD-10-CM | POA: Diagnosis not present

## 2016-03-31 DIAGNOSIS — I679 Cerebrovascular disease, unspecified: Secondary | ICD-10-CM | POA: Diagnosis not present

## 2016-03-31 DIAGNOSIS — G3183 Dementia with Lewy bodies: Secondary | ICD-10-CM | POA: Diagnosis not present

## 2016-03-31 DIAGNOSIS — I4891 Unspecified atrial fibrillation: Secondary | ICD-10-CM | POA: Diagnosis not present

## 2016-03-31 DIAGNOSIS — I429 Cardiomyopathy, unspecified: Secondary | ICD-10-CM | POA: Diagnosis not present

## 2016-03-31 DIAGNOSIS — N189 Chronic kidney disease, unspecified: Secondary | ICD-10-CM | POA: Diagnosis not present

## 2016-03-31 DIAGNOSIS — I359 Nonrheumatic aortic valve disorder, unspecified: Secondary | ICD-10-CM | POA: Diagnosis not present

## 2016-04-02 DIAGNOSIS — I679 Cerebrovascular disease, unspecified: Secondary | ICD-10-CM | POA: Diagnosis not present

## 2016-04-02 DIAGNOSIS — I4891 Unspecified atrial fibrillation: Secondary | ICD-10-CM | POA: Diagnosis not present

## 2016-04-02 DIAGNOSIS — N189 Chronic kidney disease, unspecified: Secondary | ICD-10-CM | POA: Diagnosis not present

## 2016-04-02 DIAGNOSIS — G3183 Dementia with Lewy bodies: Secondary | ICD-10-CM | POA: Diagnosis not present

## 2016-04-02 DIAGNOSIS — I359 Nonrheumatic aortic valve disorder, unspecified: Secondary | ICD-10-CM | POA: Diagnosis not present

## 2016-04-02 DIAGNOSIS — I429 Cardiomyopathy, unspecified: Secondary | ICD-10-CM | POA: Diagnosis not present

## 2016-04-03 DIAGNOSIS — I4891 Unspecified atrial fibrillation: Secondary | ICD-10-CM | POA: Diagnosis not present

## 2016-04-03 DIAGNOSIS — G3183 Dementia with Lewy bodies: Secondary | ICD-10-CM | POA: Diagnosis not present

## 2016-04-03 DIAGNOSIS — I679 Cerebrovascular disease, unspecified: Secondary | ICD-10-CM | POA: Diagnosis not present

## 2016-04-03 DIAGNOSIS — I359 Nonrheumatic aortic valve disorder, unspecified: Secondary | ICD-10-CM | POA: Diagnosis not present

## 2016-04-03 DIAGNOSIS — N189 Chronic kidney disease, unspecified: Secondary | ICD-10-CM | POA: Diagnosis not present

## 2016-04-03 DIAGNOSIS — I429 Cardiomyopathy, unspecified: Secondary | ICD-10-CM | POA: Diagnosis not present

## 2016-04-03 IMAGING — CR DG CHEST 1V PORT
1 series · 1 of 1 positions shown · non-contrast
Comparison: 11/24/2014 chest radiograph

CLINICAL DATA: Mental status change.

EXAM:
PORTABLE CHEST 1 VIEW

[AP]
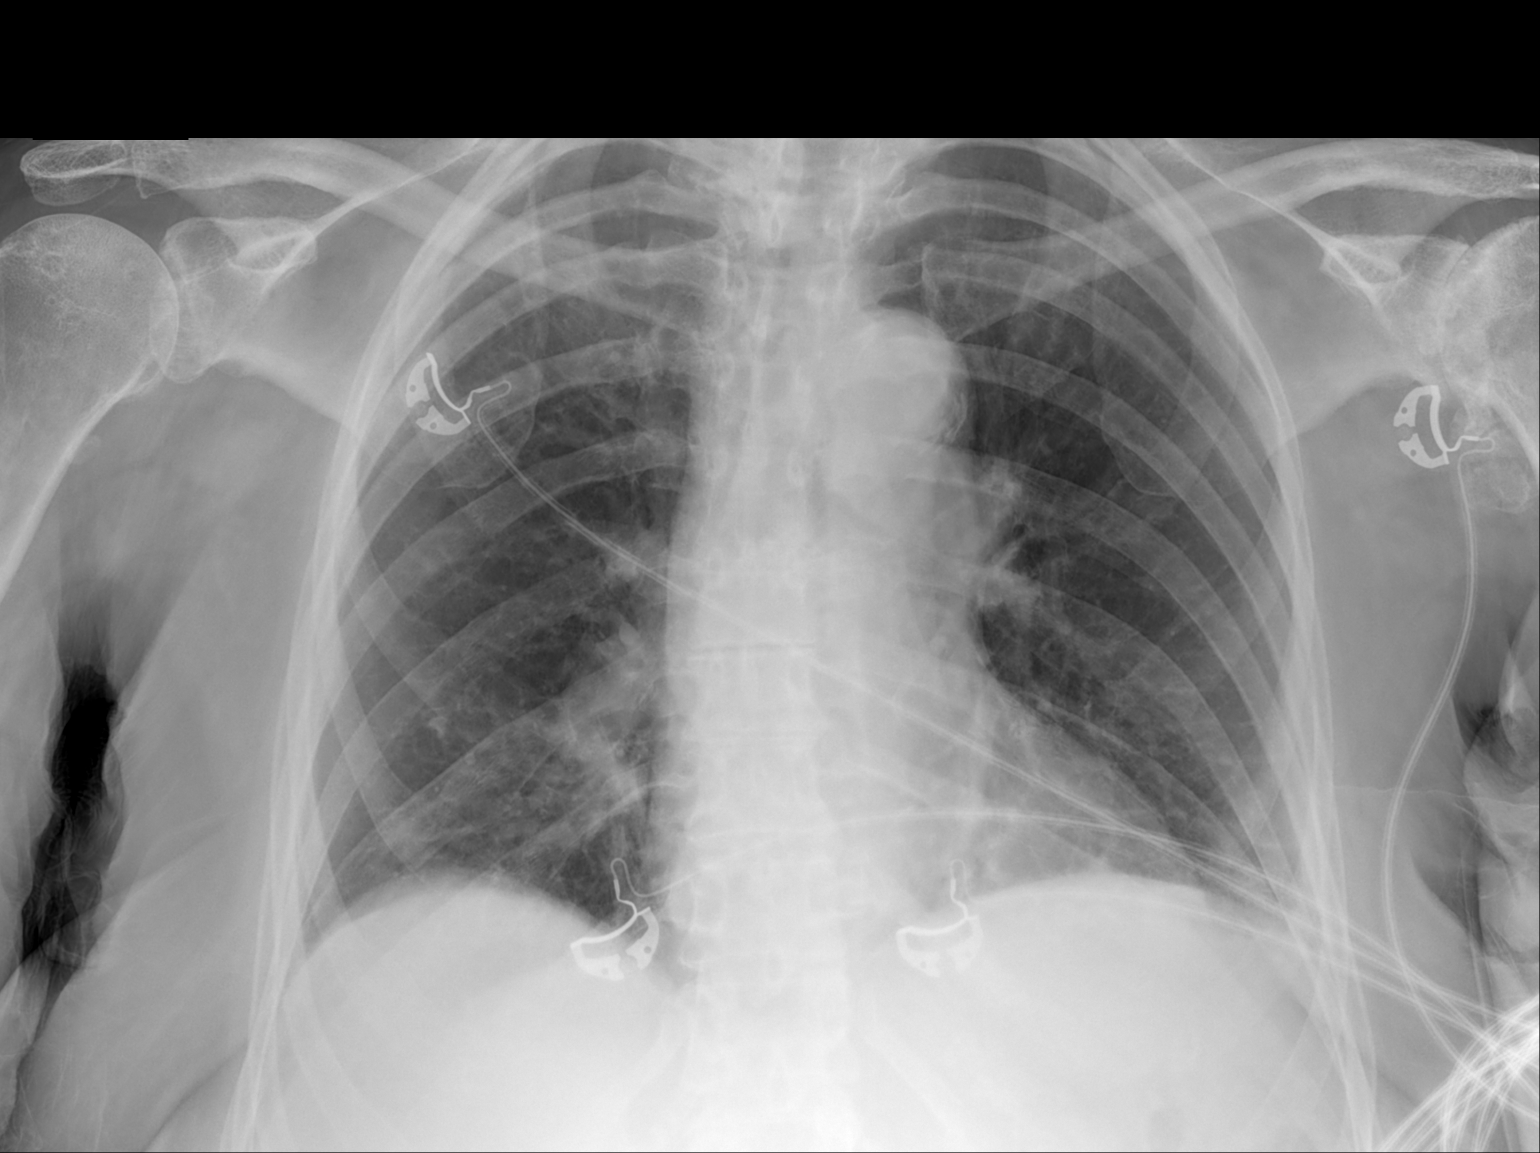

[1 of 1 positions shown; findings below may reference images not displayed]

FINDINGS: Stable cardiomediastinal silhouette with normal heart size and
atherosclerotic thoracic aorta. No pneumothorax. No pleural
effusion. No pulmonary edema. There is stable mild scarring versus
atelectasis at the left costophrenic angle. No new focal lung
opacity. There is stable severe osteoarthritis in the left
glenohumeral joint.
IMPRESSION: Stable mild scarring versus atelectasis at the lateral left lung
base. Otherwise no active disease in the chest.

## 2016-04-04 DIAGNOSIS — N189 Chronic kidney disease, unspecified: Secondary | ICD-10-CM | POA: Diagnosis not present

## 2016-04-04 DIAGNOSIS — I359 Nonrheumatic aortic valve disorder, unspecified: Secondary | ICD-10-CM | POA: Diagnosis not present

## 2016-04-04 DIAGNOSIS — I679 Cerebrovascular disease, unspecified: Secondary | ICD-10-CM | POA: Diagnosis not present

## 2016-04-04 DIAGNOSIS — I4891 Unspecified atrial fibrillation: Secondary | ICD-10-CM | POA: Diagnosis not present

## 2016-04-04 DIAGNOSIS — I429 Cardiomyopathy, unspecified: Secondary | ICD-10-CM | POA: Diagnosis not present

## 2016-04-04 DIAGNOSIS — G3183 Dementia with Lewy bodies: Secondary | ICD-10-CM | POA: Diagnosis not present

## 2016-04-05 DIAGNOSIS — I679 Cerebrovascular disease, unspecified: Secondary | ICD-10-CM | POA: Diagnosis not present

## 2016-04-05 DIAGNOSIS — I429 Cardiomyopathy, unspecified: Secondary | ICD-10-CM | POA: Diagnosis not present

## 2016-04-05 DIAGNOSIS — I4891 Unspecified atrial fibrillation: Secondary | ICD-10-CM | POA: Diagnosis not present

## 2016-04-05 DIAGNOSIS — I359 Nonrheumatic aortic valve disorder, unspecified: Secondary | ICD-10-CM | POA: Diagnosis not present

## 2016-04-05 DIAGNOSIS — N189 Chronic kidney disease, unspecified: Secondary | ICD-10-CM | POA: Diagnosis not present

## 2016-04-05 DIAGNOSIS — G3183 Dementia with Lewy bodies: Secondary | ICD-10-CM | POA: Diagnosis not present

## 2016-04-07 DIAGNOSIS — G3183 Dementia with Lewy bodies: Secondary | ICD-10-CM | POA: Diagnosis not present

## 2016-04-07 DIAGNOSIS — I359 Nonrheumatic aortic valve disorder, unspecified: Secondary | ICD-10-CM | POA: Diagnosis not present

## 2016-04-07 DIAGNOSIS — I429 Cardiomyopathy, unspecified: Secondary | ICD-10-CM | POA: Diagnosis not present

## 2016-04-07 DIAGNOSIS — N189 Chronic kidney disease, unspecified: Secondary | ICD-10-CM | POA: Diagnosis not present

## 2016-04-07 DIAGNOSIS — I679 Cerebrovascular disease, unspecified: Secondary | ICD-10-CM | POA: Diagnosis not present

## 2016-04-07 DIAGNOSIS — I4891 Unspecified atrial fibrillation: Secondary | ICD-10-CM | POA: Diagnosis not present

## 2016-04-09 DIAGNOSIS — I679 Cerebrovascular disease, unspecified: Secondary | ICD-10-CM | POA: Diagnosis not present

## 2016-04-09 DIAGNOSIS — I429 Cardiomyopathy, unspecified: Secondary | ICD-10-CM | POA: Diagnosis not present

## 2016-04-09 DIAGNOSIS — N189 Chronic kidney disease, unspecified: Secondary | ICD-10-CM | POA: Diagnosis not present

## 2016-04-09 DIAGNOSIS — G3183 Dementia with Lewy bodies: Secondary | ICD-10-CM | POA: Diagnosis not present

## 2016-04-09 DIAGNOSIS — I4891 Unspecified atrial fibrillation: Secondary | ICD-10-CM | POA: Diagnosis not present

## 2016-04-09 DIAGNOSIS — I359 Nonrheumatic aortic valve disorder, unspecified: Secondary | ICD-10-CM | POA: Diagnosis not present

## 2016-04-10 ENCOUNTER — Non-Acute Institutional Stay (SKILLED_NURSING_FACILITY): Payer: Medicare Other | Admitting: Internal Medicine

## 2016-04-10 DIAGNOSIS — G3183 Dementia with Lewy bodies: Secondary | ICD-10-CM | POA: Diagnosis not present

## 2016-04-10 DIAGNOSIS — G9009 Other idiopathic peripheral autonomic neuropathy: Secondary | ICD-10-CM | POA: Diagnosis not present

## 2016-04-10 DIAGNOSIS — E1159 Type 2 diabetes mellitus with other circulatory complications: Secondary | ICD-10-CM | POA: Diagnosis not present

## 2016-04-10 DIAGNOSIS — I10 Essential (primary) hypertension: Secondary | ICD-10-CM | POA: Diagnosis not present

## 2016-04-10 DIAGNOSIS — K219 Gastro-esophageal reflux disease without esophagitis: Secondary | ICD-10-CM | POA: Diagnosis not present

## 2016-04-10 DIAGNOSIS — I429 Cardiomyopathy, unspecified: Secondary | ICD-10-CM | POA: Diagnosis not present

## 2016-04-10 DIAGNOSIS — J309 Allergic rhinitis, unspecified: Secondary | ICD-10-CM | POA: Diagnosis not present

## 2016-04-10 DIAGNOSIS — R630 Anorexia: Secondary | ICD-10-CM | POA: Diagnosis not present

## 2016-04-10 DIAGNOSIS — I4891 Unspecified atrial fibrillation: Secondary | ICD-10-CM | POA: Diagnosis not present

## 2016-04-10 DIAGNOSIS — Z20828 Contact with and (suspected) exposure to other viral communicable diseases: Secondary | ICD-10-CM

## 2016-04-10 DIAGNOSIS — R634 Abnormal weight loss: Secondary | ICD-10-CM | POA: Diagnosis not present

## 2016-04-10 DIAGNOSIS — L8990 Pressure ulcer of unspecified site, unspecified stage: Secondary | ICD-10-CM | POA: Diagnosis not present

## 2016-04-10 DIAGNOSIS — M2450 Contracture, unspecified joint: Secondary | ICD-10-CM | POA: Diagnosis not present

## 2016-04-10 DIAGNOSIS — E785 Hyperlipidemia, unspecified: Secondary | ICD-10-CM | POA: Diagnosis not present

## 2016-04-10 DIAGNOSIS — N189 Chronic kidney disease, unspecified: Secondary | ICD-10-CM | POA: Diagnosis not present

## 2016-04-10 DIAGNOSIS — R1310 Dysphagia, unspecified: Secondary | ICD-10-CM | POA: Diagnosis not present

## 2016-04-10 DIAGNOSIS — F319 Bipolar disorder, unspecified: Secondary | ICD-10-CM | POA: Diagnosis not present

## 2016-04-10 DIAGNOSIS — I679 Cerebrovascular disease, unspecified: Secondary | ICD-10-CM | POA: Diagnosis not present

## 2016-04-10 DIAGNOSIS — I359 Nonrheumatic aortic valve disorder, unspecified: Secondary | ICD-10-CM | POA: Diagnosis not present

## 2016-04-11 DIAGNOSIS — I10 Essential (primary) hypertension: Secondary | ICD-10-CM | POA: Diagnosis not present

## 2016-04-11 LAB — BASIC METABOLIC PANEL
BUN: 14 mg/dL (ref 4–21)
Creatinine: 0.5 mg/dL (ref 0.5–1.1)
GLUCOSE: 129 mg/dL
POTASSIUM: 4.5 mmol/L (ref 3.4–5.3)
SODIUM: 137 mmol/L (ref 137–147)

## 2016-04-12 DIAGNOSIS — I359 Nonrheumatic aortic valve disorder, unspecified: Secondary | ICD-10-CM | POA: Diagnosis not present

## 2016-04-12 DIAGNOSIS — I679 Cerebrovascular disease, unspecified: Secondary | ICD-10-CM | POA: Diagnosis not present

## 2016-04-12 DIAGNOSIS — N189 Chronic kidney disease, unspecified: Secondary | ICD-10-CM | POA: Diagnosis not present

## 2016-04-12 DIAGNOSIS — G3183 Dementia with Lewy bodies: Secondary | ICD-10-CM | POA: Diagnosis not present

## 2016-04-12 DIAGNOSIS — I429 Cardiomyopathy, unspecified: Secondary | ICD-10-CM | POA: Diagnosis not present

## 2016-04-12 DIAGNOSIS — I4891 Unspecified atrial fibrillation: Secondary | ICD-10-CM | POA: Diagnosis not present

## 2016-04-14 DIAGNOSIS — I359 Nonrheumatic aortic valve disorder, unspecified: Secondary | ICD-10-CM | POA: Diagnosis not present

## 2016-04-14 DIAGNOSIS — I429 Cardiomyopathy, unspecified: Secondary | ICD-10-CM | POA: Diagnosis not present

## 2016-04-14 DIAGNOSIS — N189 Chronic kidney disease, unspecified: Secondary | ICD-10-CM | POA: Diagnosis not present

## 2016-04-14 DIAGNOSIS — G3183 Dementia with Lewy bodies: Secondary | ICD-10-CM | POA: Diagnosis not present

## 2016-04-14 DIAGNOSIS — I679 Cerebrovascular disease, unspecified: Secondary | ICD-10-CM | POA: Diagnosis not present

## 2016-04-14 DIAGNOSIS — I4891 Unspecified atrial fibrillation: Secondary | ICD-10-CM | POA: Diagnosis not present

## 2016-04-16 DIAGNOSIS — I679 Cerebrovascular disease, unspecified: Secondary | ICD-10-CM | POA: Diagnosis not present

## 2016-04-16 DIAGNOSIS — N189 Chronic kidney disease, unspecified: Secondary | ICD-10-CM | POA: Diagnosis not present

## 2016-04-16 DIAGNOSIS — I4891 Unspecified atrial fibrillation: Secondary | ICD-10-CM | POA: Diagnosis not present

## 2016-04-16 DIAGNOSIS — I359 Nonrheumatic aortic valve disorder, unspecified: Secondary | ICD-10-CM | POA: Diagnosis not present

## 2016-04-16 DIAGNOSIS — G3183 Dementia with Lewy bodies: Secondary | ICD-10-CM | POA: Diagnosis not present

## 2016-04-16 DIAGNOSIS — I429 Cardiomyopathy, unspecified: Secondary | ICD-10-CM | POA: Diagnosis not present

## 2016-04-18 ENCOUNTER — Non-Acute Institutional Stay (SKILLED_NURSING_FACILITY): Payer: Medicare Other | Admitting: Internal Medicine

## 2016-04-18 ENCOUNTER — Encounter: Payer: Self-pay | Admitting: Internal Medicine

## 2016-04-18 DIAGNOSIS — I480 Paroxysmal atrial fibrillation: Secondary | ICD-10-CM

## 2016-04-18 DIAGNOSIS — G3183 Dementia with Lewy bodies: Secondary | ICD-10-CM | POA: Diagnosis not present

## 2016-04-18 DIAGNOSIS — N183 Chronic kidney disease, stage 3 unspecified: Secondary | ICD-10-CM

## 2016-04-18 DIAGNOSIS — F419 Anxiety disorder, unspecified: Secondary | ICD-10-CM

## 2016-04-18 DIAGNOSIS — I359 Nonrheumatic aortic valve disorder, unspecified: Secondary | ICD-10-CM | POA: Diagnosis not present

## 2016-04-18 DIAGNOSIS — N189 Chronic kidney disease, unspecified: Secondary | ICD-10-CM | POA: Diagnosis not present

## 2016-04-18 DIAGNOSIS — I429 Cardiomyopathy, unspecified: Secondary | ICD-10-CM | POA: Diagnosis not present

## 2016-04-18 DIAGNOSIS — I4891 Unspecified atrial fibrillation: Secondary | ICD-10-CM | POA: Diagnosis not present

## 2016-04-18 DIAGNOSIS — I679 Cerebrovascular disease, unspecified: Secondary | ICD-10-CM | POA: Diagnosis not present

## 2016-04-18 NOTE — Progress Notes (Signed)
Location:  [place] Room Number: 202D Place of Service:  SNF [phone])  [name], MD  Patient Care Team: Hennie Duos, MD as PCP - General (Internal Medicine)  Extended Emergency Contact Information Primary Emergency Contact: Zaccaro,James Address: [address] [place], [place] [address] [place] Phone: [phone] Relation: Son Secondary Emergency Contact: Rudell Cobb [place] Phone: [phone] Relation: Other    Allergies: Cymbalta [duloxetine hcl]; Lipitor [atorvastatin]; Metformin and related; Simvastatin; and Verapamil  Chief Complaint  Patient presents with  . Medical Management of Chronic Issues    Routine Visit    HPI: Patient is 83 y.o. female who is being seen for routine issues of anxiety, AF and CKD3.  Past Medical History:  Diagnosis Date  . Alzheimer disease   . Atrial fibrillation ([place])   . Chronic kidney disease   . Colon polyp   . Diabetes mellitus without complication ([place])   . Fibromyalgia   . GERD (gastroesophageal reflux disease)   . HOCM (hypertrophic obstructive cardiomyopathy) ([place])   . Hypertension   . Neuropathy [facility])     Past Surgical History:  Procedure Laterality Date  . SHOULDER SURGERY Left    rotator cuff    Allergies as of 04/18/2016      Reactions   Cymbalta [duloxetine Hcl] Other (See Comments)   Diarrhea Listed on MAR   Lipitor [atorvastatin] Other (See Comments)   myalgias Listed on MAR   Metformin And Related Other (See Comments)   Listed on MAR   Simvastatin Other (See Comments)   Listed on MAR   Verapamil    Sinus arrest      Medication List       Accurate as of 04/18/16 11:59 PM. Always use your most recent med list.          amLODipine 5 MG tablet Commonly known as:  NORVASC Take 5 mg by mouth daily.   aspirin 81 MG chewable tablet Chew 324 mg by mouth daily. 4 tablets   ATIVAN 0.5 MG tablet Generic  drug:  LORazepam Take 0.25 mg by mouth every 8 (eight) hours as needed for anxiety.   divalproex 125 MG capsule Commonly known as:  DEPAKOTE SPRINKLE Take 2 capsules (250mg ) by mouth each morning and take 3 capsules (375 mg) by mouth every evening.   docusate sodium 100 MG capsule Commonly known as:  COLACE Take 1 capsule (100 mg total) by mouth daily.   escitalopram 5 MG tablet Commonly known as:  LEXAPRO Take 10 mg by mouth daily. 2 tablets   feeding supplement (ENSURE ENLIVE) Liqd Take 237 mLs by mouth 4 (four) times daily -  before meals and at bedtime.   fluticasone 50 MCG/ACT nasal spray Commonly known as:  FLONASE Place 2 sprays into both nostrils daily.   HYDROcodone-acetaminophen 5-325 MG tablet Commonly known as:  NORCO/VICODIN Take 1 tablet by mouth every 4 (four) hours as needed for moderate pain.   loratadine 10 MG tablet Commonly known as:  CLARITIN Take 10 mg by mouth daily as needed for allergies.   metoprolol tartrate 25 mg/10 mL Susp Commonly known as:  LOPRESSOR Give 15 mls (75 mg) by mouth twice daily   omeprazole 40 MG capsule Commonly known as:  PRILOSEC Take 40 mg by mouth daily.   sennosides-docusate sodium 8.6-50 MG tablet Commonly known as:  SENOKOT-S Take 1 tablet by mouth  at bedtime.   THERA VITAMIN PO Take 1 tablet by mouth daily.   UNABLE TO FIND Med Name: Med Pass initiate 4 oz  three times daily       No orders of the defined types were placed in this encounter.   Immunization History  Administered Date(s) Administered  . Influenza Inj Mdck Quad Pf 12/22/2013  . Influenza, Seasonal, Injecte, Preservative Fre 12/02/2010  . Influenza-Unspecified 12/06/2015  . PPD Test 11/07/2014, 06/08/2015, 06/25/2015  . Pneumococcal Conjugate-13 03/10/1998  . Pneumococcal Polysaccharide-23 10/23/2010  . Zoster 05/05/2007    Social History  Substance Use Topics  . Smoking status: Never Smoker  . Smokeless tobacco: Never Used  .  Alcohol use No    Review of Systems  UTO 2/2 dementia    Vitals:   04/18/16 1210  BP: (!) 149/73  Pulse: 88  Resp: 18  Temp: 97 F (36.1 C)   Body mass index is 17.51 kg/m. Physical Exam  GENERAL APPEARANCE: Alert,  No acute distress, can answer yes or no   SKIN: No diaphoresis rash HEENT: Unremarkable RESPIRATORY: Breathing is even, unlabored. Lung sounds are clear   CARDIOVASCULAR: Heart RRR 2/6 systolic murmur, no  rubs or gallops. No peripheral edema  GASTROINTESTINAL: Abdomen is soft, non-tender, not distended w/ normal bowel sounds.  GENITOURINARY: Bladder non tender, not distended  MUSCULOSKELETAL: very thin with contractures NEUROLOGIC: Cranial nerves 2-12 grossly intact. Moves all extremities PSYCHIATRIC: very sweet, dementia, no behavioral issues  Patient Active Problem List   Diagnosis Date Noted  . Skin ulcers of both feet ([place]) 10/23/2015  . Vitamin B12 deficiency 10/23/2015  . FTT (failure to thrive) in adult 09/01/2015  . Lewy body dementia 09/01/2015  . Corn or callus 02/06/2015  . Dizziness 02/06/2015  . Neuropathy ([place]) 02/06/2015  . Secondary parkinsonism ([place])   . Depression   . Anxiety   . Essential hypertension   . HLD (hyperlipidemia)   . Nonorganic psychosis   . Atrial fibrillation ([place]) 01/12/2015  . Sepsis ([place]) 01/12/2015  . Ischemic stroke ([place]) 01/12/2015  . Acute encephalopathy   . Paroxysmal atrial fibrillation (HCC)   . Schizophrenia ([place])   . Atrial fibrillation with RVR ([place]) 01/11/2015  . SVT (supraventricular tachycardia) ([place]) 01/11/2015  . Alzheimer disease   . Diabetes mellitus without complication ([place])   . HOCM (hypertrophic obstructive cardiomyopathy) ([place])   . HTN (hypertension) 01/07/2015  . Edema 01/06/2015  . Blister of skin without infection 01/05/2015  . Parkinsonian features 12/25/2014  . Weakness 12/18/2014  . Altered mental status 12/11/2014  . UTI (urinary tract infection) 12/02/2014  . GERD  (gastroesophageal reflux disease) 12/02/2014  . Psychosis 12/02/2014  . Renal failure (ARF), acute on chronic (HCC) 11/24/2014  . MDD (major depressive disorder) 11/24/2014  . Pressure ulcer 11/24/2014  . Elevated troponin 11/23/2014  . Difficulty hearing 11/07/2014  . Alzheimer's dementia with behavioral disturbance 11/03/2014  . Severe episode of recurrent major depressive disorder, without psychotic features ([place]) 11/02/2014  . Chronic kidney disease (CKD), stage III (moderate) 09/17/2014  . Rotator cuff arthropathy 07/21/2014  . Diabetic polyneuropathy ([place]) 05/21/2014  . Acid reflux 05/21/2014  . Aortic heart valve narrowing 02/24/2014  . Acquired deformities of toe 07/12/2013  . Hypertrophic obstructive cardiomyopathy ([place]) 07/04/2013  . Left ventricular outflow obstruction 05/31/2012  . Left ventricular hypertrophy 05/07/2011  . Sick sinus syndrome ([place]) 05/07/2011  . Cardiac conduction disorder 05/01/2011  . Diabetes mellitus ([place]) 05/01/2011  . Degeneration of intervertebral disc of lumbosacral  region 01/27/2011  . Chronic kidney disease 12/16/2010  . Absence of bladder continence 12/16/2010  . Diverticulitis 06/03/2010  . Fibromyalgia 06/03/2010  . Arthritis, degenerative 06/03/2010  . Adenomatous colon polyp 08/03/2006    CMP     Component Value Date/Time   NA 137 04/11/2016   K 4.5 04/11/2016   CL 113 (H) 01/15/2015 0518   CO2 18 (L) 01/15/2015 0518   GLUCOSE 139 (H) 01/15/2015 0518   BUN 14 04/11/2016   CREATININE 0.5 04/11/2016   CREATININE 0.69 01/15/2015 0518   CALCIUM 8.9 01/15/2015 0518   PROT 4.9 (L) 01/14/2015 0154   ALBUMIN 2.0 (L) 01/14/2015 0154   AST 25 01/14/2015 0154   ALT 31 01/14/2015 0154   ALKPHOS 93 01/14/2015 0154   BILITOT 1.1 01/14/2015 0154   GFRNONAA >60 01/15/2015 0518   GFRAA >60 01/15/2015 0518    Recent Labs  04/11/16  NA 137  K 4.5  BUN 14  CREATININE 0.5   No results for input(s): AST, ALT, ALKPHOS, BILITOT, PROT,  ALBUMIN in the last 8760 hours. No results for input(s): WBC, NEUTROABS, HGB, HCT, MCV, PLT in the last 8760 hours. No results for input(s): CHOL, LDLCALC, TRIG in the last 8760 hours.  Invalid input(s): HCL No results found for: [facility] Lab Results  Component Value Date   TSH 3.421 01/12/2015   Lab Results  Component Value Date   HGBA1C 6.1 (H) 01/12/2015   Lab Results  Component Value Date   CHOL 217 (H) 01/12/2015   HDL 20 (L) 01/12/2015   LDLCALC 156 (H) 01/12/2015   TRIG 207 (H) 01/12/2015   CHOLHDL 10.9 01/12/2015    Significant Diagnostic Results in last 30 days:  No results found.  Assessment and Plan  Anxiety Pt has no apparent anxiety; she seems content as it were ;pt has prn ativan  Atrial fibrillation (HCC) Chronic,stable;plan to cont metoprolol 75 mg BID and prophylaxis with ASA 81 mg daily  Chronic kidney disease (CKD), stage III (moderate) Recent BUN/Cr is 14/0.5; no recent GFR; pt is declining steadily; may not need to follow any longer      Anne D. Sheppard Coil, MD

## 2016-04-21 DIAGNOSIS — I359 Nonrheumatic aortic valve disorder, unspecified: Secondary | ICD-10-CM | POA: Diagnosis not present

## 2016-04-21 DIAGNOSIS — I429 Cardiomyopathy, unspecified: Secondary | ICD-10-CM | POA: Diagnosis not present

## 2016-04-21 DIAGNOSIS — N189 Chronic kidney disease, unspecified: Secondary | ICD-10-CM | POA: Diagnosis not present

## 2016-04-21 DIAGNOSIS — I4891 Unspecified atrial fibrillation: Secondary | ICD-10-CM | POA: Diagnosis not present

## 2016-04-21 DIAGNOSIS — I679 Cerebrovascular disease, unspecified: Secondary | ICD-10-CM | POA: Diagnosis not present

## 2016-04-21 DIAGNOSIS — G3183 Dementia with Lewy bodies: Secondary | ICD-10-CM | POA: Diagnosis not present

## 2016-04-22 DIAGNOSIS — I429 Cardiomyopathy, unspecified: Secondary | ICD-10-CM | POA: Diagnosis not present

## 2016-04-22 DIAGNOSIS — I679 Cerebrovascular disease, unspecified: Secondary | ICD-10-CM | POA: Diagnosis not present

## 2016-04-22 DIAGNOSIS — N189 Chronic kidney disease, unspecified: Secondary | ICD-10-CM | POA: Diagnosis not present

## 2016-04-22 DIAGNOSIS — G3183 Dementia with Lewy bodies: Secondary | ICD-10-CM | POA: Diagnosis not present

## 2016-04-22 DIAGNOSIS — I359 Nonrheumatic aortic valve disorder, unspecified: Secondary | ICD-10-CM | POA: Diagnosis not present

## 2016-04-22 DIAGNOSIS — I4891 Unspecified atrial fibrillation: Secondary | ICD-10-CM | POA: Diagnosis not present

## 2016-04-23 DIAGNOSIS — I429 Cardiomyopathy, unspecified: Secondary | ICD-10-CM | POA: Diagnosis not present

## 2016-04-23 DIAGNOSIS — G3183 Dementia with Lewy bodies: Secondary | ICD-10-CM | POA: Diagnosis not present

## 2016-04-23 DIAGNOSIS — N189 Chronic kidney disease, unspecified: Secondary | ICD-10-CM | POA: Diagnosis not present

## 2016-04-23 DIAGNOSIS — I679 Cerebrovascular disease, unspecified: Secondary | ICD-10-CM | POA: Diagnosis not present

## 2016-04-23 DIAGNOSIS — I4891 Unspecified atrial fibrillation: Secondary | ICD-10-CM | POA: Diagnosis not present

## 2016-04-23 DIAGNOSIS — I359 Nonrheumatic aortic valve disorder, unspecified: Secondary | ICD-10-CM | POA: Diagnosis not present

## 2016-04-25 DIAGNOSIS — G3183 Dementia with Lewy bodies: Secondary | ICD-10-CM | POA: Diagnosis not present

## 2016-04-25 DIAGNOSIS — N189 Chronic kidney disease, unspecified: Secondary | ICD-10-CM | POA: Diagnosis not present

## 2016-04-25 DIAGNOSIS — I4891 Unspecified atrial fibrillation: Secondary | ICD-10-CM | POA: Diagnosis not present

## 2016-04-25 DIAGNOSIS — I679 Cerebrovascular disease, unspecified: Secondary | ICD-10-CM | POA: Diagnosis not present

## 2016-04-25 DIAGNOSIS — I359 Nonrheumatic aortic valve disorder, unspecified: Secondary | ICD-10-CM | POA: Diagnosis not present

## 2016-04-25 DIAGNOSIS — I429 Cardiomyopathy, unspecified: Secondary | ICD-10-CM | POA: Diagnosis not present

## 2016-04-28 DIAGNOSIS — I359 Nonrheumatic aortic valve disorder, unspecified: Secondary | ICD-10-CM | POA: Diagnosis not present

## 2016-04-28 DIAGNOSIS — I4891 Unspecified atrial fibrillation: Secondary | ICD-10-CM | POA: Diagnosis not present

## 2016-04-28 DIAGNOSIS — G3183 Dementia with Lewy bodies: Secondary | ICD-10-CM | POA: Diagnosis not present

## 2016-04-28 DIAGNOSIS — N189 Chronic kidney disease, unspecified: Secondary | ICD-10-CM | POA: Diagnosis not present

## 2016-04-28 DIAGNOSIS — I429 Cardiomyopathy, unspecified: Secondary | ICD-10-CM | POA: Diagnosis not present

## 2016-04-28 DIAGNOSIS — I679 Cerebrovascular disease, unspecified: Secondary | ICD-10-CM | POA: Diagnosis not present

## 2016-04-28 IMAGING — MR MR HEAD W/O CM
7 of 10 series · 40 of 48 positions shown · non-contrast
Comparison: CT head December 18, 2014

CLINICAL DATA: Diaphoresis and tachycardia. History dementia,
kidney disease.

EXAM:
MRI HEAD WITHOUT CONTRAST
TECHNIQUE: Multiplanar, multiecho pulse sequences of the brain and surrounding
structures were obtained without intravenous contrast.

[Series 5: DWI · axial · 3.0mm · 1.09mm/px · z∈[+14,+158]mm · 11 of 100 slices shown (1 of 4)]
[im 1/100]
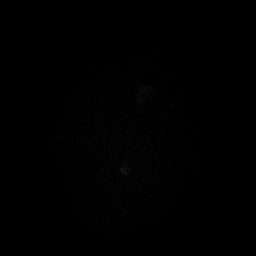
[im 10/100]
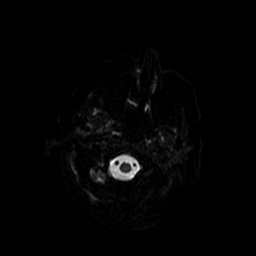
[im 20/100]
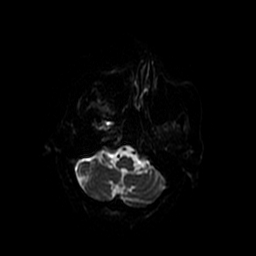
[im 30/100]
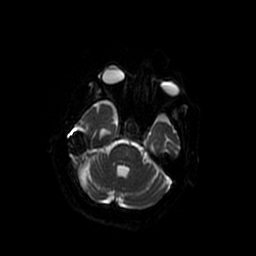
[im 40/100]
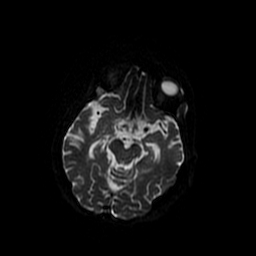
[im 50/100]
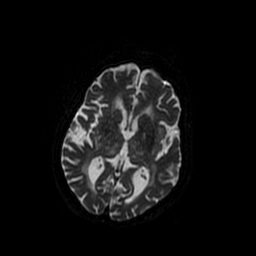
[im 60/100]
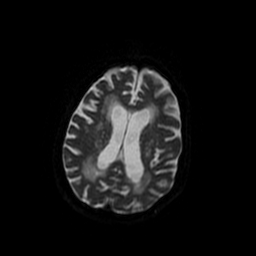
[im 70/100]
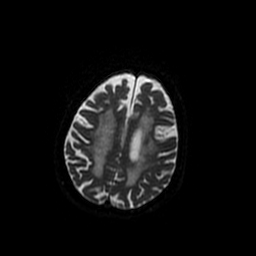
[im 80/100]
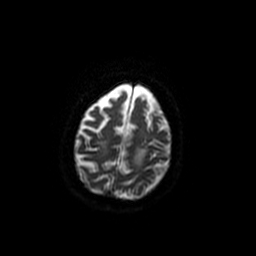
[im 90/100]
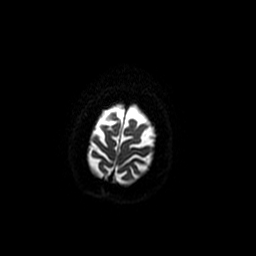
[im 100/100]
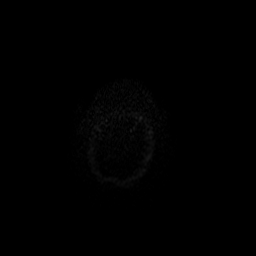

[Series 6: T2 · axial · 5.0mm · 0.43mm/px · z∈[+8,+149]mm · 3 of 25 slices shown (1 of 2)]
[im 1/25]
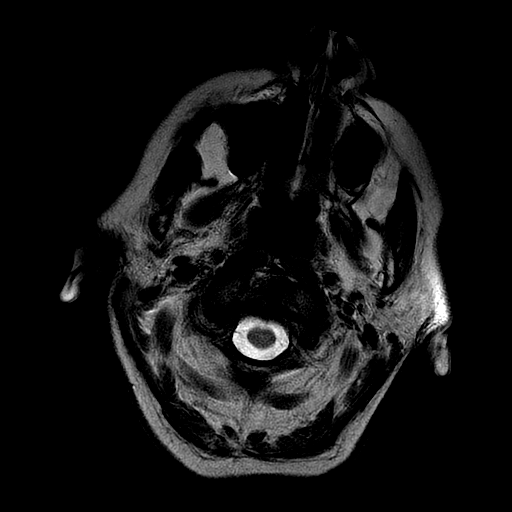
[im 13/25]
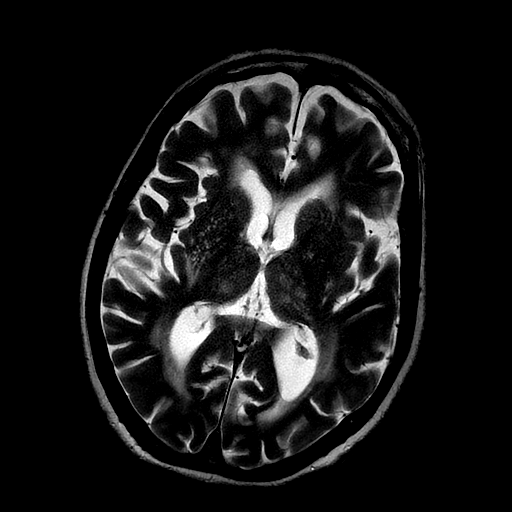
[im 25/25]
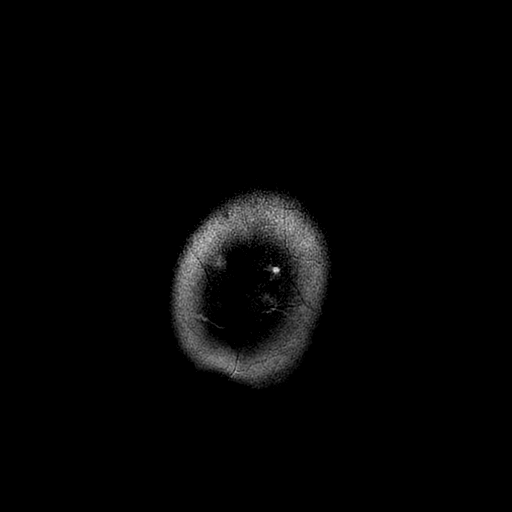

[Series 7: FLAIR · axial · 5.0mm · 0.43mm/px · z∈[+8,+149]mm · 3 of 25 slices shown]
[im 1/25]
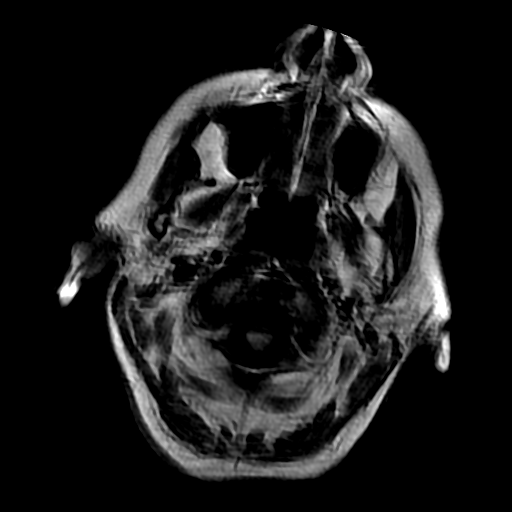
[im 13/25]
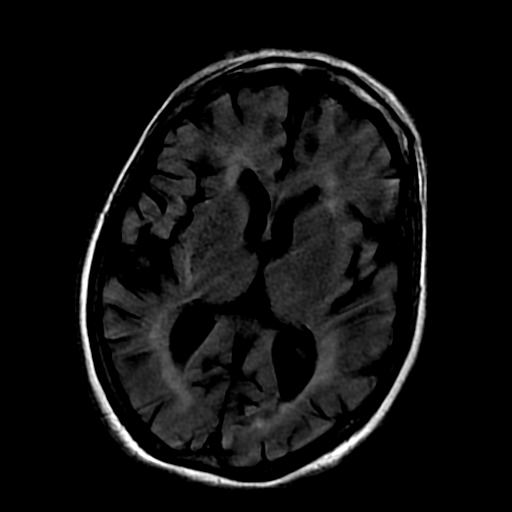
[im 25/25]
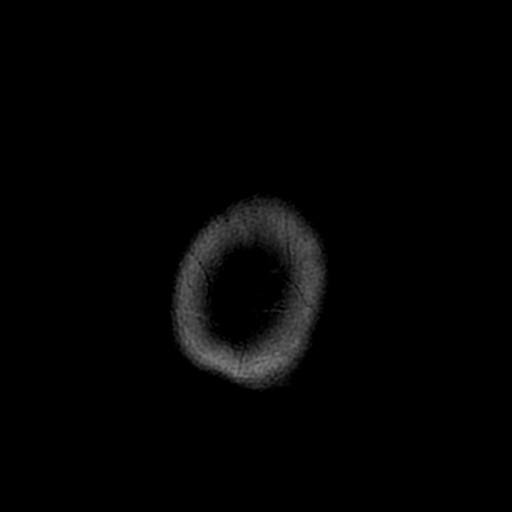

[Series 8: DWI · coronal · 5.0mm · 1.09mm/px · 9 of 74 slices shown (2 of 4)]
[im 1/74]
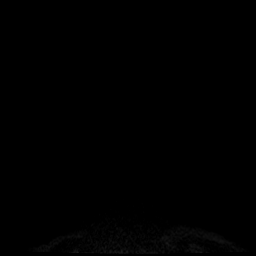
[im 10/74]
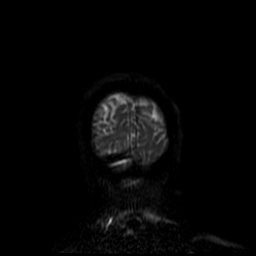
[im 19/74]
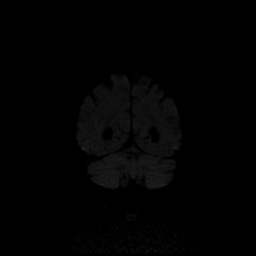
[im 28/74]
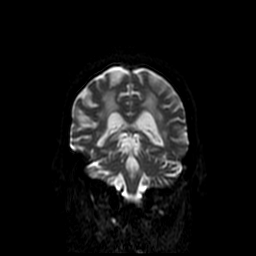
[im 37/74]
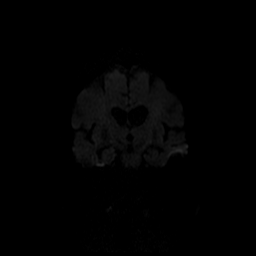
[im 46/74]
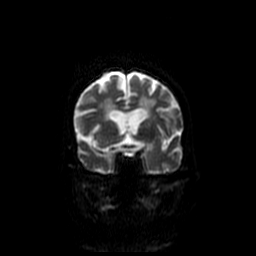
[im 55/74]
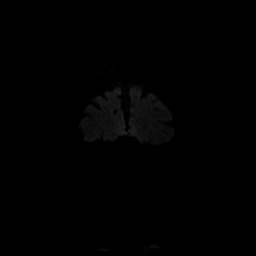
[im 64/74]
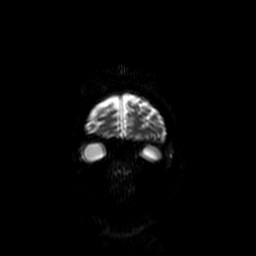
[im 74/74]
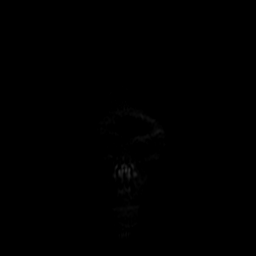

[Series 10: T2 · coronal · 5.0mm · 0.43mm/px · 4 of 31 slices shown (2 of 2)]
[im 1/31]
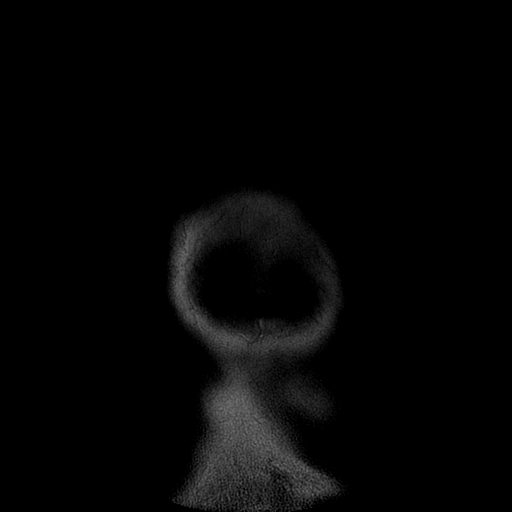
[im 11/31]
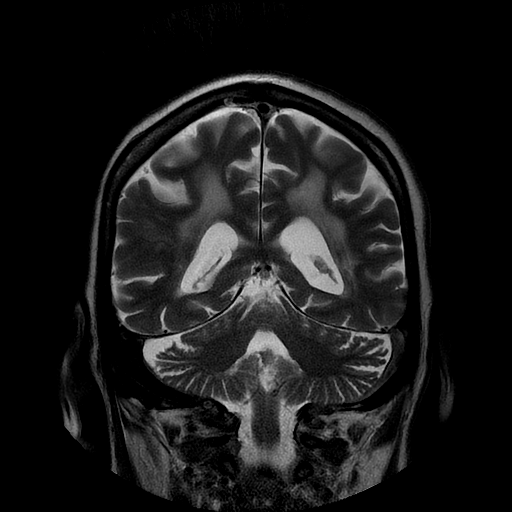
[im 21/31]
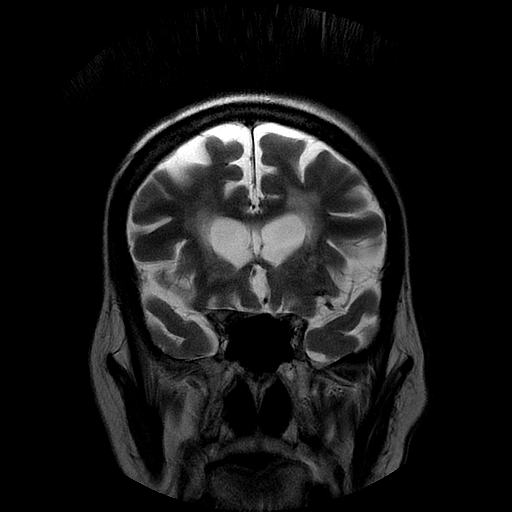
[im 31/31]
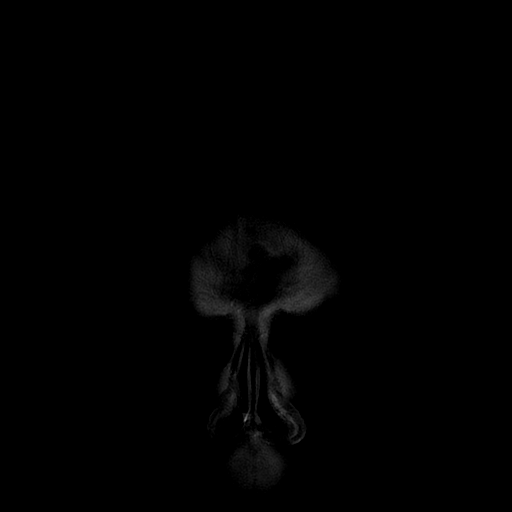

[Series 500: DWI · axial · 3.0mm · 1.09mm/px · z∈[+14,+158]mm · 6 of 50 slices shown (3 of 4)]
[im 1/50]
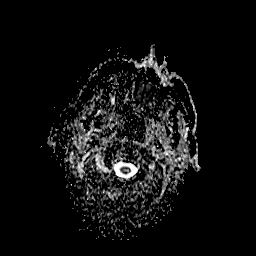
[im 10/50]
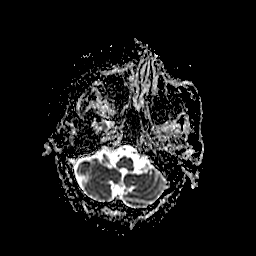
[im 20/50]
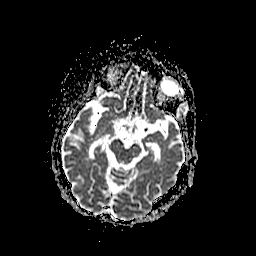
[im 30/50]
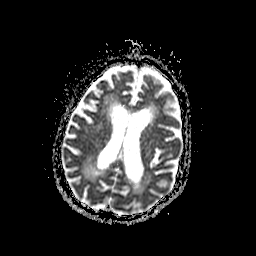
[im 40/50]
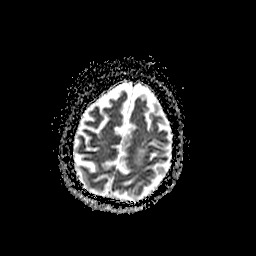
[im 50/50]
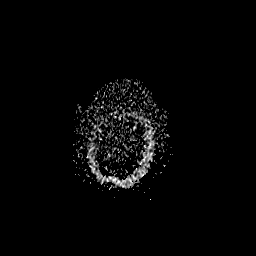

[Series 800: DWI · coronal · 5.0mm · 1.09mm/px · 4 of 37 slices shown (4 of 4)]
[im 1/37]
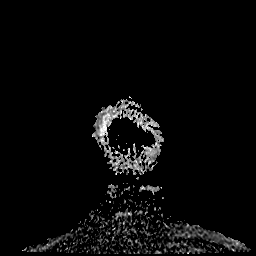
[im 13/37]
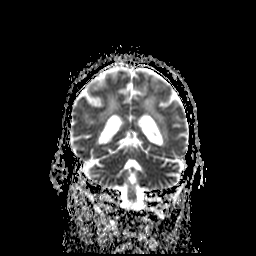
[im 25/37]
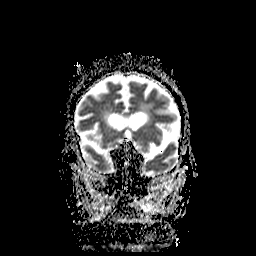
[im 37/37]
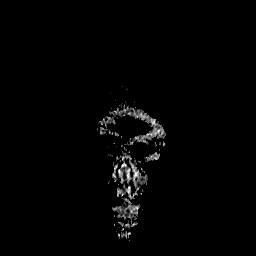

[40 of 48 positions shown; findings below may reference images not displayed]

FINDINGS: 7 mm focus of reduced diffusion RIGHT posterior frontal lobe, with
corresponding low ADC value. No susceptibility artifact to suggest
hemorrhage. Ventricles and sulci are overall normal for patient's
age. Confluent supratentorial and pontine white matter T2
hyperintensities. Tiny old bilateral inferior cerebellar infarcts.
Patchy T2 hyperintense old old the infarcts in the thalamus in
addition to perivascular spaces and lacunar infarcts in basal
ganglia. No midline shift, mass effect or mass lesions.

No abnormal extra-axial fluid collections. No extra-axial masses
though, contrast enhanced sequences would be more sensitive. Normal
major intracranial vascular flow voids seen at the skull base.
Dolicoectatic intracranial vessels most compatible with chronic
hypertension.

Status post bilateral ocular lens implants. No abnormal sellar
expansion. Visualized paranasal sinuses and mastoid air cells are
well-aerated. No suspicious calvarial bone marrow signal.
Craniocervical junction maintained.
IMPRESSION: 7 mm acute infarct RIGHT posterior frontal lobe.

Chronic changes including severe chronic small vessel ischemic
disease and old bilateral basal ganglia/ thalamus lacunar infarcts,
tiny inferior cerebellar lacunar infarcts.

## 2016-04-30 DIAGNOSIS — N189 Chronic kidney disease, unspecified: Secondary | ICD-10-CM | POA: Diagnosis not present

## 2016-04-30 DIAGNOSIS — G3183 Dementia with Lewy bodies: Secondary | ICD-10-CM | POA: Diagnosis not present

## 2016-04-30 DIAGNOSIS — I679 Cerebrovascular disease, unspecified: Secondary | ICD-10-CM | POA: Diagnosis not present

## 2016-04-30 DIAGNOSIS — I4891 Unspecified atrial fibrillation: Secondary | ICD-10-CM | POA: Diagnosis not present

## 2016-04-30 DIAGNOSIS — I429 Cardiomyopathy, unspecified: Secondary | ICD-10-CM | POA: Diagnosis not present

## 2016-04-30 DIAGNOSIS — I359 Nonrheumatic aortic valve disorder, unspecified: Secondary | ICD-10-CM | POA: Diagnosis not present

## 2016-05-01 DIAGNOSIS — I4891 Unspecified atrial fibrillation: Secondary | ICD-10-CM | POA: Diagnosis not present

## 2016-05-01 DIAGNOSIS — N189 Chronic kidney disease, unspecified: Secondary | ICD-10-CM | POA: Diagnosis not present

## 2016-05-01 DIAGNOSIS — I429 Cardiomyopathy, unspecified: Secondary | ICD-10-CM | POA: Diagnosis not present

## 2016-05-01 DIAGNOSIS — I359 Nonrheumatic aortic valve disorder, unspecified: Secondary | ICD-10-CM | POA: Diagnosis not present

## 2016-05-01 DIAGNOSIS — G3183 Dementia with Lewy bodies: Secondary | ICD-10-CM | POA: Diagnosis not present

## 2016-05-01 DIAGNOSIS — I679 Cerebrovascular disease, unspecified: Secondary | ICD-10-CM | POA: Diagnosis not present

## 2016-05-02 DIAGNOSIS — G3183 Dementia with Lewy bodies: Secondary | ICD-10-CM | POA: Diagnosis not present

## 2016-05-02 DIAGNOSIS — N189 Chronic kidney disease, unspecified: Secondary | ICD-10-CM | POA: Diagnosis not present

## 2016-05-02 DIAGNOSIS — I4891 Unspecified atrial fibrillation: Secondary | ICD-10-CM | POA: Diagnosis not present

## 2016-05-02 DIAGNOSIS — I429 Cardiomyopathy, unspecified: Secondary | ICD-10-CM | POA: Diagnosis not present

## 2016-05-02 DIAGNOSIS — I679 Cerebrovascular disease, unspecified: Secondary | ICD-10-CM | POA: Diagnosis not present

## 2016-05-02 DIAGNOSIS — I359 Nonrheumatic aortic valve disorder, unspecified: Secondary | ICD-10-CM | POA: Diagnosis not present

## 2016-05-04 ENCOUNTER — Encounter: Payer: Self-pay | Admitting: Internal Medicine

## 2016-05-04 NOTE — Assessment & Plan Note (Signed)
Pt has no apparent anxiety; she seems content as it were ;pt has prn ativan

## 2016-05-04 NOTE — Assessment & Plan Note (Signed)
Chronic,stable;plan to cont metoprolol 75 mg BID and prophylaxis with ASA 81 mg daily

## 2016-05-04 NOTE — Assessment & Plan Note (Signed)
Recent BUN/Cr is 14/0.5; no recent GFR; pt is declining steadily; may not need to follow any longer

## 2016-05-05 DIAGNOSIS — G3183 Dementia with Lewy bodies: Secondary | ICD-10-CM | POA: Diagnosis not present

## 2016-05-05 DIAGNOSIS — N189 Chronic kidney disease, unspecified: Secondary | ICD-10-CM | POA: Diagnosis not present

## 2016-05-05 DIAGNOSIS — I359 Nonrheumatic aortic valve disorder, unspecified: Secondary | ICD-10-CM | POA: Diagnosis not present

## 2016-05-05 DIAGNOSIS — I679 Cerebrovascular disease, unspecified: Secondary | ICD-10-CM | POA: Diagnosis not present

## 2016-05-05 DIAGNOSIS — I4891 Unspecified atrial fibrillation: Secondary | ICD-10-CM | POA: Diagnosis not present

## 2016-05-05 DIAGNOSIS — I429 Cardiomyopathy, unspecified: Secondary | ICD-10-CM | POA: Diagnosis not present

## 2016-05-07 DIAGNOSIS — I679 Cerebrovascular disease, unspecified: Secondary | ICD-10-CM | POA: Diagnosis not present

## 2016-05-07 DIAGNOSIS — I359 Nonrheumatic aortic valve disorder, unspecified: Secondary | ICD-10-CM | POA: Diagnosis not present

## 2016-05-07 DIAGNOSIS — N189 Chronic kidney disease, unspecified: Secondary | ICD-10-CM | POA: Diagnosis not present

## 2016-05-07 DIAGNOSIS — I4891 Unspecified atrial fibrillation: Secondary | ICD-10-CM | POA: Diagnosis not present

## 2016-05-07 DIAGNOSIS — G3183 Dementia with Lewy bodies: Secondary | ICD-10-CM | POA: Diagnosis not present

## 2016-05-07 DIAGNOSIS — I429 Cardiomyopathy, unspecified: Secondary | ICD-10-CM | POA: Diagnosis not present

## 2016-05-08 DIAGNOSIS — R630 Anorexia: Secondary | ICD-10-CM | POA: Diagnosis not present

## 2016-05-08 DIAGNOSIS — I679 Cerebrovascular disease, unspecified: Secondary | ICD-10-CM | POA: Diagnosis not present

## 2016-05-08 DIAGNOSIS — E1159 Type 2 diabetes mellitus with other circulatory complications: Secondary | ICD-10-CM | POA: Diagnosis not present

## 2016-05-08 DIAGNOSIS — J309 Allergic rhinitis, unspecified: Secondary | ICD-10-CM | POA: Diagnosis not present

## 2016-05-08 DIAGNOSIS — R1310 Dysphagia, unspecified: Secondary | ICD-10-CM | POA: Diagnosis not present

## 2016-05-08 DIAGNOSIS — K219 Gastro-esophageal reflux disease without esophagitis: Secondary | ICD-10-CM | POA: Diagnosis not present

## 2016-05-08 DIAGNOSIS — G9009 Other idiopathic peripheral autonomic neuropathy: Secondary | ICD-10-CM | POA: Diagnosis not present

## 2016-05-08 DIAGNOSIS — G3183 Dementia with Lewy bodies: Secondary | ICD-10-CM | POA: Diagnosis not present

## 2016-05-08 DIAGNOSIS — N189 Chronic kidney disease, unspecified: Secondary | ICD-10-CM | POA: Diagnosis not present

## 2016-05-08 DIAGNOSIS — L8990 Pressure ulcer of unspecified site, unspecified stage: Secondary | ICD-10-CM | POA: Diagnosis not present

## 2016-05-08 DIAGNOSIS — M2450 Contracture, unspecified joint: Secondary | ICD-10-CM | POA: Diagnosis not present

## 2016-05-08 DIAGNOSIS — E785 Hyperlipidemia, unspecified: Secondary | ICD-10-CM | POA: Diagnosis not present

## 2016-05-08 DIAGNOSIS — I10 Essential (primary) hypertension: Secondary | ICD-10-CM | POA: Diagnosis not present

## 2016-05-08 DIAGNOSIS — F319 Bipolar disorder, unspecified: Secondary | ICD-10-CM | POA: Diagnosis not present

## 2016-05-08 DIAGNOSIS — I4891 Unspecified atrial fibrillation: Secondary | ICD-10-CM | POA: Diagnosis not present

## 2016-05-08 DIAGNOSIS — I359 Nonrheumatic aortic valve disorder, unspecified: Secondary | ICD-10-CM | POA: Diagnosis not present

## 2016-05-08 DIAGNOSIS — R634 Abnormal weight loss: Secondary | ICD-10-CM | POA: Diagnosis not present

## 2016-05-08 DIAGNOSIS — I429 Cardiomyopathy, unspecified: Secondary | ICD-10-CM | POA: Diagnosis not present

## 2016-05-09 DIAGNOSIS — I429 Cardiomyopathy, unspecified: Secondary | ICD-10-CM | POA: Diagnosis not present

## 2016-05-09 DIAGNOSIS — I4891 Unspecified atrial fibrillation: Secondary | ICD-10-CM | POA: Diagnosis not present

## 2016-05-09 DIAGNOSIS — G3183 Dementia with Lewy bodies: Secondary | ICD-10-CM | POA: Diagnosis not present

## 2016-05-09 DIAGNOSIS — N189 Chronic kidney disease, unspecified: Secondary | ICD-10-CM | POA: Diagnosis not present

## 2016-05-09 DIAGNOSIS — I679 Cerebrovascular disease, unspecified: Secondary | ICD-10-CM | POA: Diagnosis not present

## 2016-05-09 DIAGNOSIS — I359 Nonrheumatic aortic valve disorder, unspecified: Secondary | ICD-10-CM | POA: Diagnosis not present

## 2016-05-10 DIAGNOSIS — I679 Cerebrovascular disease, unspecified: Secondary | ICD-10-CM | POA: Diagnosis not present

## 2016-05-10 DIAGNOSIS — I359 Nonrheumatic aortic valve disorder, unspecified: Secondary | ICD-10-CM | POA: Diagnosis not present

## 2016-05-10 DIAGNOSIS — G3183 Dementia with Lewy bodies: Secondary | ICD-10-CM | POA: Diagnosis not present

## 2016-05-10 DIAGNOSIS — N189 Chronic kidney disease, unspecified: Secondary | ICD-10-CM | POA: Diagnosis not present

## 2016-05-10 DIAGNOSIS — I4891 Unspecified atrial fibrillation: Secondary | ICD-10-CM | POA: Diagnosis not present

## 2016-05-10 DIAGNOSIS — I429 Cardiomyopathy, unspecified: Secondary | ICD-10-CM | POA: Diagnosis not present

## 2016-05-12 DIAGNOSIS — I4891 Unspecified atrial fibrillation: Secondary | ICD-10-CM | POA: Diagnosis not present

## 2016-05-12 DIAGNOSIS — I429 Cardiomyopathy, unspecified: Secondary | ICD-10-CM | POA: Diagnosis not present

## 2016-05-12 DIAGNOSIS — G3183 Dementia with Lewy bodies: Secondary | ICD-10-CM | POA: Diagnosis not present

## 2016-05-12 DIAGNOSIS — I679 Cerebrovascular disease, unspecified: Secondary | ICD-10-CM | POA: Diagnosis not present

## 2016-05-12 DIAGNOSIS — I359 Nonrheumatic aortic valve disorder, unspecified: Secondary | ICD-10-CM | POA: Diagnosis not present

## 2016-05-12 DIAGNOSIS — N189 Chronic kidney disease, unspecified: Secondary | ICD-10-CM | POA: Diagnosis not present

## 2016-05-14 ENCOUNTER — Non-Acute Institutional Stay (SKILLED_NURSING_FACILITY): Payer: Medicare Other | Admitting: Internal Medicine

## 2016-05-14 ENCOUNTER — Encounter: Payer: Self-pay | Admitting: Internal Medicine

## 2016-05-14 DIAGNOSIS — I10 Essential (primary) hypertension: Secondary | ICD-10-CM

## 2016-05-14 DIAGNOSIS — E785 Hyperlipidemia, unspecified: Secondary | ICD-10-CM

## 2016-05-14 DIAGNOSIS — I359 Nonrheumatic aortic valve disorder, unspecified: Secondary | ICD-10-CM | POA: Diagnosis not present

## 2016-05-14 DIAGNOSIS — N189 Chronic kidney disease, unspecified: Secondary | ICD-10-CM | POA: Diagnosis not present

## 2016-05-14 DIAGNOSIS — I679 Cerebrovascular disease, unspecified: Secondary | ICD-10-CM | POA: Diagnosis not present

## 2016-05-14 DIAGNOSIS — G3183 Dementia with Lewy bodies: Secondary | ICD-10-CM | POA: Diagnosis not present

## 2016-05-14 DIAGNOSIS — I4891 Unspecified atrial fibrillation: Secondary | ICD-10-CM | POA: Diagnosis not present

## 2016-05-14 DIAGNOSIS — I429 Cardiomyopathy, unspecified: Secondary | ICD-10-CM | POA: Diagnosis not present

## 2016-05-14 DIAGNOSIS — I421 Obstructive hypertrophic cardiomyopathy: Secondary | ICD-10-CM

## 2016-05-14 NOTE — Progress Notes (Signed)
Location:  [place] Room Number: 202D Place of Service:  SNF [phone])  [name], MD  Patient Care Team: Hennie Duos, MD as PCP - General (Internal Medicine)  Extended Emergency Contact Information Primary Emergency Contact: Camera,James Address: [address] [place], [place] [address] [place] Phone: [phone] Relation: Son Secondary Emergency Contact: Rudell Cobb [place] Phone: [phone] Relation: Other    Allergies: Cymbalta [duloxetine hcl]; Lipitor [atorvastatin]; Metformin and related; Simvastatin; and Verapamil  Chief Complaint  Patient presents with  . Medical Management of Chronic Issues    Routine Visit    HPI: Patient is 83 y.o. female who is being seen for routine issues of cardiomyopathy, HTN, and HLD.  Past Medical History:  Diagnosis Date  . Alzheimer disease   . Atrial fibrillation ([place])   . Chronic kidney disease   . Colon polyp   . Diabetes mellitus without complication ([place])   . Fibromyalgia   . GERD (gastroesophageal reflux disease)   . HOCM (hypertrophic obstructive cardiomyopathy) ([place])   . Hypertension   . Neuropathy [facility])     Past Surgical History:  Procedure Laterality Date  . SHOULDER SURGERY Left    rotator cuff    Allergies as of 05/14/2016      Reactions   Cymbalta [duloxetine Hcl] Other (See Comments)   Diarrhea Listed on MAR   Lipitor [atorvastatin] Other (See Comments)   myalgias Listed on MAR   Metformin And Related Other (See Comments)   Listed on MAR   Simvastatin Other (See Comments)   Listed on MAR   Verapamil    Sinus arrest      Medication List       Accurate as of 05/14/16 11:59 PM. Always use your most recent med list.          amLODipine 5 MG tablet Commonly known as:  NORVASC Take 5 mg by mouth daily.   aspirin 81 MG chewable tablet Chew 324 mg by mouth daily. 4 tablets   divalproex 125 MG  capsule Commonly known as:  DEPAKOTE SPRINKLE Take 2 capsules (250mg ) by mouth each morning and take 3 capsules (375 mg) by mouth every evening.   docusate sodium 100 MG capsule Commonly known as:  COLACE Take 1 capsule (100 mg total) by mouth daily.   escitalopram 5 MG tablet Commonly known as:  LEXAPRO Take 10 mg by mouth daily. 2 tablets   feeding supplement (ENSURE ENLIVE) Liqd Take 237 mLs by mouth 4 (four) times daily -  before meals and at bedtime.   feeding supplement (PRO-STAT SUGAR FREE 64) Liqd Take 30 mLs by mouth 2 (two) times daily.   fluticasone 50 MCG/ACT nasal spray Commonly known as:  FLONASE Place 2 sprays into both nostrils daily.   HYDROcodone-acetaminophen 5-325 MG tablet Commonly known as:  NORCO/VICODIN Take 1 tablet by mouth every 4 (four) hours as needed for moderate pain.   loratadine 10 MG tablet Commonly known as:  CLARITIN Take 10 mg by mouth daily as needed for allergies.   metoprolol tartrate 25 mg/10 mL Susp Commonly known as:  LOPRESSOR Give 15 mls (75 mg) by mouth twice daily   omeprazole 20 MG capsule Commonly known as:  PRILOSEC Take 20 mg by mouth daily.   sennosides-docusate sodium 8.6-50 MG tablet Commonly known as:  SENOKOT-S Take 1 tablet by mouth at bedtime.   THERA  VITAMIN PO Take 1 tablet by mouth daily.   UNABLE TO FIND Med Name: Med Pass initiate 4 oz  three times daily       No orders of the defined types were placed in this encounter.   Immunization History  Administered Date(s) Administered  . Influenza Inj Mdck Quad Pf 12/22/2013  . Influenza, Seasonal, Injecte, Preservative Fre 12/02/2010  . Influenza-Unspecified 12/06/2015  . PPD Test 11/07/2014, 06/08/2015, 06/25/2015  . Pneumococcal Conjugate-13 03/10/1998  . Pneumococcal Polysaccharide-23 10/23/2010  . Zoster 05/05/2007    Social History  Substance Use Topics  . Smoking status: Never Smoker  . Smokeless tobacco: Never Used  . Alcohol use No     Review of Systems  UTO 2/2 dementia    Vitals:   05/14/16 0953  BP: (!) 149/73  Pulse: 82  Resp: 18  Temp: 97 F (36.1 C)   Body mass index is 17.46 kg/m. Physical Exam  GENERAL APPEARANCE: Alert, No acute distress , can answer yes or no SKIN: No diaphoresis rash HEENT: Unremarkable RESPIRATORY: Breathing is even, unlabored. Lung sounds are clear   CARDIOVASCULAR: Heart RRR 2/6 systolic murmur, no  rubs or gallops. No peripheral edema  GASTROINTESTINAL: Abdomen is soft, non-tender, not distended w/ normal bowel sounds.  GENITOURINARY: Bladder non tender, not distended  MUSCULOSKELETAL: very thin with contractures NEUROLOGIC: Cranial nerves 2-12 grossly intact; minimal movement of all ext PSYCHIATRIC: Mood and affect with dementia, no behavioral issues, very sweet  Patient Active Problem List   Diagnosis Date Noted  . Skin ulcers of both feet ([place]) 10/23/2015  . Vitamin B12 deficiency 10/23/2015  . FTT (failure to thrive) in adult 09/01/2015  . Lewy body dementia 09/01/2015  . Corn or callus 02/06/2015  . Dizziness 02/06/2015  . Neuropathy ([place]) 02/06/2015  . Secondary parkinsonism ([place])   . Depression   . Anxiety   . Essential hypertension   . HLD (hyperlipidemia)   . Nonorganic psychosis   . Atrial fibrillation ([place]) 01/12/2015  . Sepsis ([place]) 01/12/2015  . Ischemic stroke ([place]) 01/12/2015  . Acute encephalopathy   . Paroxysmal atrial fibrillation (HCC)   . Schizophrenia ([place])   . Atrial fibrillation with RVR ([place]) 01/11/2015  . SVT (supraventricular tachycardia) ([place]) 01/11/2015  . Alzheimer disease   . Diabetes mellitus without complication ([place])   . HOCM (hypertrophic obstructive cardiomyopathy) ([place])   . HTN (hypertension) 01/07/2015  . Edema 01/06/2015  . Blister of skin without infection 01/05/2015  . Parkinsonian features 12/25/2014  . Weakness 12/18/2014  . Altered mental status 12/11/2014  . UTI (urinary tract infection) 12/02/2014  . GERD  (gastroesophageal reflux disease) 12/02/2014  . Psychosis 12/02/2014  . Renal failure (ARF), acute on chronic (HCC) 11/24/2014  . MDD (major depressive disorder) 11/24/2014  . Pressure ulcer 11/24/2014  . Elevated troponin 11/23/2014  . Difficulty hearing 11/07/2014  . Alzheimer's dementia with behavioral disturbance 11/03/2014  . Severe episode of recurrent major depressive disorder, without psychotic features ([place]) 11/02/2014  . Chronic kidney disease (CKD), stage III (moderate) 09/17/2014  . Rotator cuff arthropathy 07/21/2014  . Diabetic polyneuropathy ([place]) 05/21/2014  . Acid reflux 05/21/2014  . Aortic heart valve narrowing 02/24/2014  . Acquired deformities of toe 07/12/2013  . Hypertrophic obstructive cardiomyopathy ([place]) 07/04/2013  . Left ventricular outflow obstruction 05/31/2012  . Left ventricular hypertrophy 05/07/2011  . Sick sinus syndrome ([place]) 05/07/2011  . Cardiac conduction disorder 05/01/2011  . Diabetes mellitus ([place]) 05/01/2011  . Degeneration of intervertebral disc of lumbosacral region  01/27/2011  . Chronic kidney disease 12/16/2010  . Absence of bladder continence 12/16/2010  . Diverticulitis 06/03/2010  . Fibromyalgia 06/03/2010  . Arthritis, degenerative 06/03/2010  . Adenomatous colon polyp 08/03/2006    CMP     Component Value Date/Time   NA 137 04/11/2016   K 4.5 04/11/2016   CL 113 (H) 01/15/2015 0518   CO2 18 (L) 01/15/2015 0518   GLUCOSE 139 (H) 01/15/2015 0518   BUN 14 04/11/2016   CREATININE 0.5 04/11/2016   CREATININE 0.69 01/15/2015 0518   CALCIUM 8.9 01/15/2015 0518   PROT 4.9 (L) 01/14/2015 0154   ALBUMIN 2.0 (L) 01/14/2015 0154   AST 25 01/14/2015 0154   ALT 31 01/14/2015 0154   ALKPHOS 93 01/14/2015 0154   BILITOT 1.1 01/14/2015 0154   GFRNONAA >60 01/15/2015 0518   GFRAA >60 01/15/2015 0518    Recent Labs  04/11/16  NA 137  K 4.5  BUN 14  CREATININE 0.5   No results for input(s): AST, ALT, ALKPHOS, BILITOT, PROT,  ALBUMIN in the last 8760 hours. No results for input(s): WBC, NEUTROABS, HGB, HCT, MCV, PLT in the last 8760 hours. No results for input(s): CHOL, LDLCALC, TRIG in the last 8760 hours.  Invalid input(s): HCL No results found for: [facility] Lab Results  Component Value Date   TSH 3.421 01/12/2015   Lab Results  Component Value Date   HGBA1C 6.1 (H) 01/12/2015   Lab Results  Component Value Date   CHOL 217 (H) 01/12/2015   HDL 20 (L) 01/12/2015   LDLCALC 156 (H) 01/12/2015   TRIG 207 (H) 01/12/2015   CHOLHDL 10.9 01/12/2015    Significant Diagnostic Results in last 30 days:  No results found.  Assessment and Plan  Hypertrophic obstructive cardiomyopathy (HCC) No exacerbations or problems; plan to cont metoprolol 75 mg BID  HTN (hypertension) Controlled for age;plan to cont metoprolol 75 mg BID and norvasc 5 mg daily  HLD (hyperlipidemia) No longer on lipitor;pt is Hospice, not doing blood draws     Webb Silversmith D. Sheppard Coil, MD

## 2016-05-16 DIAGNOSIS — I359 Nonrheumatic aortic valve disorder, unspecified: Secondary | ICD-10-CM | POA: Diagnosis not present

## 2016-05-16 DIAGNOSIS — N189 Chronic kidney disease, unspecified: Secondary | ICD-10-CM | POA: Diagnosis not present

## 2016-05-16 DIAGNOSIS — G3183 Dementia with Lewy bodies: Secondary | ICD-10-CM | POA: Diagnosis not present

## 2016-05-16 DIAGNOSIS — I4891 Unspecified atrial fibrillation: Secondary | ICD-10-CM | POA: Diagnosis not present

## 2016-05-16 DIAGNOSIS — I679 Cerebrovascular disease, unspecified: Secondary | ICD-10-CM | POA: Diagnosis not present

## 2016-05-16 DIAGNOSIS — I429 Cardiomyopathy, unspecified: Secondary | ICD-10-CM | POA: Diagnosis not present

## 2016-05-19 DIAGNOSIS — I429 Cardiomyopathy, unspecified: Secondary | ICD-10-CM | POA: Diagnosis not present

## 2016-05-19 DIAGNOSIS — I679 Cerebrovascular disease, unspecified: Secondary | ICD-10-CM | POA: Diagnosis not present

## 2016-05-19 DIAGNOSIS — I359 Nonrheumatic aortic valve disorder, unspecified: Secondary | ICD-10-CM | POA: Diagnosis not present

## 2016-05-19 DIAGNOSIS — N189 Chronic kidney disease, unspecified: Secondary | ICD-10-CM | POA: Diagnosis not present

## 2016-05-19 DIAGNOSIS — G3183 Dementia with Lewy bodies: Secondary | ICD-10-CM | POA: Diagnosis not present

## 2016-05-19 DIAGNOSIS — I4891 Unspecified atrial fibrillation: Secondary | ICD-10-CM | POA: Diagnosis not present

## 2016-05-21 ENCOUNTER — Encounter: Payer: Self-pay | Admitting: Internal Medicine

## 2016-05-21 NOTE — Progress Notes (Signed)
Location:  [place] Room Number: 202D Place of Service:  SNF [phone])  [name], MD  Patient Care Team: Hennie Duos, MD as PCP - General (Internal Medicine)  Extended Emergency Contact Information Primary Emergency Contact: Rix,James Address: [address] [place], [place] [address] [place] Phone: [phone] Relation: Son Secondary Emergency Contact: Rudell Cobb [place] of [place] Phone: [phone] Relation: Other    Allergies: Cymbalta [duloxetine hcl]; Lipitor [atorvastatin]; Metformin and related; Simvastatin; and Verapamil  Chief Complaint  Patient presents with  . Acute Visit    HPI: Patient is 83 y.o. female who is being seen acutely because an outbreak of Influenza A per CDC guidelines was recognized on 04/09/2016. Pt has no c/o flu like symptoms;therefore pt will need to be prophylaxed with Tamiflu for a minimum of 14 days per CDC protocol.  Past Medical History:  Diagnosis Date  . Alzheimer disease   . Atrial fibrillation ([place])   . Chronic kidney disease   . Colon polyp   . Diabetes mellitus without complication ([place])   . Fibromyalgia   . GERD (gastroesophageal reflux disease)   . HOCM (hypertrophic obstructive cardiomyopathy) ([place])   . Hypertension   . Neuropathy [facility])     Past Surgical History:  Procedure Laterality Date  . SHOULDER SURGERY Left    rotator cuff    Allergies as of 04/10/2016      Reactions   Cymbalta [duloxetine Hcl] Other (See Comments)   Diarrhea Listed on MAR   Lipitor [atorvastatin] Other (See Comments)   myalgias Listed on MAR   Metformin And Related Other (See Comments)   Listed on MAR   Simvastatin Other (See Comments)   Listed on MAR   Verapamil    Sinus arrest      Medication List       Accurate as of 04/10/16 11:59 PM. Always use your most recent med list.          amLODipine 5 MG tablet Commonly known as:  NORVASC Take  5 mg by mouth daily.   aspirin 81 MG chewable tablet Chew 324 mg by mouth daily. 4 tablets   divalproex 125 MG capsule Commonly known as:  DEPAKOTE SPRINKLE Take 2 capsules (250mg ) by mouth each morning and take 3 capsules (375 mg) by mouth every evening.   docusate sodium 100 MG capsule Commonly known as:  COLACE Take 1 capsule (100 mg total) by mouth daily.   escitalopram 5 MG tablet Commonly known as:  LEXAPRO Take 10 mg by mouth daily. 2 tablets   feeding supplement (ENSURE ENLIVE) Liqd Take 237 mLs by mouth 4 (four) times daily -  before meals and at bedtime.   feeding supplement (PRO-STAT SUGAR FREE 64) Liqd Take 30 mLs by mouth 2 (two) times daily.   fluticasone 50 MCG/ACT nasal spray Commonly known as:  FLONASE Place 2 sprays into both nostrils daily.   HYDROcodone-acetaminophen 5-325 MG tablet Commonly known as:  NORCO/VICODIN Take 1 tablet by mouth every 4 (four) hours as needed for moderate pain.   loratadine 10 MG tablet Commonly known as:  CLARITIN Take 10 mg by mouth daily as needed for allergies.   metoprolol tartrate 25 mg/10 mL Susp Commonly known as:  LOPRESSOR Give 15 mls (75 mg) by mouth twice daily   omeprazole 20 MG capsule Commonly known as:  PRILOSEC Take 20 mg by mouth daily.  sennosides-docusate sodium 8.6-50 MG tablet Commonly known as:  SENOKOT-S Take 1 tablet by mouth at bedtime.   THERA VITAMIN PO Take 1 tablet by mouth daily.   UNABLE TO FIND Med Name: Med Pass initiate 4 oz  three times daily       No orders of the defined types were placed in this encounter.   Immunization History  Administered Date(s) Administered  . Influenza Inj Mdck Quad Pf 12/22/2013  . Influenza, Seasonal, Injecte, Preservative Fre 12/02/2010  . Influenza-Unspecified 12/06/2015  . PPD Test 11/07/2014, 06/08/2015, 06/25/2015  . Pneumococcal Conjugate-13 03/10/1998  . Pneumococcal Polysaccharide-23 10/23/2010  . Zoster 05/05/2007    Social  History  Substance Use Topics  . Smoking status: Never Smoker  . Smokeless tobacco: Never Used  . Alcohol use No    Review of Systems  DATA OBTAINED: from nurse GENERAL:  no fevers SKIN: No itching, rash HEENT: no rhinorrhea, congestion, ST or ear pain RESPIRATORY: No cough, wheezing, SOB CARDIAC: No chest pain, palpitations, lower extremity edema  GI: No abdominal pain, No N/V/D or constipation, No heartburn or reflux  MUSCULOSKELETAL: No muscle aches NEUROLOGIC: No headache, dizziness   Vitals:   04/10/16 1247  BP: (!) 149/72  Pulse: 82  Resp: 18  Temp: 97 F (36.1 C)   Body mass index is 17.46 kg/m. Physical Exam  GENERAL APPEARANCE: Alert, No acute distress  SKIN: No diaphoresis rash HEENT: Unremarkable RESPIRATORY: Breathing is even, unlabored. Lung sounds are clear   CARDIOVASCULAR: Heart RRR 2/6  Murmur no  rubs or gallops. No peripheral edema  GASTROINTESTINAL: Abdomen is soft, non-tender, not distended w/ normal bowel sounds.   NEUROLOGIC: Cranial nerves 2-12 grossly intact PSYCHIATRIC: baseline, no mental status changes  Patient Active Problem List   Diagnosis Date Noted  . Skin ulcers of both feet ([place]) 10/23/2015  . Vitamin B12 deficiency 10/23/2015  . FTT (failure to thrive) in adult 09/01/2015  . Lewy body dementia 09/01/2015  . Corn or callus 02/06/2015  . Dizziness 02/06/2015  . Neuropathy ([place]) 02/06/2015  . Secondary parkinsonism ([place])   . Depression   . Anxiety   . Essential hypertension   . HLD (hyperlipidemia)   . Nonorganic psychosis   . Atrial fibrillation ([place]) 01/12/2015  . Sepsis ([place]) 01/12/2015  . Ischemic stroke ([place]) 01/12/2015  . Acute encephalopathy   . Paroxysmal atrial fibrillation (H[facility])   . Schizophrenia ([place])   . Atrial fibrillation with RVR ([place]) 01/11/2015  . SVT (supraventricular tachycardia) ([place]) 01/11/2015  . Alzheimer disease   . Diabetes mellitus without complication ([place])   . HOCM (hypertrophic obstructive  cardiomyopathy) ([place])   . HTN (hypertension) 01/07/2015  . Edema 01/06/2015  . Blister of skin without infection 01/05/2015  . Parkinsonian features 12/25/2014  . Weakness 12/18/2014  . Altered mental status 12/11/2014  . UTI (urinary tract infection) 12/02/2014  . GERD (gastroesophageal reflux disease) 12/02/2014  . Psychosis 12/02/2014  . Renal failure (ARF), acute on chronic (HCC) 11/24/2014  . MDD (major depressive disorder) 11/24/2014  . Pressure ulcer 11/24/2014  . Elevated troponin 11/23/2014  . Difficulty hearing 11/07/2014  . Alzheimer's dementia with behavioral disturbance 11/03/2014  . Severe episode of recurrent major depressive disorder, without psychotic features ([place]) 11/02/2014  . Chronic kidney disease (CKD), stage III (moderate) 09/17/2014  . Rotator cuff arthropathy 07/21/2014  . Diabetic polyneuropathy ([place]) 05/21/2014  . Acid reflux 05/21/2014  . Aortic heart valve narrowing 02/24/2014  . Acquired deformities of toe 07/12/2013  . Hypertrophic  obstructive cardiomyopathy ([place]) 07/04/2013  . Left ventricular outflow obstruction 05/31/2012  . Left ventricular hypertrophy 05/07/2011  . Sick sinus syndrome ([place]) 05/07/2011  . Cardiac conduction disorder 05/01/2011  . Diabetes mellitus ([place]) 05/01/2011  . Degeneration of intervertebral disc of lumbosacral region 01/27/2011  . Chronic kidney disease 12/16/2010  . Absence of bladder continence 12/16/2010  . Diverticulitis 06/03/2010  . Fibromyalgia 06/03/2010  . Arthritis, degenerative 06/03/2010  . Adenomatous colon polyp 08/03/2006    CMP     Component Value Date/Time   NA 137 04/11/2016   K 4.5 04/11/2016   CL 113 (H) 01/15/2015 0518   CO2 18 (L) 01/15/2015 0518   GLUCOSE 139 (H) 01/15/2015 0518   BUN 14 04/11/2016   CREATININE 0.5 04/11/2016   CREATININE 0.69 01/15/2015 0518   CALCIUM 8.9 01/15/2015 0518   PROT 4.9 (L) 01/14/2015 0154   ALBUMIN 2.0 (L) 01/14/2015 0154   AST 25 01/14/2015 0154   ALT  31 01/14/2015 0154   ALKPHOS 93 01/14/2015 0154   BILITOT 1.1 01/14/2015 0154   GFRNONAA >60 01/15/2015 0518   GFRAA >60 01/15/2015 0518    Recent Labs  04/11/16  NA 137  K 4.5  BUN 14  CREATININE 0.5   No results for input(s): AST, ALT, ALKPHOS, BILITOT, PROT, ALBUMIN in the last 8760 hours. No results for input(s): WBC, NEUTROABS, HGB, HCT, MCV, PLT in the last 8760 hours. No results for input(s): CHOL, LDLCALC, TRIG in the last 8760 hours.  Invalid input(s): HCL No results found for: [facility] Lab Results  Component Value Date   TSH 3.421 01/12/2015   Lab Results  Component Value Date   HGBA1C 6.1 (H) 01/12/2015   Lab Results  Component Value Date   CHOL 217 (H) 01/12/2015   HDL 20 (L) 01/12/2015   LDLCALC 156 (H) 01/12/2015   TRIG 207 (H) 01/12/2015   CHOLHDL 10.9 01/12/2015    Significant Diagnostic Results in last 30 days:  No results found.  Assessment and Plan  EXPOSURE TO FLU/ INFLUENZA OUTBREAK AT SNF-   CrCl calculated by me-   55.4     Dose for 14 days-  30 mg daily Pt will be monitored daily for flu like symptoms                                                                                 Webb Silversmith D. Sheppard Coil, MD

## 2016-05-22 DIAGNOSIS — N189 Chronic kidney disease, unspecified: Secondary | ICD-10-CM | POA: Diagnosis not present

## 2016-05-22 DIAGNOSIS — G3183 Dementia with Lewy bodies: Secondary | ICD-10-CM | POA: Diagnosis not present

## 2016-05-22 DIAGNOSIS — I429 Cardiomyopathy, unspecified: Secondary | ICD-10-CM | POA: Diagnosis not present

## 2016-05-22 DIAGNOSIS — I679 Cerebrovascular disease, unspecified: Secondary | ICD-10-CM | POA: Diagnosis not present

## 2016-05-22 DIAGNOSIS — I359 Nonrheumatic aortic valve disorder, unspecified: Secondary | ICD-10-CM | POA: Diagnosis not present

## 2016-05-22 DIAGNOSIS — I4891 Unspecified atrial fibrillation: Secondary | ICD-10-CM | POA: Diagnosis not present

## 2016-05-24 ENCOUNTER — Encounter: Payer: Self-pay | Admitting: Internal Medicine

## 2016-05-24 DIAGNOSIS — I679 Cerebrovascular disease, unspecified: Secondary | ICD-10-CM | POA: Diagnosis not present

## 2016-05-24 DIAGNOSIS — I4891 Unspecified atrial fibrillation: Secondary | ICD-10-CM | POA: Diagnosis not present

## 2016-05-24 DIAGNOSIS — N189 Chronic kidney disease, unspecified: Secondary | ICD-10-CM | POA: Diagnosis not present

## 2016-05-24 DIAGNOSIS — I429 Cardiomyopathy, unspecified: Secondary | ICD-10-CM | POA: Diagnosis not present

## 2016-05-24 DIAGNOSIS — I359 Nonrheumatic aortic valve disorder, unspecified: Secondary | ICD-10-CM | POA: Diagnosis not present

## 2016-05-24 DIAGNOSIS — G3183 Dementia with Lewy bodies: Secondary | ICD-10-CM | POA: Diagnosis not present

## 2016-05-24 NOTE — Assessment & Plan Note (Signed)
No exacerbations or problems; plan to cont metoprolol 75 mg BID

## 2016-05-24 NOTE — Assessment & Plan Note (Signed)
No longer on lipitor;pt is Hospice, not doing blood draws

## 2016-05-24 NOTE — Assessment & Plan Note (Signed)
Controlled for age;plan to cont metoprolol 75 mg BID and norvasc 5 mg daily

## 2016-05-27 DIAGNOSIS — I679 Cerebrovascular disease, unspecified: Secondary | ICD-10-CM | POA: Diagnosis not present

## 2016-05-27 DIAGNOSIS — I359 Nonrheumatic aortic valve disorder, unspecified: Secondary | ICD-10-CM | POA: Diagnosis not present

## 2016-05-27 DIAGNOSIS — N189 Chronic kidney disease, unspecified: Secondary | ICD-10-CM | POA: Diagnosis not present

## 2016-05-27 DIAGNOSIS — G3183 Dementia with Lewy bodies: Secondary | ICD-10-CM | POA: Diagnosis not present

## 2016-05-27 DIAGNOSIS — I4891 Unspecified atrial fibrillation: Secondary | ICD-10-CM | POA: Diagnosis not present

## 2016-05-27 DIAGNOSIS — I429 Cardiomyopathy, unspecified: Secondary | ICD-10-CM | POA: Diagnosis not present

## 2016-05-28 DIAGNOSIS — I4891 Unspecified atrial fibrillation: Secondary | ICD-10-CM | POA: Diagnosis not present

## 2016-05-28 DIAGNOSIS — M21619 Bunion of unspecified foot: Secondary | ICD-10-CM | POA: Insufficient documentation

## 2016-05-28 DIAGNOSIS — G3183 Dementia with Lewy bodies: Secondary | ICD-10-CM | POA: Diagnosis not present

## 2016-05-28 DIAGNOSIS — I359 Nonrheumatic aortic valve disorder, unspecified: Secondary | ICD-10-CM | POA: Diagnosis not present

## 2016-05-28 DIAGNOSIS — N189 Chronic kidney disease, unspecified: Secondary | ICD-10-CM | POA: Diagnosis not present

## 2016-05-28 DIAGNOSIS — I429 Cardiomyopathy, unspecified: Secondary | ICD-10-CM | POA: Diagnosis not present

## 2016-05-28 DIAGNOSIS — I679 Cerebrovascular disease, unspecified: Secondary | ICD-10-CM | POA: Diagnosis not present

## 2016-05-28 DIAGNOSIS — L84 Corns and callosities: Secondary | ICD-10-CM | POA: Insufficient documentation

## 2016-05-29 DIAGNOSIS — I429 Cardiomyopathy, unspecified: Secondary | ICD-10-CM | POA: Diagnosis not present

## 2016-05-29 DIAGNOSIS — I4891 Unspecified atrial fibrillation: Secondary | ICD-10-CM | POA: Diagnosis not present

## 2016-05-29 DIAGNOSIS — I679 Cerebrovascular disease, unspecified: Secondary | ICD-10-CM | POA: Diagnosis not present

## 2016-05-29 DIAGNOSIS — G3183 Dementia with Lewy bodies: Secondary | ICD-10-CM | POA: Diagnosis not present

## 2016-05-29 DIAGNOSIS — N189 Chronic kidney disease, unspecified: Secondary | ICD-10-CM | POA: Diagnosis not present

## 2016-05-29 DIAGNOSIS — I359 Nonrheumatic aortic valve disorder, unspecified: Secondary | ICD-10-CM | POA: Diagnosis not present

## 2016-05-30 DIAGNOSIS — N189 Chronic kidney disease, unspecified: Secondary | ICD-10-CM | POA: Diagnosis not present

## 2016-05-30 DIAGNOSIS — I679 Cerebrovascular disease, unspecified: Secondary | ICD-10-CM | POA: Diagnosis not present

## 2016-05-30 DIAGNOSIS — I429 Cardiomyopathy, unspecified: Secondary | ICD-10-CM | POA: Diagnosis not present

## 2016-05-30 DIAGNOSIS — G3183 Dementia with Lewy bodies: Secondary | ICD-10-CM | POA: Diagnosis not present

## 2016-05-30 DIAGNOSIS — I4891 Unspecified atrial fibrillation: Secondary | ICD-10-CM | POA: Diagnosis not present

## 2016-05-30 DIAGNOSIS — I359 Nonrheumatic aortic valve disorder, unspecified: Secondary | ICD-10-CM | POA: Diagnosis not present

## 2016-06-02 DIAGNOSIS — I359 Nonrheumatic aortic valve disorder, unspecified: Secondary | ICD-10-CM | POA: Diagnosis not present

## 2016-06-02 DIAGNOSIS — I679 Cerebrovascular disease, unspecified: Secondary | ICD-10-CM | POA: Diagnosis not present

## 2016-06-02 DIAGNOSIS — I429 Cardiomyopathy, unspecified: Secondary | ICD-10-CM | POA: Diagnosis not present

## 2016-06-02 DIAGNOSIS — G3183 Dementia with Lewy bodies: Secondary | ICD-10-CM | POA: Diagnosis not present

## 2016-06-02 DIAGNOSIS — N189 Chronic kidney disease, unspecified: Secondary | ICD-10-CM | POA: Diagnosis not present

## 2016-06-02 DIAGNOSIS — I4891 Unspecified atrial fibrillation: Secondary | ICD-10-CM | POA: Diagnosis not present

## 2016-06-04 DIAGNOSIS — G3183 Dementia with Lewy bodies: Secondary | ICD-10-CM | POA: Diagnosis not present

## 2016-06-04 DIAGNOSIS — N189 Chronic kidney disease, unspecified: Secondary | ICD-10-CM | POA: Diagnosis not present

## 2016-06-04 DIAGNOSIS — I429 Cardiomyopathy, unspecified: Secondary | ICD-10-CM | POA: Diagnosis not present

## 2016-06-04 DIAGNOSIS — I359 Nonrheumatic aortic valve disorder, unspecified: Secondary | ICD-10-CM | POA: Diagnosis not present

## 2016-06-04 DIAGNOSIS — I4891 Unspecified atrial fibrillation: Secondary | ICD-10-CM | POA: Diagnosis not present

## 2016-06-04 DIAGNOSIS — I679 Cerebrovascular disease, unspecified: Secondary | ICD-10-CM | POA: Diagnosis not present

## 2016-06-05 DIAGNOSIS — G3183 Dementia with Lewy bodies: Secondary | ICD-10-CM | POA: Diagnosis not present

## 2016-06-05 DIAGNOSIS — I359 Nonrheumatic aortic valve disorder, unspecified: Secondary | ICD-10-CM | POA: Diagnosis not present

## 2016-06-05 DIAGNOSIS — I4891 Unspecified atrial fibrillation: Secondary | ICD-10-CM | POA: Diagnosis not present

## 2016-06-05 DIAGNOSIS — I679 Cerebrovascular disease, unspecified: Secondary | ICD-10-CM | POA: Diagnosis not present

## 2016-06-05 DIAGNOSIS — N189 Chronic kidney disease, unspecified: Secondary | ICD-10-CM | POA: Diagnosis not present

## 2016-06-05 DIAGNOSIS — I429 Cardiomyopathy, unspecified: Secondary | ICD-10-CM | POA: Diagnosis not present

## 2016-06-08 DIAGNOSIS — G9009 Other idiopathic peripheral autonomic neuropathy: Secondary | ICD-10-CM | POA: Diagnosis not present

## 2016-06-08 DIAGNOSIS — I10 Essential (primary) hypertension: Secondary | ICD-10-CM | POA: Diagnosis not present

## 2016-06-08 DIAGNOSIS — I359 Nonrheumatic aortic valve disorder, unspecified: Secondary | ICD-10-CM | POA: Diagnosis not present

## 2016-06-08 DIAGNOSIS — K219 Gastro-esophageal reflux disease without esophagitis: Secondary | ICD-10-CM | POA: Diagnosis not present

## 2016-06-08 DIAGNOSIS — N189 Chronic kidney disease, unspecified: Secondary | ICD-10-CM | POA: Diagnosis not present

## 2016-06-08 DIAGNOSIS — G3183 Dementia with Lewy bodies: Secondary | ICD-10-CM | POA: Diagnosis not present

## 2016-06-08 DIAGNOSIS — I429 Cardiomyopathy, unspecified: Secondary | ICD-10-CM | POA: Diagnosis not present

## 2016-06-08 DIAGNOSIS — R1310 Dysphagia, unspecified: Secondary | ICD-10-CM | POA: Diagnosis not present

## 2016-06-08 DIAGNOSIS — I4891 Unspecified atrial fibrillation: Secondary | ICD-10-CM | POA: Diagnosis not present

## 2016-06-08 DIAGNOSIS — R630 Anorexia: Secondary | ICD-10-CM | POA: Diagnosis not present

## 2016-06-08 DIAGNOSIS — L8990 Pressure ulcer of unspecified site, unspecified stage: Secondary | ICD-10-CM | POA: Diagnosis not present

## 2016-06-08 DIAGNOSIS — J309 Allergic rhinitis, unspecified: Secondary | ICD-10-CM | POA: Diagnosis not present

## 2016-06-08 DIAGNOSIS — R634 Abnormal weight loss: Secondary | ICD-10-CM | POA: Diagnosis not present

## 2016-06-08 DIAGNOSIS — E1159 Type 2 diabetes mellitus with other circulatory complications: Secondary | ICD-10-CM | POA: Diagnosis not present

## 2016-06-08 DIAGNOSIS — E785 Hyperlipidemia, unspecified: Secondary | ICD-10-CM | POA: Diagnosis not present

## 2016-06-08 DIAGNOSIS — M2450 Contracture, unspecified joint: Secondary | ICD-10-CM | POA: Diagnosis not present

## 2016-06-08 DIAGNOSIS — F319 Bipolar disorder, unspecified: Secondary | ICD-10-CM | POA: Diagnosis not present

## 2016-06-08 DIAGNOSIS — I679 Cerebrovascular disease, unspecified: Secondary | ICD-10-CM | POA: Diagnosis not present

## 2016-06-09 DIAGNOSIS — I4891 Unspecified atrial fibrillation: Secondary | ICD-10-CM | POA: Diagnosis not present

## 2016-06-09 DIAGNOSIS — N189 Chronic kidney disease, unspecified: Secondary | ICD-10-CM | POA: Diagnosis not present

## 2016-06-09 DIAGNOSIS — I359 Nonrheumatic aortic valve disorder, unspecified: Secondary | ICD-10-CM | POA: Diagnosis not present

## 2016-06-09 DIAGNOSIS — G3183 Dementia with Lewy bodies: Secondary | ICD-10-CM | POA: Diagnosis not present

## 2016-06-09 DIAGNOSIS — I679 Cerebrovascular disease, unspecified: Secondary | ICD-10-CM | POA: Diagnosis not present

## 2016-06-09 DIAGNOSIS — I429 Cardiomyopathy, unspecified: Secondary | ICD-10-CM | POA: Diagnosis not present

## 2016-06-11 ENCOUNTER — Encounter: Payer: Self-pay | Admitting: Internal Medicine

## 2016-06-11 DIAGNOSIS — G3183 Dementia with Lewy bodies: Secondary | ICD-10-CM | POA: Diagnosis not present

## 2016-06-11 DIAGNOSIS — N189 Chronic kidney disease, unspecified: Secondary | ICD-10-CM | POA: Diagnosis not present

## 2016-06-11 DIAGNOSIS — I429 Cardiomyopathy, unspecified: Secondary | ICD-10-CM | POA: Diagnosis not present

## 2016-06-11 DIAGNOSIS — I359 Nonrheumatic aortic valve disorder, unspecified: Secondary | ICD-10-CM | POA: Diagnosis not present

## 2016-06-11 DIAGNOSIS — I679 Cerebrovascular disease, unspecified: Secondary | ICD-10-CM | POA: Diagnosis not present

## 2016-06-11 DIAGNOSIS — I4891 Unspecified atrial fibrillation: Secondary | ICD-10-CM | POA: Diagnosis not present

## 2016-06-11 NOTE — Progress Notes (Signed)
Opened in error; Disregard.

## 2016-06-13 DIAGNOSIS — I429 Cardiomyopathy, unspecified: Secondary | ICD-10-CM | POA: Diagnosis not present

## 2016-06-13 DIAGNOSIS — N189 Chronic kidney disease, unspecified: Secondary | ICD-10-CM | POA: Diagnosis not present

## 2016-06-13 DIAGNOSIS — G3183 Dementia with Lewy bodies: Secondary | ICD-10-CM | POA: Diagnosis not present

## 2016-06-13 DIAGNOSIS — I359 Nonrheumatic aortic valve disorder, unspecified: Secondary | ICD-10-CM | POA: Diagnosis not present

## 2016-06-13 DIAGNOSIS — I4891 Unspecified atrial fibrillation: Secondary | ICD-10-CM | POA: Diagnosis not present

## 2016-06-13 DIAGNOSIS — I679 Cerebrovascular disease, unspecified: Secondary | ICD-10-CM | POA: Diagnosis not present

## 2016-06-16 DIAGNOSIS — N189 Chronic kidney disease, unspecified: Secondary | ICD-10-CM | POA: Diagnosis not present

## 2016-06-16 DIAGNOSIS — I679 Cerebrovascular disease, unspecified: Secondary | ICD-10-CM | POA: Diagnosis not present

## 2016-06-16 DIAGNOSIS — I359 Nonrheumatic aortic valve disorder, unspecified: Secondary | ICD-10-CM | POA: Diagnosis not present

## 2016-06-16 DIAGNOSIS — I4891 Unspecified atrial fibrillation: Secondary | ICD-10-CM | POA: Diagnosis not present

## 2016-06-16 DIAGNOSIS — I429 Cardiomyopathy, unspecified: Secondary | ICD-10-CM | POA: Diagnosis not present

## 2016-06-16 DIAGNOSIS — G3183 Dementia with Lewy bodies: Secondary | ICD-10-CM | POA: Diagnosis not present

## 2016-06-17 DIAGNOSIS — G3183 Dementia with Lewy bodies: Secondary | ICD-10-CM | POA: Diagnosis not present

## 2016-06-17 DIAGNOSIS — I359 Nonrheumatic aortic valve disorder, unspecified: Secondary | ICD-10-CM | POA: Diagnosis not present

## 2016-06-17 DIAGNOSIS — N189 Chronic kidney disease, unspecified: Secondary | ICD-10-CM | POA: Diagnosis not present

## 2016-06-17 DIAGNOSIS — I429 Cardiomyopathy, unspecified: Secondary | ICD-10-CM | POA: Diagnosis not present

## 2016-06-17 DIAGNOSIS — I679 Cerebrovascular disease, unspecified: Secondary | ICD-10-CM | POA: Diagnosis not present

## 2016-06-17 DIAGNOSIS — I4891 Unspecified atrial fibrillation: Secondary | ICD-10-CM | POA: Diagnosis not present

## 2016-06-18 DIAGNOSIS — I4891 Unspecified atrial fibrillation: Secondary | ICD-10-CM | POA: Diagnosis not present

## 2016-06-18 DIAGNOSIS — I679 Cerebrovascular disease, unspecified: Secondary | ICD-10-CM | POA: Diagnosis not present

## 2016-06-18 DIAGNOSIS — I359 Nonrheumatic aortic valve disorder, unspecified: Secondary | ICD-10-CM | POA: Diagnosis not present

## 2016-06-18 DIAGNOSIS — N189 Chronic kidney disease, unspecified: Secondary | ICD-10-CM | POA: Diagnosis not present

## 2016-06-18 DIAGNOSIS — I429 Cardiomyopathy, unspecified: Secondary | ICD-10-CM | POA: Diagnosis not present

## 2016-06-18 DIAGNOSIS — G3183 Dementia with Lewy bodies: Secondary | ICD-10-CM | POA: Diagnosis not present

## 2016-06-20 DIAGNOSIS — I679 Cerebrovascular disease, unspecified: Secondary | ICD-10-CM | POA: Diagnosis not present

## 2016-06-20 DIAGNOSIS — G3183 Dementia with Lewy bodies: Secondary | ICD-10-CM | POA: Diagnosis not present

## 2016-06-20 DIAGNOSIS — I4891 Unspecified atrial fibrillation: Secondary | ICD-10-CM | POA: Diagnosis not present

## 2016-06-20 DIAGNOSIS — N189 Chronic kidney disease, unspecified: Secondary | ICD-10-CM | POA: Diagnosis not present

## 2016-06-20 DIAGNOSIS — I429 Cardiomyopathy, unspecified: Secondary | ICD-10-CM | POA: Diagnosis not present

## 2016-06-20 DIAGNOSIS — I359 Nonrheumatic aortic valve disorder, unspecified: Secondary | ICD-10-CM | POA: Diagnosis not present

## 2016-06-23 ENCOUNTER — Encounter: Payer: Self-pay | Admitting: Internal Medicine

## 2016-06-23 ENCOUNTER — Non-Acute Institutional Stay (SKILLED_NURSING_FACILITY): Payer: Medicare Other | Admitting: Internal Medicine

## 2016-06-23 DIAGNOSIS — E1122 Type 2 diabetes mellitus with diabetic chronic kidney disease: Secondary | ICD-10-CM

## 2016-06-23 DIAGNOSIS — N183 Chronic kidney disease, stage 3 unspecified: Secondary | ICD-10-CM

## 2016-06-23 DIAGNOSIS — N189 Chronic kidney disease, unspecified: Secondary | ICD-10-CM | POA: Diagnosis not present

## 2016-06-23 DIAGNOSIS — K219 Gastro-esophageal reflux disease without esophagitis: Secondary | ICD-10-CM

## 2016-06-23 DIAGNOSIS — I679 Cerebrovascular disease, unspecified: Secondary | ICD-10-CM | POA: Diagnosis not present

## 2016-06-23 DIAGNOSIS — G3183 Dementia with Lewy bodies: Secondary | ICD-10-CM | POA: Diagnosis not present

## 2016-06-23 DIAGNOSIS — I4891 Unspecified atrial fibrillation: Secondary | ICD-10-CM | POA: Diagnosis not present

## 2016-06-23 DIAGNOSIS — I480 Paroxysmal atrial fibrillation: Secondary | ICD-10-CM | POA: Diagnosis not present

## 2016-06-23 DIAGNOSIS — I429 Cardiomyopathy, unspecified: Secondary | ICD-10-CM | POA: Diagnosis not present

## 2016-06-23 DIAGNOSIS — I359 Nonrheumatic aortic valve disorder, unspecified: Secondary | ICD-10-CM | POA: Diagnosis not present

## 2016-06-23 NOTE — Progress Notes (Signed)
Location:  [place] Room Number: 202D Place of Service:  SNF (31)  Hennie Duos, MD  Patient Care Team: Hennie Duos, MD as PCP - General (Internal Medicine)  Extended Emergency Contact Information Primary Emergency Contact: Parmer,James Address: [address] [place], [place] [address] [place] Phone: [phone] Relation: Son Secondary Emergency Contact: Rudell Cobb States of [place] Phone: [phone] Relation: Other    Allergies: Cymbalta [duloxetine hcl]; Lipitor [atorvastatin]; Metformin and related; Simvastatin; and Verapamil  Chief Complaint  Patient presents with  . Medical Management of Chronic Issues    Routine Visit    HPI: Patient is 83 y.o. female who is a Hospice pt being seen for routine issues of PAF, GERD and DM2.  Past Medical History:  Diagnosis Date  . Alzheimer disease   . Atrial fibrillation ([place])   . Bunion   . Chronic kidney disease   . Colon polyp   . Corns and callosities   . CRI (chronic renal insufficiency) 12/16/2010   Nephrology, Laurance Flatten, MD  . Diabetes mellitus without complication ([place]) 93/23/5573  . Dizzy   . Dynamic left ventricular outflow obstruction 05/31/2012   Resting gradient 52 mm hg  . Fibromyalgia   . GERD (gastroesophageal reflux disease)   . HOCM (hypertrophic obstructive cardiomyopathy) ([place])   . Hypertension   . Neuropathy   . Sick sinus syndrome ([place]) 05/07/2011    Past Surgical History:  Procedure Laterality Date  . COLONOSCOPY    . EYE SURGERY     cataract  . HEMORRHOID SURGERY    . SHOULDER SURGERY Left    rotator cuff  . UPPER GASTROINTESTINAL ENDOSCOPY      Allergies as of 06/23/2016      Reactions   Cymbalta [duloxetine Hcl] Other (See Comments)   Diarrhea Listed on MAR   Lipitor [atorvastatin] Other (See Comments)   myalgias Listed on MAR   Metformin And Related Other (See Comments)   Listed on MAR   Simvastatin Other (See Comments)   Listed on MAR   Verapamil    Sinus arrest      Medication List       Accurate as of 06/23/16 11:59 PM. Always use your most recent med list.          amLODipine 5 MG tablet Commonly known as:  NORVASC Take 5 mg by mouth daily.   aspirin 81 MG chewable tablet Chew 324 mg by mouth daily. 4 tablets   divalproex 125 MG capsule Commonly known as:  DEPAKOTE SPRINKLE Take 2 capsules (250mg ) by mouth each morning and take 3 capsules (375 mg) by mouth every evening.   docusate sodium 100 MG capsule Commonly known as:  COLACE Take 1 capsule (100 mg total) by mouth daily.   escitalopram 5 MG tablet Commonly known as:  LEXAPRO Take 10 mg by mouth daily. 2 tablets   feeding supplement (ENSURE ENLIVE) Liqd Take 237 mLs by mouth 4 (four) times daily -  before meals and at bedtime.   feeding supplement (PRO-STAT SUGAR FREE 64) Liqd Take 30 mLs by mouth 2 (two) times daily.   fluticasone 50 MCG/ACT nasal spray Commonly known as:  FLONASE Place 2 sprays into both nostrils daily.   HYDROcodone-acetaminophen 5-325 MG tablet Commonly known as:  NORCO/VICODIN Take 1 tablet by mouth every 4 (four) hours as needed for moderate pain.   loratadine 10 MG  tablet Commonly known as:  CLARITIN Take 10 mg by mouth daily as needed for allergies.   metoprolol tartrate 25 mg/10 mL Susp Commonly known as:  LOPRESSOR Give 15 mls (75 mg) by mouth twice daily   omeprazole 20 MG capsule Commonly known as:  PRILOSEC Take 20 mg by mouth daily.   THERA VITAMIN PO Take 1 tablet by mouth daily.   UNABLE TO FIND Med Name: Med Pass initiate 4 oz  three times daily       No orders of the defined types were placed in this encounter.   Immunization History  Administered Date(s) Administered  . Influenza Inj Mdck Quad Pf 12/22/2013  . Influenza, Seasonal, Injecte, Preservative Fre 12/02/2010, 12/22/2013  . Influenza,trivalent, recombinat, inj, PF 12/02/2010    . Influenza-Unspecified 12/06/2015  . PPD Test 11/07/2014, 06/08/2015, 06/25/2015  . Pneumococcal Conjugate-13 03/10/1998  . Pneumococcal Polysaccharide-23 10/23/2010  . Zoster 05/05/2007    Social History  Substance Use Topics  . Smoking status: Never Smoker  . Smokeless tobacco: Never Used  . Alcohol use No    Review of Systems  UTO 2/2 dementia    Vitals:   06/23/16 0835  BP: (!) 149/73  Pulse: 86  Resp: 18  Temp: 97 F (36.1 C)   Body mass index is 17.46 kg/m. Physical Exam  GENERAL APPEARANCE: Alert, answers ? Yes or no, No acute distress  SKIN: No diaphoresis rash HEENT: Unremarkable RESPIRATORY: Breathing is even, unlabored. Lung sounds are clear   CARDIOVASCULAR: Heart RRR no murmurs, rubs or gallops. No peripheral edema  GASTROINTESTINAL: Abdomen is soft, non-tender, not distended w/ normal bowel sounds.  GENITOURINARY: Bladder non tender, not distended  MUSCULOSKELETAL: contractures ;wasting NEUROLOGIC: Cranial nerves 2-12 grossly intact ; minimal movement UE PSYCHIATRIC: very sweet, dementia, no behavioral issues  Patient Active Problem List   Diagnosis Date Noted  . Bunion   . Corns and callosities   . Skin ulcers of both feet ([place]) 10/23/2015  . Vitamin B12 deficiency 10/23/2015  . FTT (failure to thrive) in adult 09/01/2015  . Lewy body dementia 09/01/2015  . Corn or callus 02/06/2015  . Dizziness 02/06/2015  . Neuropathy 02/06/2015  . Secondary parkinsonism ([place])   . Depression   . Anxiety   . Essential hypertension   . HLD (hyperlipidemia)   . Nonorganic psychosis   . Atrial fibrillation ([place]) 01/12/2015  . Sepsis ([place]) 01/12/2015  . Ischemic stroke ([place]) 01/12/2015  . Acute encephalopathy   . Paroxysmal atrial fibrillation (H[facility])   . Schizophrenia ([place])   . Atrial fibrillation with RVR ([place]) 01/11/2015  . SVT (supraventricular tachycardia) ([place]) 01/11/2015  . Alzheimer disease   . Diabetes mellitus without complication ([place])   .  HOCM (hypertrophic obstructive cardiomyopathy) ([place])   . HTN (hypertension) 01/07/2015  . Edema 01/06/2015  . Blister of skin without infection 01/05/2015  . Parkinsonian features 12/25/2014  . Weakness 12/18/2014  . Altered mental status 12/11/2014  . UTI (urinary tract infection) 12/02/2014  . GERD (gastroesophageal reflux disease) 12/02/2014  . Psychosis 12/02/2014  . Renal failure (ARF), acute on chronic (HCC) 11/24/2014  . MDD (major depressive disorder) 11/24/2014  . Pressure ulcer 11/24/2014  . Elevated troponin 11/23/2014  . Difficulty hearing 11/07/2014  . Alzheimer's dementia with behavioral disturbance 11/03/2014  . Severe episode of recurrent major depressive disorder, without psychotic features ([place]) 11/02/2014  . Chronic kidney disease (CKD), stage III (moderate) 09/17/2014  . Rotator cuff arthropathy 07/21/2014  . Diabetic polyneuropathy ([place]) 05/21/2014  .  Acid reflux 05/21/2014  . Aortic heart valve narrowing 02/24/2014  . Acquired deformities of toe 07/12/2013  . Hypertrophic obstructive cardiomyopathy ([place]) 07/04/2013  . Left ventricular outflow obstruction 05/31/2012  . Dynamic left ventricular outflow obstruction 05/31/2012  . Left ventricular hypertrophy 05/07/2011  . Sick sinus syndrome ([place]) 05/07/2011  . Cardiac conduction disorder 05/01/2011  . Diabetes mellitus ([place]) 05/01/2011  . Degeneration of intervertebral disc of lumbosacral region 01/27/2011  . Chronic kidney disease 12/16/2010  . Absence of bladder continence 12/16/2010  . Diverticulitis 06/03/2010  . Fibromyalgia 06/03/2010  . Arthritis, degenerative 06/03/2010  . Adenomatous colon polyp 08/03/2006    CMP     Component Value Date/Time   NA 137 04/11/2016   K 4.5 04/11/2016   CL 113 (H) 01/15/2015 0518   CO2 18 (L) 01/15/2015 0518   GLUCOSE 139 (H) 01/15/2015 0518   BUN 14 04/11/2016   CREATININE 0.5 04/11/2016   CREATININE 0.69 01/15/2015 0518   CALCIUM 8.9 01/15/2015 0518   PROT  4.9 (L) 01/14/2015 0154   ALBUMIN 2.0 (L) 01/14/2015 0154   AST 25 01/14/2015 0154   ALT 31 01/14/2015 0154   ALKPHOS 93 01/14/2015 0154   BILITOT 1.1 01/14/2015 0154   GFRNONAA >60 01/15/2015 0518   GFRAA >60 01/15/2015 0518    Recent Labs  04/11/16  NA 137  K 4.5  BUN 14  CREATININE 0.5   No results for input(s): AST, ALT, ALKPHOS, BILITOT, PROT, ALBUMIN in the last 8760 hours. No results for input(s): WBC, NEUTROABS, HGB, HCT, MCV, PLT in the last 8760 hours. No results for input(s): CHOL, LDLCALC, TRIG in the last 8760 hours.  Invalid input(s): HCL No results found for: [facility] Lab Results  Component Value Date   TSH 3.421 01/12/2015   Lab Results  Component Value Date   HGBA1C 6.1 (H) 01/12/2015   Lab Results  Component Value Date   CHOL 217 (H) 01/12/2015   HDL 20 (L) 01/12/2015   LDLCALC 156 (H) 01/12/2015   TRIG 207 (H) 01/12/2015   CHOLHDL 10.9 01/12/2015    Significant Diagnostic Results in last 30 days:  No results found.  Assessment and Plan  Paroxysmal atrial fibrillation (HCC) Stable; plan to cont metoprolol 75 mg BID and prophylaxis with ASA 324 mg daily  GERD (gastroesophageal reflux disease) No reported problems;plan to cont omeprazole 20 mg daily  Diabetes mellitus ([place]) Pt on no meds ; BS run in low 100's; pt is Hospice so blood draws kept to minimum    Cyrilla Durkin D. Sheppard Coil, MD

## 2016-06-25 DIAGNOSIS — I359 Nonrheumatic aortic valve disorder, unspecified: Secondary | ICD-10-CM | POA: Diagnosis not present

## 2016-06-25 DIAGNOSIS — I429 Cardiomyopathy, unspecified: Secondary | ICD-10-CM | POA: Diagnosis not present

## 2016-06-25 DIAGNOSIS — N189 Chronic kidney disease, unspecified: Secondary | ICD-10-CM | POA: Diagnosis not present

## 2016-06-25 DIAGNOSIS — G3183 Dementia with Lewy bodies: Secondary | ICD-10-CM | POA: Diagnosis not present

## 2016-06-25 DIAGNOSIS — I4891 Unspecified atrial fibrillation: Secondary | ICD-10-CM | POA: Diagnosis not present

## 2016-06-25 DIAGNOSIS — I679 Cerebrovascular disease, unspecified: Secondary | ICD-10-CM | POA: Diagnosis not present

## 2016-06-26 DIAGNOSIS — I359 Nonrheumatic aortic valve disorder, unspecified: Secondary | ICD-10-CM | POA: Diagnosis not present

## 2016-06-26 DIAGNOSIS — I679 Cerebrovascular disease, unspecified: Secondary | ICD-10-CM | POA: Diagnosis not present

## 2016-06-26 DIAGNOSIS — I4891 Unspecified atrial fibrillation: Secondary | ICD-10-CM | POA: Diagnosis not present

## 2016-06-26 DIAGNOSIS — I429 Cardiomyopathy, unspecified: Secondary | ICD-10-CM | POA: Diagnosis not present

## 2016-06-26 DIAGNOSIS — N189 Chronic kidney disease, unspecified: Secondary | ICD-10-CM | POA: Diagnosis not present

## 2016-06-26 DIAGNOSIS — G3183 Dementia with Lewy bodies: Secondary | ICD-10-CM | POA: Diagnosis not present

## 2016-06-27 DIAGNOSIS — I4891 Unspecified atrial fibrillation: Secondary | ICD-10-CM | POA: Diagnosis not present

## 2016-06-27 DIAGNOSIS — N189 Chronic kidney disease, unspecified: Secondary | ICD-10-CM | POA: Diagnosis not present

## 2016-06-27 DIAGNOSIS — I429 Cardiomyopathy, unspecified: Secondary | ICD-10-CM | POA: Diagnosis not present

## 2016-06-27 DIAGNOSIS — I359 Nonrheumatic aortic valve disorder, unspecified: Secondary | ICD-10-CM | POA: Diagnosis not present

## 2016-06-27 DIAGNOSIS — G3183 Dementia with Lewy bodies: Secondary | ICD-10-CM | POA: Diagnosis not present

## 2016-06-27 DIAGNOSIS — I679 Cerebrovascular disease, unspecified: Secondary | ICD-10-CM | POA: Diagnosis not present

## 2016-06-30 DIAGNOSIS — I4891 Unspecified atrial fibrillation: Secondary | ICD-10-CM | POA: Diagnosis not present

## 2016-06-30 DIAGNOSIS — I429 Cardiomyopathy, unspecified: Secondary | ICD-10-CM | POA: Diagnosis not present

## 2016-06-30 DIAGNOSIS — N189 Chronic kidney disease, unspecified: Secondary | ICD-10-CM | POA: Diagnosis not present

## 2016-06-30 DIAGNOSIS — I679 Cerebrovascular disease, unspecified: Secondary | ICD-10-CM | POA: Diagnosis not present

## 2016-06-30 DIAGNOSIS — I359 Nonrheumatic aortic valve disorder, unspecified: Secondary | ICD-10-CM | POA: Diagnosis not present

## 2016-06-30 DIAGNOSIS — G3183 Dementia with Lewy bodies: Secondary | ICD-10-CM | POA: Diagnosis not present

## 2016-07-02 DIAGNOSIS — I679 Cerebrovascular disease, unspecified: Secondary | ICD-10-CM | POA: Diagnosis not present

## 2016-07-02 DIAGNOSIS — I359 Nonrheumatic aortic valve disorder, unspecified: Secondary | ICD-10-CM | POA: Diagnosis not present

## 2016-07-02 DIAGNOSIS — G3183 Dementia with Lewy bodies: Secondary | ICD-10-CM | POA: Diagnosis not present

## 2016-07-02 DIAGNOSIS — I429 Cardiomyopathy, unspecified: Secondary | ICD-10-CM | POA: Diagnosis not present

## 2016-07-02 DIAGNOSIS — N189 Chronic kidney disease, unspecified: Secondary | ICD-10-CM | POA: Diagnosis not present

## 2016-07-02 DIAGNOSIS — I4891 Unspecified atrial fibrillation: Secondary | ICD-10-CM | POA: Diagnosis not present

## 2016-07-04 DIAGNOSIS — I679 Cerebrovascular disease, unspecified: Secondary | ICD-10-CM | POA: Diagnosis not present

## 2016-07-04 DIAGNOSIS — I359 Nonrheumatic aortic valve disorder, unspecified: Secondary | ICD-10-CM | POA: Diagnosis not present

## 2016-07-04 DIAGNOSIS — I4891 Unspecified atrial fibrillation: Secondary | ICD-10-CM | POA: Diagnosis not present

## 2016-07-04 DIAGNOSIS — N189 Chronic kidney disease, unspecified: Secondary | ICD-10-CM | POA: Diagnosis not present

## 2016-07-04 DIAGNOSIS — G3183 Dementia with Lewy bodies: Secondary | ICD-10-CM | POA: Diagnosis not present

## 2016-07-04 DIAGNOSIS — I429 Cardiomyopathy, unspecified: Secondary | ICD-10-CM | POA: Diagnosis not present

## 2016-07-07 ENCOUNTER — Non-Acute Institutional Stay (SKILLED_NURSING_FACILITY): Payer: Medicare Other | Admitting: Internal Medicine

## 2016-07-07 ENCOUNTER — Encounter: Payer: Self-pay | Admitting: Internal Medicine

## 2016-07-07 DIAGNOSIS — L89153 Pressure ulcer of sacral region, stage 3: Secondary | ICD-10-CM

## 2016-07-07 DIAGNOSIS — I679 Cerebrovascular disease, unspecified: Secondary | ICD-10-CM | POA: Diagnosis not present

## 2016-07-07 DIAGNOSIS — I4891 Unspecified atrial fibrillation: Secondary | ICD-10-CM | POA: Diagnosis not present

## 2016-07-07 DIAGNOSIS — N189 Chronic kidney disease, unspecified: Secondary | ICD-10-CM | POA: Diagnosis not present

## 2016-07-07 DIAGNOSIS — G3183 Dementia with Lewy bodies: Secondary | ICD-10-CM | POA: Diagnosis not present

## 2016-07-07 DIAGNOSIS — I359 Nonrheumatic aortic valve disorder, unspecified: Secondary | ICD-10-CM | POA: Diagnosis not present

## 2016-07-07 DIAGNOSIS — I429 Cardiomyopathy, unspecified: Secondary | ICD-10-CM | POA: Diagnosis not present

## 2016-07-07 NOTE — Progress Notes (Signed)
Location:  [facility] Room Number: 202D Place of Service:  SNF [id number])  [name], MD  Patient Care Team: Hennie Duos, MD as PCP - General (Internal Medicine)  Extended Emergency Contact Information Primary Emergency Contact: Lehrmann,James Address: [address] [place], [place] [address] [place] Phone: [phone] Relation: Son Secondary Emergency Contact: Rudell Cobb [place] of [place] Phone: [phone] Relation: Other    Allergies: Cymbalta [duloxetine hcl]; Lipitor [atorvastatin]; Metformin and related; Simvastatin; and Verapamil  Chief Complaint  Patient presents with  . Medical Management of Chronic Issues    Routine Visit    HPI: Patient is 83 y.o. female Hospice pt, who the wound nurse asked me to see. Pt has necrotic skin on her sacral wound which needs debridement. It is getting larger and there is much odor. The wound care provider will not be here for a while.There is no redness or infection, and no more pain than usual.   Past Medical History:  Diagnosis Date  . Alzheimer disease   . Atrial fibrillation ([place])   . Bunion   . Chronic kidney disease   . Colon polyp   . Corns and callosities   . CRI (chronic renal insufficiency) 12/16/2010   Nephrology, Laurance Flatten, MD  . Diabetes mellitus without complication ([place]) 74/25/9563  . Dizzy   . Dynamic left ventricular outflow obstruction 05/31/2012   Resting gradient 52 mm hg  . Fibromyalgia   . GERD (gastroesophageal reflux disease)   . HOCM (hypertrophic obstructive cardiomyopathy) ([place])   . Hypertension   . Neuropathy   . Sick sinus syndrome ([place]) 05/07/2011    Past Surgical History:  Procedure Laterality Date  . COLONOSCOPY    . EYE SURGERY     cataract  . HEMORRHOID SURGERY    . SHOULDER SURGERY Left    rotator cuff  . UPPER GASTROINTESTINAL ENDOSCOPY      Allergies as of 07/07/2016      Reactions   Cymbalta  [duloxetine Hcl] Other (See Comments)   Diarrhea Listed on MAR   Lipitor [atorvastatin] Other (See Comments)   myalgias Listed on MAR   Metformin And Related Other (See Comments)   Listed on MAR   Simvastatin Other (See Comments)   Listed on MAR   Verapamil    Sinus arrest      Medication List       Accurate as of 07/07/16  1:51 PM. Always use your most recent med list.          amLODipine 5 MG tablet Commonly known as:  NORVASC Take 5 mg by mouth daily.   aspirin 81 MG chewable tablet Chew 324 mg by mouth daily. 4 tablets   divalproex 125 MG capsule Commonly known as:  DEPAKOTE SPRINKLE Take 2 capsules (250mg ) by mouth each morning and take 3 capsules (375 mg) by mouth every evening.   docusate sodium 100 MG capsule Commonly known as:  COLACE Take 1 capsule (100 mg total) by mouth daily.   escitalopram 5 MG tablet Commonly known as:  LEXAPRO Take 10 mg by mouth daily. 2 tablets   feeding supplement (ENSURE ENLIVE) Liqd Take 237 mLs by mouth 4 (four) times daily -  before meals and at bedtime.   feeding supplement (PRO-STAT SUGAR FREE 64) Liqd Take 30 mLs by mouth 2 (two) times daily.   fluticasone 50 MCG/ACT nasal spray Commonly known as:  FLONASE Place 2 sprays into both nostrils daily.   HYDROcodone-acetaminophen 5-325 MG tablet Commonly known as:  NORCO/VICODIN Take 1 tablet by mouth every 4 (four) hours as needed for moderate pain.   loratadine 10 MG tablet Commonly known as:  CLARITIN Take 10 mg by mouth daily as needed for allergies.   metoprolol tartrate 25 mg/10 mL Susp Commonly known as:  LOPRESSOR Give 15 mls (75 mg) by mouth twice daily   omeprazole 20 MG capsule Commonly known as:  PRILOSEC Take 20 mg by mouth daily.   THERA VITAMIN PO Take 1 tablet by mouth daily.   UNABLE TO FIND Med Name: Med Pass initiate 4 oz  three times daily       No orders of the defined types were placed in this encounter.   Immunization History    Administered Date(s) Administered  . Influenza Inj Mdck Quad Pf 12/22/2013  . Influenza, Seasonal, Injecte, Preservative Fre 12/02/2010  . Influenza-Unspecified 12/06/2015  . PPD Test 11/07/2014, 06/08/2015, 06/25/2015  . Pneumococcal Conjugate-13 03/10/1998  . Pneumococcal Polysaccharide-23 10/23/2010  . Zoster 05/05/2007    Social History  Substance Use Topics  . Smoking status: Never Smoker  . Smokeless tobacco: Never Used  . Alcohol use No    Review of Systems  UTO 2/2 dementia      Vitals:   07/07/16 1346  BP: (!) 149/73  Pulse: 90  Resp: 16  Temp: 97 F (36.1 C)   Body mass index is 16.83 kg/m. Physical Exam  GENERAL APPEARANCE: Alert, min conversant, No acute distress  SKIN: approx 8 cm by 8cm scaral ulcer to muscle with necrotic skin edges medially; I don't see exposed bone HEENT: Unremarkable RESPIRATORY: Breathing is even, unlabored. Lung sounds are clear   CARDIOVASCULAR: Heart RRR no murmurs, rubs or gallops. No peripheral edema  GASTROINTESTINAL: Abdomen is soft, non-tender, not distended w/ normal bowel sounds.  GENITOURINARY: Bladder non tender, not distended  MUSCULOSKELETAL: No abnormal joints or musculature NEUROLOGIC: Cranial nerves 2-12 grossly intact; quadriplegia with some movement of BUE PSYCHIATRIC: very sweet, dementia  Patient Active Problem List   Diagnosis Date Noted  . Bunion   . Corns and callosities   . Skin ulcers of both feet ([place]) 10/23/2015  . Vitamin B12 deficiency 10/23/2015  . FTT (failure to thrive) in adult 09/01/2015  . Lewy body dementia 09/01/2015  . Corn or callus 02/06/2015  . Dizziness 02/06/2015  . Neuropathy 02/06/2015  . Secondary parkinsonism ([place])   . Depression   . Anxiety   . Essential hypertension   . HLD (hyperlipidemia)   . Nonorganic psychosis   . Atrial fibrillation ([place]) 01/12/2015  . Sepsis ([place]) 01/12/2015  . Ischemic stroke ([place]) 01/12/2015  . Acute encephalopathy   . Paroxysmal atrial  fibrillation (HCC)   . Schizophrenia ([place])   . Atrial fibrillation with RVR ([place]) 01/11/2015  . SVT (supraventricular tachycardia) ([place]) 01/11/2015  . Alzheimer disease   . Diabetes mellitus without complication ([place])   . HOCM (hypertrophic obstructive cardiomyopathy) ([place])   . HTN (hypertension) 01/07/2015  . Edema 01/06/2015  . Blister of skin without infection 01/05/2015  . Parkinsonian features 12/25/2014  . Weakness 12/18/2014  . Altered mental status 12/11/2014  . UTI (urinary tract infection) 12/02/2014  . GERD (gastroesophageal reflux disease) 12/02/2014  . Psychosis 12/02/2014  . Renal failure (ARF), acute on chronic (HCC) 11/24/2014  . MDD (major depressive disorder) 11/24/2014  . Pressure ulcer 11/24/2014  . Elevated troponin 11/23/2014  . Difficulty  hearing 11/07/2014  . Alzheimer's dementia with behavioral disturbance 11/03/2014  . Severe episode of recurrent major depressive disorder, without psychotic features ([place]) 11/02/2014  . Chronic kidney disease (CKD), stage III (moderate) 09/17/2014  . Rotator cuff arthropathy 07/21/2014  . Diabetic polyneuropathy ([place]) 05/21/2014  . Acid reflux 05/21/2014  . Aortic heart valve narrowing 02/24/2014  . Acquired deformities of toe 07/12/2013  . Hypertrophic obstructive cardiomyopathy ([place]) 07/04/2013  . Left ventricular outflow obstruction 05/31/2012  . Dynamic left ventricular outflow obstruction 05/31/2012  . Left ventricular hypertrophy 05/07/2011  . Sick sinus syndrome ([place]) 05/07/2011  . Cardiac conduction disorder 05/01/2011  . Diabetes mellitus ([place]) 05/01/2011  . Degeneration of intervertebral disc of lumbosacral region 01/27/2011  . Chronic kidney disease 12/16/2010  . Absence of bladder continence 12/16/2010  . Diverticulitis 06/03/2010  . Fibromyalgia 06/03/2010  . Arthritis, degenerative 06/03/2010  . Adenomatous colon polyp 08/03/2006    CMP     Component Value Date/Time   NA 137 04/11/2016   K 4.5  04/11/2016   CL 113 (H) 01/15/2015 0518   CO2 18 (L) 01/15/2015 0518   GLUCOSE 139 (H) 01/15/2015 0518   BUN 14 04/11/2016   CREATININE 0.5 04/11/2016   CREATININE 0.69 01/15/2015 0518   CALCIUM 8.9 01/15/2015 0518   PROT 4.9 (L) 01/14/2015 0154   ALBUMIN 2.0 (L) 01/14/2015 0154   AST 25 01/14/2015 0154   ALT 31 01/14/2015 0154   ALKPHOS 93 01/14/2015 0154   BILITOT 1.1 01/14/2015 0154   GFRNONAA >60 01/15/2015 0518   GFRAA >60 01/15/2015 0518    Recent Labs  04/11/16  NA 137  K 4.5  BUN 14  CREATININE 0.5   No results for input(s): AST, ALT, ALKPHOS, BILITOT, PROT, ALBUMIN in the last 8760 hours. No results for input(s): WBC, NEUTROABS, HGB, HCT, MCV, PLT in the last 8760 hours. No results for input(s): CHOL, LDLCALC, TRIG in the last 8760 hours.  Invalid input(s): HCL No results found for: [facility] Lab Results  Component Value Date   TSH 3.421 01/12/2015   Lab Results  Component Value Date   HGBA1C 6.1 (H) 01/12/2015   Lab Results  Component Value Date   CHOL 217 (H) 01/12/2015   HDL 20 (L) 01/12/2015   LDLCALC 156 (H) 01/12/2015   TRIG 207 (H) 01/12/2015   CHOLHDL 10.9 01/12/2015    Significant Diagnostic Results in last 30 days:  No results found.  Assessment and Plan  SACRAL PRESSURE ULCER STAGE 3 WITH NECROSIS -  After sterile prep medial edge, about 30% of wound was sharply debrided Full-thickness skin and subcutaneous Fat and fascia with a 15 blade; there was minimal bleeding; inside of wound was explored-there was some exudate but no pus or infection; wound was then dressed      Webb Silversmith D. Sheppard Coil, MD

## 2016-07-08 DIAGNOSIS — R630 Anorexia: Secondary | ICD-10-CM | POA: Diagnosis not present

## 2016-07-08 DIAGNOSIS — K219 Gastro-esophageal reflux disease without esophagitis: Secondary | ICD-10-CM | POA: Diagnosis not present

## 2016-07-08 DIAGNOSIS — I429 Cardiomyopathy, unspecified: Secondary | ICD-10-CM | POA: Diagnosis not present

## 2016-07-08 DIAGNOSIS — E1159 Type 2 diabetes mellitus with other circulatory complications: Secondary | ICD-10-CM | POA: Diagnosis not present

## 2016-07-08 DIAGNOSIS — J309 Allergic rhinitis, unspecified: Secondary | ICD-10-CM | POA: Diagnosis not present

## 2016-07-08 DIAGNOSIS — N189 Chronic kidney disease, unspecified: Secondary | ICD-10-CM | POA: Diagnosis not present

## 2016-07-08 DIAGNOSIS — L8990 Pressure ulcer of unspecified site, unspecified stage: Secondary | ICD-10-CM | POA: Diagnosis not present

## 2016-07-08 DIAGNOSIS — I10 Essential (primary) hypertension: Secondary | ICD-10-CM | POA: Diagnosis not present

## 2016-07-08 DIAGNOSIS — R634 Abnormal weight loss: Secondary | ICD-10-CM | POA: Diagnosis not present

## 2016-07-08 DIAGNOSIS — I679 Cerebrovascular disease, unspecified: Secondary | ICD-10-CM | POA: Diagnosis not present

## 2016-07-08 DIAGNOSIS — M2450 Contracture, unspecified joint: Secondary | ICD-10-CM | POA: Diagnosis not present

## 2016-07-08 DIAGNOSIS — R1310 Dysphagia, unspecified: Secondary | ICD-10-CM | POA: Diagnosis not present

## 2016-07-08 DIAGNOSIS — G9009 Other idiopathic peripheral autonomic neuropathy: Secondary | ICD-10-CM | POA: Diagnosis not present

## 2016-07-08 DIAGNOSIS — G3183 Dementia with Lewy bodies: Secondary | ICD-10-CM | POA: Diagnosis not present

## 2016-07-08 DIAGNOSIS — I4891 Unspecified atrial fibrillation: Secondary | ICD-10-CM | POA: Diagnosis not present

## 2016-07-08 DIAGNOSIS — E785 Hyperlipidemia, unspecified: Secondary | ICD-10-CM | POA: Diagnosis not present

## 2016-07-08 DIAGNOSIS — I359 Nonrheumatic aortic valve disorder, unspecified: Secondary | ICD-10-CM | POA: Diagnosis not present

## 2016-07-08 DIAGNOSIS — F319 Bipolar disorder, unspecified: Secondary | ICD-10-CM | POA: Diagnosis not present

## 2016-07-09 DIAGNOSIS — I429 Cardiomyopathy, unspecified: Secondary | ICD-10-CM | POA: Diagnosis not present

## 2016-07-09 DIAGNOSIS — I359 Nonrheumatic aortic valve disorder, unspecified: Secondary | ICD-10-CM | POA: Diagnosis not present

## 2016-07-09 DIAGNOSIS — I679 Cerebrovascular disease, unspecified: Secondary | ICD-10-CM | POA: Diagnosis not present

## 2016-07-09 DIAGNOSIS — G3183 Dementia with Lewy bodies: Secondary | ICD-10-CM | POA: Diagnosis not present

## 2016-07-09 DIAGNOSIS — I4891 Unspecified atrial fibrillation: Secondary | ICD-10-CM | POA: Diagnosis not present

## 2016-07-09 DIAGNOSIS — N189 Chronic kidney disease, unspecified: Secondary | ICD-10-CM | POA: Diagnosis not present

## 2016-07-11 DIAGNOSIS — I359 Nonrheumatic aortic valve disorder, unspecified: Secondary | ICD-10-CM | POA: Diagnosis not present

## 2016-07-11 DIAGNOSIS — G3183 Dementia with Lewy bodies: Secondary | ICD-10-CM | POA: Diagnosis not present

## 2016-07-11 DIAGNOSIS — I429 Cardiomyopathy, unspecified: Secondary | ICD-10-CM | POA: Diagnosis not present

## 2016-07-11 DIAGNOSIS — I679 Cerebrovascular disease, unspecified: Secondary | ICD-10-CM | POA: Diagnosis not present

## 2016-07-11 DIAGNOSIS — N189 Chronic kidney disease, unspecified: Secondary | ICD-10-CM | POA: Diagnosis not present

## 2016-07-11 DIAGNOSIS — I4891 Unspecified atrial fibrillation: Secondary | ICD-10-CM | POA: Diagnosis not present

## 2016-07-12 DIAGNOSIS — N189 Chronic kidney disease, unspecified: Secondary | ICD-10-CM | POA: Diagnosis not present

## 2016-07-12 DIAGNOSIS — I429 Cardiomyopathy, unspecified: Secondary | ICD-10-CM | POA: Diagnosis not present

## 2016-07-12 DIAGNOSIS — G3183 Dementia with Lewy bodies: Secondary | ICD-10-CM | POA: Diagnosis not present

## 2016-07-12 DIAGNOSIS — I359 Nonrheumatic aortic valve disorder, unspecified: Secondary | ICD-10-CM | POA: Diagnosis not present

## 2016-07-12 DIAGNOSIS — I4891 Unspecified atrial fibrillation: Secondary | ICD-10-CM | POA: Diagnosis not present

## 2016-07-12 DIAGNOSIS — I679 Cerebrovascular disease, unspecified: Secondary | ICD-10-CM | POA: Diagnosis not present

## 2016-07-14 ENCOUNTER — Non-Acute Institutional Stay (SKILLED_NURSING_FACILITY): Payer: Medicare Other | Admitting: Internal Medicine

## 2016-07-14 ENCOUNTER — Encounter: Payer: Self-pay | Admitting: Internal Medicine

## 2016-07-14 DIAGNOSIS — I679 Cerebrovascular disease, unspecified: Secondary | ICD-10-CM | POA: Diagnosis not present

## 2016-07-14 DIAGNOSIS — F329 Major depressive disorder, single episode, unspecified: Secondary | ICD-10-CM

## 2016-07-14 DIAGNOSIS — F0281 Dementia in other diseases classified elsewhere with behavioral disturbance: Secondary | ICD-10-CM

## 2016-07-14 DIAGNOSIS — I429 Cardiomyopathy, unspecified: Secondary | ICD-10-CM | POA: Diagnosis not present

## 2016-07-14 DIAGNOSIS — F32A Depression, unspecified: Secondary | ICD-10-CM

## 2016-07-14 DIAGNOSIS — I480 Paroxysmal atrial fibrillation: Secondary | ICD-10-CM | POA: Diagnosis not present

## 2016-07-14 DIAGNOSIS — G3183 Dementia with Lewy bodies: Secondary | ICD-10-CM

## 2016-07-14 DIAGNOSIS — I359 Nonrheumatic aortic valve disorder, unspecified: Secondary | ICD-10-CM | POA: Diagnosis not present

## 2016-07-14 DIAGNOSIS — I4891 Unspecified atrial fibrillation: Secondary | ICD-10-CM | POA: Diagnosis not present

## 2016-07-14 DIAGNOSIS — N189 Chronic kidney disease, unspecified: Secondary | ICD-10-CM | POA: Diagnosis not present

## 2016-07-14 NOTE — Progress Notes (Signed)
Location:  [facility] Room Number: 202D Place of Service:  SNF (31)  Hennie Duos, MD  Patient Care Team: Hennie Duos, MD as PCP - General (Internal Medicine)  Extended Emergency Contact Information Primary Emergency Contact: Warehime,James Address: [address] [place], [place] [address] [place] Phone: [phone] Relation: Son Secondary Emergency Contact: Rudell Cobb [place] of [place] Phone: [phone] Relation: Other    Allergies: Cymbalta [duloxetine hcl]; Lipitor [atorvastatin]; Metformin and related; Simvastatin; and Verapamil  Chief Complaint  Patient presents with  . Medical Management of Chronic Issues    Routine Visit    HPI: Patient is 83 y.o. female who is being sen for routine issues of PAF, Lewy body dementia and depression.  Past Medical History:  Diagnosis Date  . Alzheimer disease   . Atrial fibrillation ([place])   . Bunion   . Chronic kidney disease   . Colon polyp   . Corns and callosities   . CRI (chronic renal insufficiency) 12/16/2010   Nephrology, Laurance Flatten, MD  . Diabetes mellitus without complication ([place]) 62/94/7654  . Dizzy   . Dynamic left ventricular outflow obstruction 05/31/2012   Resting gradient 52 mm hg  . Fibromyalgia   . GERD (gastroesophageal reflux disease)   . HOCM (hypertrophic obstructive cardiomyopathy) ([place])   . Hypertension   . Neuropathy   . Sick sinus syndrome ([place]) 05/07/2011    Past Surgical History:  Procedure Laterality Date  . COLONOSCOPY    . EYE SURGERY     cataract  . HEMORRHOID SURGERY    . SHOULDER SURGERY Left    rotator cuff  . UPPER GASTROINTESTINAL ENDOSCOPY      Allergies as of 07/14/2016      Reactions   Cymbalta [duloxetine Hcl] Other (See Comments)   Diarrhea Listed on MAR   Lipitor [atorvastatin] Other (See Comments)   myalgias Listed on MAR   Metformin And Related Other (See Comments)   Listed on MAR    Simvastatin Other (See Comments)   Listed on MAR   Verapamil    Sinus arrest      Medication List       Accurate as of 07/14/16 11:59 PM. Always use your most recent med list.          amLODipine 5 MG tablet Commonly known as:  NORVASC Take 5 mg by mouth daily.   aspirin 81 MG chewable tablet Chew 324 mg by mouth daily. 4 tablets   divalproex 125 MG capsule Commonly known as:  DEPAKOTE SPRINKLE Take 2 capsules (250mg ) by mouth each morning and take 3 capsules (375 mg) by mouth every evening.   docusate sodium 100 MG capsule Commonly known as:  COLACE Take 1 capsule (100 mg total) by mouth daily.   escitalopram 5 MG tablet Commonly known as:  LEXAPRO Take 10 mg by mouth daily. 2 tablets   feeding supplement (ENSURE ENLIVE) Liqd Take 237 mLs by mouth 4 (four) times daily -  before meals and at bedtime.   feeding supplement (PRO-STAT SUGAR FREE 64) Liqd Take 30 mLs by mouth 2 (two) times daily.   fluticasone 50 MCG/ACT nasal spray Commonly known as:  FLONASE Place 2 sprays into both nostrils daily.   HYDROcodone-acetaminophen 5-325 MG tablet Commonly known as:  NORCO/VICODIN Take 1 tablet by mouth every 4 (four) hours as needed for moderate pain.   loratadine 10 MG  tablet Commonly known as:  CLARITIN Take 10 mg by mouth daily as needed for allergies.   metoprolol tartrate 25 mg/10 mL Susp Commonly known as:  LOPRESSOR Give 15 mls (75 mg) by mouth twice daily   omeprazole 20 MG capsule Commonly known as:  PRILOSEC Take 20 mg by mouth daily.   THERA VITAMIN PO Take 1 tablet by mouth daily.   UNABLE TO FIND Med Name: Med Pass initiate 4 oz  three times daily       No orders of the defined types were placed in this encounter.   Immunization History  Administered Date(s) Administered  . Influenza Inj Mdck Quad Pf 12/22/2013  . Influenza, Seasonal, Injecte, Preservative Fre 12/02/2010, 12/22/2013  . Influenza,trivalent, recombinat, inj, PF 12/02/2010   . Influenza-Unspecified 12/06/2015  . PPD Test 11/07/2014, 06/08/2015, 06/25/2015  . Pneumococcal Conjugate-13 03/10/1998  . Pneumococcal Polysaccharide-23 10/23/2010  . Zoster 05/05/2007    Social History  Substance Use Topics  . Smoking status: Never Smoker  . Smokeless tobacco: Never Used  . Alcohol use No    Review of Systems  UTO 2/2 dementia; per nursing no new concerns    Vitals:   07/14/16 1401  BP: (!) 149/73  Pulse: 90  Resp: 18  Temp: 98.3 F (36.8 C)   Body mass index is 16.95 kg/m. Physical Exam  GENERAL APPEARANCE: Alert, non conversant, No acute distress, very sweet lady  SKIN: No diaphoresis rash HEENT: Unremarkable RESPIRATORY: Breathing is even, unlabored. Lung sounds are clear   CARDIOVASCULAR: Heart RRR no murmurs, rubs or gallops. No peripheral edema  GASTROINTESTINAL: Abdomen is soft, non-tender, not distended w/ normal bowel sounds.  GENITOURINARY: Bladder non tender, not distended  MUSCULOSKELETAL: No abnormal joints or musculature NEUROLOGIC: Cranial nerves 2-12 grossly intact; quadriplegia PSYCHIATRIC: Mood and affect with dementia  Patient Active Problem List   Diagnosis Date Noted  . Bunion   . Corns and callosities   . Skin ulcers of both feet ([place]) 10/23/2015  . Vitamin B12 deficiency 10/23/2015  . FTT (failure to thrive) in adult 09/01/2015  . Lewy body dementia 09/01/2015  . Corn or callus 02/06/2015  . Dizziness 02/06/2015  . Neuropathy 02/06/2015  . Secondary parkinsonism ([place])   . Depression   . Anxiety   . Essential hypertension   . HLD (hyperlipidemia)   . Nonorganic psychosis   . Atrial fibrillation ([place]) 01/12/2015  . Sepsis ([place]) 01/12/2015  . Ischemic stroke ([place]) 01/12/2015  . Acute encephalopathy   . Paroxysmal atrial fibrillation (HCC)   . Schizophrenia ([place])   . Atrial fibrillation with RVR ([place]) 01/11/2015  . SVT (supraventricular tachycardia) ([place]) 01/11/2015  . Alzheimer disease   . Diabetes mellitus  without complication ([place])   . HOCM (hypertrophic obstructive cardiomyopathy) ([place])   . HTN (hypertension) 01/07/2015  . Edema 01/06/2015  . Blister of skin without infection 01/05/2015  . Parkinsonian features 12/25/2014  . Weakness 12/18/2014  . Altered mental status 12/11/2014  . UTI (urinary tract infection) 12/02/2014  . GERD (gastroesophageal reflux disease) 12/02/2014  . Psychosis 12/02/2014  . Renal failure (ARF), acute on chronic (HCC) 11/24/2014  . MDD (major depressive disorder) 11/24/2014  . Pressure ulcer 11/24/2014  . Elevated troponin 11/23/2014  . Difficulty hearing 11/07/2014  . Alzheimer's dementia with behavioral disturbance 11/03/2014  . Severe episode of recurrent major depressive disorder, without psychotic features ([place]) 11/02/2014  . Chronic kidney disease (CKD), stage III (moderate) 09/17/2014  . Rotator cuff arthropathy 07/21/2014  . Diabetic polyneuropathy (  [place]) 05/21/2014  . Acid reflux 05/21/2014  . Aortic heart valve narrowing 02/24/2014  . Acquired deformities of toe 07/12/2013  . Hypertrophic obstructive cardiomyopathy ([place]) 07/04/2013  . Left ventricular outflow obstruction 05/31/2012  . Dynamic left ventricular outflow obstruction 05/31/2012  . Left ventricular hypertrophy 05/07/2011  . Sick sinus syndrome ([place]) 05/07/2011  . Cardiac conduction disorder 05/01/2011  . Diabetes mellitus ([place]) 05/01/2011  . Degeneration of intervertebral disc of lumbosacral region 01/27/2011  . Chronic kidney disease 12/16/2010  . Absence of bladder continence 12/16/2010  . Diverticulitis 06/03/2010  . Fibromyalgia 06/03/2010  . Arthritis, degenerative 06/03/2010  . Adenomatous colon polyp 08/03/2006    CMP     Component Value Date/Time   NA 137 04/11/2016   K 4.5 04/11/2016   CL 113 (H) 01/15/2015 0518   CO2 18 (L) 01/15/2015 0518   GLUCOSE 139 (H) 01/15/2015 0518   BUN 14 04/11/2016   CREATININE 0.5 04/11/2016   CREATININE 0.69 01/15/2015 0518    CALCIUM 8.9 01/15/2015 0518   PROT 4.9 (L) 01/14/2015 0154   ALBUMIN 2.0 (L) 01/14/2015 0154   AST 25 01/14/2015 0154   ALT 31 01/14/2015 0154   ALKPHOS 93 01/14/2015 0154   BILITOT 1.1 01/14/2015 0154   GFRNONAA >60 01/15/2015 0518   GFRAA >60 01/15/2015 0518    Recent Labs  04/11/16  NA 137  K 4.5  BUN 14  CREATININE 0.5   No results for input(s): AST, ALT, ALKPHOS, BILITOT, PROT, ALBUMIN in the last 8760 hours. No results for input(s): WBC, NEUTROABS, HGB, HCT, MCV, PLT in the last 8760 hours. No results for input(s): CHOL, LDLCALC, TRIG in the last 8760 hours.  Invalid input(s): HCL No results found for: [facility] Lab Results  Component Value Date   TSH 3.421 01/12/2015   Lab Results  Component Value Date   HGBA1C 6.1 (H) 01/12/2015   Lab Results  Component Value Date   CHOL 217 (H) 01/12/2015   HDL 20 (L) 01/12/2015   LDLCALC 156 (H) 01/12/2015   TRIG 207 (H) 01/12/2015   CHOLHDL 10.9 01/12/2015    Significant Diagnostic Results in last 30 days:  No results found.  Assessment and Plan  Atrial fibrillation (HCC) Chronic and stable; cont metoprolol 75 mg BID and prophylaxis with ASA 81 mg daily  Lewy body dementia Slow and steady decline; not on any dementia specific meds; cont for behavoirs depakote 250 mg qAM and 375 mg qHS  Depression Stable, pt appears content;cont lexapro 10 mg daily     Lavon Horn D. Sheppard Coil, MD

## 2016-07-16 DIAGNOSIS — I679 Cerebrovascular disease, unspecified: Secondary | ICD-10-CM | POA: Diagnosis not present

## 2016-07-16 DIAGNOSIS — I4891 Unspecified atrial fibrillation: Secondary | ICD-10-CM | POA: Diagnosis not present

## 2016-07-16 DIAGNOSIS — I359 Nonrheumatic aortic valve disorder, unspecified: Secondary | ICD-10-CM | POA: Diagnosis not present

## 2016-07-16 DIAGNOSIS — N189 Chronic kidney disease, unspecified: Secondary | ICD-10-CM | POA: Diagnosis not present

## 2016-07-16 DIAGNOSIS — G3183 Dementia with Lewy bodies: Secondary | ICD-10-CM | POA: Diagnosis not present

## 2016-07-16 DIAGNOSIS — I429 Cardiomyopathy, unspecified: Secondary | ICD-10-CM | POA: Diagnosis not present

## 2016-07-18 DIAGNOSIS — I359 Nonrheumatic aortic valve disorder, unspecified: Secondary | ICD-10-CM | POA: Diagnosis not present

## 2016-07-18 DIAGNOSIS — I4891 Unspecified atrial fibrillation: Secondary | ICD-10-CM | POA: Diagnosis not present

## 2016-07-18 DIAGNOSIS — G3183 Dementia with Lewy bodies: Secondary | ICD-10-CM | POA: Diagnosis not present

## 2016-07-18 DIAGNOSIS — I679 Cerebrovascular disease, unspecified: Secondary | ICD-10-CM | POA: Diagnosis not present

## 2016-07-18 DIAGNOSIS — I429 Cardiomyopathy, unspecified: Secondary | ICD-10-CM | POA: Diagnosis not present

## 2016-07-18 DIAGNOSIS — N189 Chronic kidney disease, unspecified: Secondary | ICD-10-CM | POA: Diagnosis not present

## 2016-07-21 DIAGNOSIS — N189 Chronic kidney disease, unspecified: Secondary | ICD-10-CM | POA: Diagnosis not present

## 2016-07-21 DIAGNOSIS — G3183 Dementia with Lewy bodies: Secondary | ICD-10-CM | POA: Diagnosis not present

## 2016-07-21 DIAGNOSIS — I359 Nonrheumatic aortic valve disorder, unspecified: Secondary | ICD-10-CM | POA: Diagnosis not present

## 2016-07-21 DIAGNOSIS — I429 Cardiomyopathy, unspecified: Secondary | ICD-10-CM | POA: Diagnosis not present

## 2016-07-21 DIAGNOSIS — I4891 Unspecified atrial fibrillation: Secondary | ICD-10-CM | POA: Diagnosis not present

## 2016-07-21 DIAGNOSIS — I679 Cerebrovascular disease, unspecified: Secondary | ICD-10-CM | POA: Diagnosis not present

## 2016-07-22 ENCOUNTER — Encounter: Payer: Self-pay | Admitting: Internal Medicine

## 2016-07-22 ENCOUNTER — Non-Acute Institutional Stay (SKILLED_NURSING_FACILITY): Payer: Medicare Other | Admitting: Internal Medicine

## 2016-07-22 DIAGNOSIS — T783XXA Angioneurotic edema, initial encounter: Secondary | ICD-10-CM | POA: Diagnosis not present

## 2016-07-22 NOTE — Progress Notes (Signed)
Location:  [facility] Room Number: 202D Place of Service:  SNF (31)  Hennie Duos, MD  Patient Care Team: Hennie Duos, MD as PCP - General (Internal Medicine)  Extended Emergency Contact Information Primary Emergency Contact: Fishburn,James Address: [address] [place], [place] [address] [place] Phone: [phone] Relation: Son Secondary Emergency Contact: Rudell Cobb [place] of [place] Phone: [phone] Relation: Other    Allergies: Cymbalta [duloxetine hcl]; Lipitor [atorvastatin]; Metformin and related; Simvastatin; and Verapamil  Chief Complaint  Patient presents with  . Acute Visit    Acute    HPI: Patient is 83 y.o. female who nursing asked me to see ; She has some swelling of her lips. New exposures? Noted today. Pt with dementia and Hospice pt.  Past Medical History:  Diagnosis Date  . Alzheimer disease   . Atrial fibrillation ([place])   . Bunion   . Chronic kidney disease   . Colon polyp   . Corns and callosities   . CRI (chronic renal insufficiency) 12/16/2010   Nephrology, Laurance Flatten, MD  . Diabetes mellitus without complication ([place]) 31/54/0086  . Dizzy   . Dynamic left ventricular outflow obstruction 05/31/2012   Resting gradient 52 mm hg  . Fibromyalgia   . GERD (gastroesophageal reflux disease)   . HOCM (hypertrophic obstructive cardiomyopathy) ([place])   . Hypertension   . Neuropathy   . Sick sinus syndrome ([place]) 05/07/2011    Past Surgical History:  Procedure Laterality Date  . COLONOSCOPY    . EYE SURGERY     cataract  . HEMORRHOID SURGERY    . SHOULDER SURGERY Left    rotator cuff  . UPPER GASTROINTESTINAL ENDOSCOPY      Allergies as of 07/22/2016      Reactions   Cymbalta [duloxetine Hcl] Other (See Comments)   Diarrhea Listed on MAR   Lipitor [atorvastatin] Other (See Comments)   myalgias Listed on MAR   Metformin And Related Other (See Comments)     Listed on MAR   Simvastatin Other (See Comments)   Listed on MAR   Verapamil    Sinus arrest      Medication List       Accurate as of 07/22/16  2:22 PM. Always use your most recent med list.          amLODipine 5 MG tablet Commonly known as:  NORVASC Take 5 mg by mouth daily.   aspirin 81 MG chewable tablet Chew 324 mg by mouth daily. 4 tablets   divalproex 125 MG capsule Commonly known as:  DEPAKOTE SPRINKLE Take 2 capsules (250mg ) by mouth each morning and take 3 capsules (375 mg) by mouth every evening.   docusate sodium 100 MG capsule Commonly known as:  COLACE Take 1 capsule (100 mg total) by mouth daily.   escitalopram 5 MG tablet Commonly known as:  LEXAPRO Take 10 mg by mouth daily. 2 tablets   feeding supplement (ENSURE ENLIVE) Liqd Take 237 mLs by mouth 4 (four) times daily -  before meals and at bedtime.   feeding supplement (PRO-STAT SUGAR FREE 64) Liqd Take 30 mLs by mouth 2 (two) times daily.   fluticasone 50 MCG/ACT nasal spray Commonly known as:  FLONASE Place 2 sprays into both nostrils daily.   HYDROcodone-acetaminophen 5-325 MG tablet Commonly known as:  NORCO/VICODIN Take 1 tablet by mouth every 4 (four) hours as needed  for moderate pain.   loratadine 10 MG tablet Commonly known as:  CLARITIN Take 10 mg by mouth daily as needed for allergies.   metoprolol tartrate 25 mg/10 mL Susp Commonly known as:  LOPRESSOR Give 15 mls (75 mg) by mouth twice daily   omeprazole 20 MG capsule Commonly known as:  PRILOSEC Take 20 mg by mouth daily.   THERA VITAMIN PO Take 1 tablet by mouth daily.   UNABLE TO FIND Med Name: Med Pass initiate 4 oz  three times daily       No orders of the defined types were placed in this encounter.   Immunization History  Administered Date(s) Administered  . Influenza Inj Mdck Quad Pf 12/22/2013  . Influenza, Seasonal, Injecte, Preservative Fre 12/02/2010, 12/22/2013  . Influenza,trivalent, recombinat,  inj, PF 12/02/2010  . Influenza-Unspecified 12/06/2015  . PPD Test 11/07/2014, 06/08/2015, 06/25/2015  . Pneumococcal Conjugate-13 03/10/1998  . Pneumococcal Polysaccharide-23 10/23/2010  . Zoster 05/05/2007    Social History  Substance Use Topics  . Smoking status: Never Smoker  . Smokeless tobacco: Never Used  . Alcohol use No    Review of Systems  UTO 2/2 dementia; nursing as per HPI    Vitals:   07/22/16 1418  BP: (!) 149/73  Pulse: 88  Resp: 18  Temp: 97 F (36.1 C)   Body mass index is 16.72 kg/m. Physical Exam  GENERAL APPEARANCE: Alert, non conversant, No acute distress  SKIN: No diaphoresis rash HEENT: lips are normal, tongue without swelling, throat without swelling RESPIRATORY: Breathing is even, unlabored. Lung sounds are clear-no wheezing   CARDIOVASCULAR: Heart RRR no murmurs, rubs or gallops. No peripheral edema  GASTROINTESTINAL: Abdomen is soft, non-tender, not distended w/ normal bowel sounds.  GENITOURINARY: Bladder non tender, not distended  MUSCULOSKELETAL: wasting and contracture upper and lower NEUROLOGIC: Cranial nerves 2-12 grossly intact; functional quadiplegia PSYCHIATRIC: sweet, dementia  Patient Active Problem List   Diagnosis Date Noted  . Bunion   . Corns and callosities   . Skin ulcers of both feet ([place]) 10/23/2015  . Vitamin B12 deficiency 10/23/2015  . FTT (failure to thrive) in adult 09/01/2015  . Lewy body dementia 09/01/2015  . Corn or callus 02/06/2015  . Dizziness 02/06/2015  . Neuropathy 02/06/2015  . Secondary parkinsonism ([place])   . Depression   . Anxiety   . Essential hypertension   . HLD (hyperlipidemia)   . Nonorganic psychosis   . Atrial fibrillation ([place]) 01/12/2015  . Sepsis ([place]) 01/12/2015  . Ischemic stroke ([place]) 01/12/2015  . Acute encephalopathy   . Paroxysmal atrial fibrillation (HCC)   . Schizophrenia ([place])   . Atrial fibrillation with RVR ([place]) 01/11/2015  . SVT (supraventricular tachycardia)  ([place]) 01/11/2015  . Alzheimer disease   . Diabetes mellitus without complication ([place])   . HOCM (hypertrophic obstructive cardiomyopathy) ([place])   . HTN (hypertension) 01/07/2015  . Edema 01/06/2015  . Blister of skin without infection 01/05/2015  . Parkinsonian features 12/25/2014  . Weakness 12/18/2014  . Altered mental status 12/11/2014  . UTI (urinary tract infection) 12/02/2014  . GERD (gastroesophageal reflux disease) 12/02/2014  . Psychosis 12/02/2014  . Renal failure (ARF), acute on chronic (HCC) 11/24/2014  . MDD (major depressive disorder) 11/24/2014  . Pressure ulcer 11/24/2014  . Elevated troponin 11/23/2014  . Difficulty hearing 11/07/2014  . Alzheimer's dementia with behavioral disturbance 11/03/2014  . Severe episode of recurrent major depressive disorder, without psychotic features ([place]) 11/02/2014  . Chronic kidney disease (CKD), stage III (  moderate) 09/17/2014  . Rotator cuff arthropathy 07/21/2014  . Diabetic polyneuropathy ([place]) 05/21/2014  . Acid reflux 05/21/2014  . Aortic heart valve narrowing 02/24/2014  . Acquired deformities of toe 07/12/2013  . Hypertrophic obstructive cardiomyopathy ([place]) 07/04/2013  . Left ventricular outflow obstruction 05/31/2012  . Dynamic left ventricular outflow obstruction 05/31/2012  . Left ventricular hypertrophy 05/07/2011  . Sick sinus syndrome ([place]) 05/07/2011  . Cardiac conduction disorder 05/01/2011  . Diabetes mellitus ([place]) 05/01/2011  . Degeneration of intervertebral disc of lumbosacral region 01/27/2011  . Chronic kidney disease 12/16/2010  . Absence of bladder continence 12/16/2010  . Diverticulitis 06/03/2010  . Fibromyalgia 06/03/2010  . Arthritis, degenerative 06/03/2010  . Adenomatous colon polyp 08/03/2006    CMP     Component Value Date/Time   NA 137 04/11/2016   K 4.5 04/11/2016   CL 113 (H) 01/15/2015 0518   CO2 18 (L) 01/15/2015 0518   GLUCOSE 139 (H) 01/15/2015 0518   BUN 14 04/11/2016    CREATININE 0.5 04/11/2016   CREATININE 0.69 01/15/2015 0518   CALCIUM 8.9 01/15/2015 0518   PROT 4.9 (L) 01/14/2015 0154   ALBUMIN 2.0 (L) 01/14/2015 0154   AST 25 01/14/2015 0154   ALT 31 01/14/2015 0154   ALKPHOS 93 01/14/2015 0154   BILITOT 1.1 01/14/2015 0154   GFRNONAA >60 01/15/2015 0518   GFRAA >60 01/15/2015 0518    Recent Labs  04/11/16  NA 137  K 4.5  BUN 14  CREATININE 0.5   No results for input(s): AST, ALT, ALKPHOS, BILITOT, PROT, ALBUMIN in the last 8760 hours. No results for input(s): WBC, NEUTROABS, HGB, HCT, MCV, PLT in the last 8760 hours. No results for input(s): CHOL, LDLCALC, TRIG in the last 8760 hours.  Invalid input(s): HCL No results found for: [facility] Lab Results  Component Value Date   TSH 3.421 01/12/2015   Lab Results  Component Value Date   HGBA1C 6.1 (H) 01/12/2015   Lab Results  Component Value Date   CHOL 217 (H) 01/12/2015   HDL 20 (L) 01/12/2015   LDLCALC 156 (H) 01/12/2015   TRIG 207 (H) 01/12/2015   CHOLHDL 10.9 01/12/2015    Significant Diagnostic Results in last 30 days:  No results found.  Assessment and Plan  ANGIOEDEMA - was reported by a reliable nurse, don't see any now butI have to believe it so..have ordered benadryl 25 mg po q6 scheduled for 2 days   Noah Delaine. Sheppard Coil, MD

## 2016-07-23 DIAGNOSIS — N189 Chronic kidney disease, unspecified: Secondary | ICD-10-CM | POA: Diagnosis not present

## 2016-07-23 DIAGNOSIS — I429 Cardiomyopathy, unspecified: Secondary | ICD-10-CM | POA: Diagnosis not present

## 2016-07-23 DIAGNOSIS — I679 Cerebrovascular disease, unspecified: Secondary | ICD-10-CM | POA: Diagnosis not present

## 2016-07-23 DIAGNOSIS — G3183 Dementia with Lewy bodies: Secondary | ICD-10-CM | POA: Diagnosis not present

## 2016-07-23 DIAGNOSIS — I4891 Unspecified atrial fibrillation: Secondary | ICD-10-CM | POA: Diagnosis not present

## 2016-07-23 DIAGNOSIS — I359 Nonrheumatic aortic valve disorder, unspecified: Secondary | ICD-10-CM | POA: Diagnosis not present

## 2016-07-25 DIAGNOSIS — N189 Chronic kidney disease, unspecified: Secondary | ICD-10-CM | POA: Diagnosis not present

## 2016-07-25 DIAGNOSIS — I4891 Unspecified atrial fibrillation: Secondary | ICD-10-CM | POA: Diagnosis not present

## 2016-07-25 DIAGNOSIS — I359 Nonrheumatic aortic valve disorder, unspecified: Secondary | ICD-10-CM | POA: Diagnosis not present

## 2016-07-25 DIAGNOSIS — I429 Cardiomyopathy, unspecified: Secondary | ICD-10-CM | POA: Diagnosis not present

## 2016-07-25 DIAGNOSIS — G3183 Dementia with Lewy bodies: Secondary | ICD-10-CM | POA: Diagnosis not present

## 2016-07-25 DIAGNOSIS — I679 Cerebrovascular disease, unspecified: Secondary | ICD-10-CM | POA: Diagnosis not present

## 2016-07-26 ENCOUNTER — Encounter: Payer: Self-pay | Admitting: Internal Medicine

## 2016-07-26 NOTE — Assessment & Plan Note (Signed)
Pt on no meds ; BS run in low 100's; pt is Hospice so blood draws kept to minimum

## 2016-07-26 NOTE — Assessment & Plan Note (Signed)
No reported problems;plan to cont omeprazole 20 mg daily

## 2016-07-26 NOTE — Assessment & Plan Note (Signed)
Stable; plan to cont metoprolol 75 mg BID and prophylaxis with ASA 324 mg daily

## 2016-07-28 DIAGNOSIS — I429 Cardiomyopathy, unspecified: Secondary | ICD-10-CM | POA: Diagnosis not present

## 2016-07-28 DIAGNOSIS — N189 Chronic kidney disease, unspecified: Secondary | ICD-10-CM | POA: Diagnosis not present

## 2016-07-28 DIAGNOSIS — I359 Nonrheumatic aortic valve disorder, unspecified: Secondary | ICD-10-CM | POA: Diagnosis not present

## 2016-07-28 DIAGNOSIS — I679 Cerebrovascular disease, unspecified: Secondary | ICD-10-CM | POA: Diagnosis not present

## 2016-07-28 DIAGNOSIS — I4891 Unspecified atrial fibrillation: Secondary | ICD-10-CM | POA: Diagnosis not present

## 2016-07-28 DIAGNOSIS — G3183 Dementia with Lewy bodies: Secondary | ICD-10-CM | POA: Diagnosis not present

## 2016-07-29 DIAGNOSIS — I679 Cerebrovascular disease, unspecified: Secondary | ICD-10-CM | POA: Diagnosis not present

## 2016-07-29 DIAGNOSIS — G3183 Dementia with Lewy bodies: Secondary | ICD-10-CM | POA: Diagnosis not present

## 2016-07-29 DIAGNOSIS — I359 Nonrheumatic aortic valve disorder, unspecified: Secondary | ICD-10-CM | POA: Diagnosis not present

## 2016-07-29 DIAGNOSIS — I4891 Unspecified atrial fibrillation: Secondary | ICD-10-CM | POA: Diagnosis not present

## 2016-07-29 DIAGNOSIS — I429 Cardiomyopathy, unspecified: Secondary | ICD-10-CM | POA: Diagnosis not present

## 2016-07-29 DIAGNOSIS — N189 Chronic kidney disease, unspecified: Secondary | ICD-10-CM | POA: Diagnosis not present

## 2016-07-30 DIAGNOSIS — I679 Cerebrovascular disease, unspecified: Secondary | ICD-10-CM | POA: Diagnosis not present

## 2016-07-30 DIAGNOSIS — I359 Nonrheumatic aortic valve disorder, unspecified: Secondary | ICD-10-CM | POA: Diagnosis not present

## 2016-07-30 DIAGNOSIS — I429 Cardiomyopathy, unspecified: Secondary | ICD-10-CM | POA: Diagnosis not present

## 2016-07-30 DIAGNOSIS — G3183 Dementia with Lewy bodies: Secondary | ICD-10-CM | POA: Diagnosis not present

## 2016-07-30 DIAGNOSIS — I4891 Unspecified atrial fibrillation: Secondary | ICD-10-CM | POA: Diagnosis not present

## 2016-07-30 DIAGNOSIS — N189 Chronic kidney disease, unspecified: Secondary | ICD-10-CM | POA: Diagnosis not present

## 2016-07-31 DIAGNOSIS — I359 Nonrheumatic aortic valve disorder, unspecified: Secondary | ICD-10-CM | POA: Diagnosis not present

## 2016-07-31 DIAGNOSIS — G3183 Dementia with Lewy bodies: Secondary | ICD-10-CM | POA: Diagnosis not present

## 2016-07-31 DIAGNOSIS — I4891 Unspecified atrial fibrillation: Secondary | ICD-10-CM | POA: Diagnosis not present

## 2016-07-31 DIAGNOSIS — I679 Cerebrovascular disease, unspecified: Secondary | ICD-10-CM | POA: Diagnosis not present

## 2016-07-31 DIAGNOSIS — N189 Chronic kidney disease, unspecified: Secondary | ICD-10-CM | POA: Diagnosis not present

## 2016-07-31 DIAGNOSIS — I429 Cardiomyopathy, unspecified: Secondary | ICD-10-CM | POA: Diagnosis not present

## 2016-08-01 DIAGNOSIS — I679 Cerebrovascular disease, unspecified: Secondary | ICD-10-CM | POA: Diagnosis not present

## 2016-08-01 DIAGNOSIS — G3183 Dementia with Lewy bodies: Secondary | ICD-10-CM | POA: Diagnosis not present

## 2016-08-01 DIAGNOSIS — I429 Cardiomyopathy, unspecified: Secondary | ICD-10-CM | POA: Diagnosis not present

## 2016-08-01 DIAGNOSIS — I359 Nonrheumatic aortic valve disorder, unspecified: Secondary | ICD-10-CM | POA: Diagnosis not present

## 2016-08-01 DIAGNOSIS — N189 Chronic kidney disease, unspecified: Secondary | ICD-10-CM | POA: Diagnosis not present

## 2016-08-01 DIAGNOSIS — I4891 Unspecified atrial fibrillation: Secondary | ICD-10-CM | POA: Diagnosis not present

## 2016-08-04 ENCOUNTER — Encounter: Payer: Self-pay | Admitting: Internal Medicine

## 2016-08-04 NOTE — Assessment & Plan Note (Signed)
Slow and steady decline; not on any dementia specific meds; cont for behavoirs depakote 250 mg qAM and 375 mg qHS

## 2016-08-04 NOTE — Assessment & Plan Note (Signed)
Stable, pt appears content;cont lexapro 10 mg daily

## 2016-08-04 NOTE — Assessment & Plan Note (Signed)
Chronic and stable; cont metoprolol 75 mg BID and prophylaxis with ASA 81 mg daily

## 2016-08-06 DIAGNOSIS — G3183 Dementia with Lewy bodies: Secondary | ICD-10-CM | POA: Diagnosis not present

## 2016-08-06 DIAGNOSIS — N189 Chronic kidney disease, unspecified: Secondary | ICD-10-CM | POA: Diagnosis not present

## 2016-08-06 DIAGNOSIS — I359 Nonrheumatic aortic valve disorder, unspecified: Secondary | ICD-10-CM | POA: Diagnosis not present

## 2016-08-06 DIAGNOSIS — I679 Cerebrovascular disease, unspecified: Secondary | ICD-10-CM | POA: Diagnosis not present

## 2016-08-06 DIAGNOSIS — I4891 Unspecified atrial fibrillation: Secondary | ICD-10-CM | POA: Diagnosis not present

## 2016-08-06 DIAGNOSIS — I429 Cardiomyopathy, unspecified: Secondary | ICD-10-CM | POA: Diagnosis not present

## 2016-08-07 DIAGNOSIS — N189 Chronic kidney disease, unspecified: Secondary | ICD-10-CM | POA: Diagnosis not present

## 2016-08-07 DIAGNOSIS — I679 Cerebrovascular disease, unspecified: Secondary | ICD-10-CM | POA: Diagnosis not present

## 2016-08-07 DIAGNOSIS — I429 Cardiomyopathy, unspecified: Secondary | ICD-10-CM | POA: Diagnosis not present

## 2016-08-07 DIAGNOSIS — I4891 Unspecified atrial fibrillation: Secondary | ICD-10-CM | POA: Diagnosis not present

## 2016-08-07 DIAGNOSIS — G3183 Dementia with Lewy bodies: Secondary | ICD-10-CM | POA: Diagnosis not present

## 2016-08-07 DIAGNOSIS — I359 Nonrheumatic aortic valve disorder, unspecified: Secondary | ICD-10-CM | POA: Diagnosis not present

## 2016-08-08 DIAGNOSIS — I679 Cerebrovascular disease, unspecified: Secondary | ICD-10-CM | POA: Diagnosis not present

## 2016-08-08 DIAGNOSIS — G9009 Other idiopathic peripheral autonomic neuropathy: Secondary | ICD-10-CM | POA: Diagnosis not present

## 2016-08-08 DIAGNOSIS — N189 Chronic kidney disease, unspecified: Secondary | ICD-10-CM | POA: Diagnosis not present

## 2016-08-08 DIAGNOSIS — G3183 Dementia with Lewy bodies: Secondary | ICD-10-CM | POA: Diagnosis not present

## 2016-08-08 DIAGNOSIS — I359 Nonrheumatic aortic valve disorder, unspecified: Secondary | ICD-10-CM | POA: Diagnosis not present

## 2016-08-08 DIAGNOSIS — M2450 Contracture, unspecified joint: Secondary | ICD-10-CM | POA: Diagnosis not present

## 2016-08-08 DIAGNOSIS — R630 Anorexia: Secondary | ICD-10-CM | POA: Diagnosis not present

## 2016-08-08 DIAGNOSIS — L8990 Pressure ulcer of unspecified site, unspecified stage: Secondary | ICD-10-CM | POA: Diagnosis not present

## 2016-08-08 DIAGNOSIS — R1310 Dysphagia, unspecified: Secondary | ICD-10-CM | POA: Diagnosis not present

## 2016-08-08 DIAGNOSIS — J309 Allergic rhinitis, unspecified: Secondary | ICD-10-CM | POA: Diagnosis not present

## 2016-08-08 DIAGNOSIS — E1159 Type 2 diabetes mellitus with other circulatory complications: Secondary | ICD-10-CM | POA: Diagnosis not present

## 2016-08-08 DIAGNOSIS — E785 Hyperlipidemia, unspecified: Secondary | ICD-10-CM | POA: Diagnosis not present

## 2016-08-08 DIAGNOSIS — I10 Essential (primary) hypertension: Secondary | ICD-10-CM | POA: Diagnosis not present

## 2016-08-08 DIAGNOSIS — R634 Abnormal weight loss: Secondary | ICD-10-CM | POA: Diagnosis not present

## 2016-08-08 DIAGNOSIS — I429 Cardiomyopathy, unspecified: Secondary | ICD-10-CM | POA: Diagnosis not present

## 2016-08-08 DIAGNOSIS — I4891 Unspecified atrial fibrillation: Secondary | ICD-10-CM | POA: Diagnosis not present

## 2016-08-08 DIAGNOSIS — F319 Bipolar disorder, unspecified: Secondary | ICD-10-CM | POA: Diagnosis not present

## 2016-08-08 DIAGNOSIS — K219 Gastro-esophageal reflux disease without esophagitis: Secondary | ICD-10-CM | POA: Diagnosis not present

## 2016-08-09 DIAGNOSIS — I429 Cardiomyopathy, unspecified: Secondary | ICD-10-CM | POA: Diagnosis not present

## 2016-08-09 DIAGNOSIS — I359 Nonrheumatic aortic valve disorder, unspecified: Secondary | ICD-10-CM | POA: Diagnosis not present

## 2016-08-09 DIAGNOSIS — G3183 Dementia with Lewy bodies: Secondary | ICD-10-CM | POA: Diagnosis not present

## 2016-08-09 DIAGNOSIS — I4891 Unspecified atrial fibrillation: Secondary | ICD-10-CM | POA: Diagnosis not present

## 2016-08-09 DIAGNOSIS — I679 Cerebrovascular disease, unspecified: Secondary | ICD-10-CM | POA: Diagnosis not present

## 2016-08-09 DIAGNOSIS — N189 Chronic kidney disease, unspecified: Secondary | ICD-10-CM | POA: Diagnosis not present

## 2016-08-11 DIAGNOSIS — G3183 Dementia with Lewy bodies: Secondary | ICD-10-CM | POA: Diagnosis not present

## 2016-08-11 DIAGNOSIS — I4891 Unspecified atrial fibrillation: Secondary | ICD-10-CM | POA: Diagnosis not present

## 2016-08-11 DIAGNOSIS — I679 Cerebrovascular disease, unspecified: Secondary | ICD-10-CM | POA: Diagnosis not present

## 2016-08-11 DIAGNOSIS — L89220 Pressure ulcer of left hip, unstageable: Secondary | ICD-10-CM | POA: Diagnosis not present

## 2016-08-11 DIAGNOSIS — L89104 Pressure ulcer of unspecified part of back, stage 4: Secondary | ICD-10-CM | POA: Diagnosis not present

## 2016-08-11 DIAGNOSIS — I429 Cardiomyopathy, unspecified: Secondary | ICD-10-CM | POA: Diagnosis not present

## 2016-08-11 DIAGNOSIS — N189 Chronic kidney disease, unspecified: Secondary | ICD-10-CM | POA: Diagnosis not present

## 2016-08-11 DIAGNOSIS — L89150 Pressure ulcer of sacral region, unstageable: Secondary | ICD-10-CM | POA: Diagnosis not present

## 2016-08-11 DIAGNOSIS — I359 Nonrheumatic aortic valve disorder, unspecified: Secondary | ICD-10-CM | POA: Diagnosis not present

## 2016-08-12 ENCOUNTER — Non-Acute Institutional Stay (SKILLED_NURSING_FACILITY): Payer: Medicare Other | Admitting: Internal Medicine

## 2016-08-12 ENCOUNTER — Encounter: Payer: Self-pay | Admitting: Internal Medicine

## 2016-08-12 DIAGNOSIS — I429 Cardiomyopathy, unspecified: Secondary | ICD-10-CM | POA: Diagnosis not present

## 2016-08-12 DIAGNOSIS — I10 Essential (primary) hypertension: Secondary | ICD-10-CM | POA: Diagnosis not present

## 2016-08-12 DIAGNOSIS — I679 Cerebrovascular disease, unspecified: Secondary | ICD-10-CM | POA: Diagnosis not present

## 2016-08-12 DIAGNOSIS — F209 Schizophrenia, unspecified: Secondary | ICD-10-CM

## 2016-08-12 DIAGNOSIS — G3183 Dementia with Lewy bodies: Secondary | ICD-10-CM | POA: Diagnosis not present

## 2016-08-12 DIAGNOSIS — I359 Nonrheumatic aortic valve disorder, unspecified: Secondary | ICD-10-CM | POA: Diagnosis not present

## 2016-08-12 DIAGNOSIS — N189 Chronic kidney disease, unspecified: Secondary | ICD-10-CM | POA: Diagnosis not present

## 2016-08-12 DIAGNOSIS — I421 Obstructive hypertrophic cardiomyopathy: Secondary | ICD-10-CM | POA: Diagnosis not present

## 2016-08-12 DIAGNOSIS — I4891 Unspecified atrial fibrillation: Secondary | ICD-10-CM | POA: Diagnosis not present

## 2016-08-12 NOTE — Progress Notes (Signed)
Location:  [facility] Room Number: 202D Place of Service:  SNF (31)  Hennie Duos, MD  Patient Care Team: Hennie Duos, MD as PCP - General (Internal Medicine)  Extended Emergency Contact Information Primary Emergency Contact: Kozicki,James Address: [address] [place], [place] [address] [place] Phone: [phone] Relation: Son Secondary Emergency Contact: Rudell Cobb [place] of [place] Phone: [phone] Relation: Other    Allergies: Cymbalta [duloxetine hcl]; Lipitor [atorvastatin]; Metformin and related; Simvastatin; and Verapamil  Chief Complaint  Patient presents with  . Medical Management of Chronic Issues    Routine Visit    HPI: Patient is 83 y.o. female who Is being seen for routine issues of hypertropic obstructive myopathy, hypertension, and schizophrenia.  Past Medical History:  Diagnosis Date  . Altered mental status 12/11/2014  . Alzheimer disease   . Atrial fibrillation ([place])   . Bunion   . Chronic kidney disease   . Colon polyp   . Corns and callosities   . CRI (chronic renal insufficiency) 12/16/2010   Nephrology, Laurance Flatten, MD  . Diabetes mellitus without complication ([place]) 69/48/5462  . Dizzy   . Dynamic left ventricular outflow obstruction 05/31/2012   Resting gradient 52 mm hg  . Fibromyalgia   . GERD (gastroesophageal reflux disease)   . HOCM (hypertrophic obstructive cardiomyopathy) ([place])   . Hypertension   . Neuropathy   . Pressure ulcer 11/24/2014  . Renal failure (ARF), acute on chronic ([place]) 11/24/2014  . Sick sinus syndrome ([place]) 05/07/2011    Past Surgical History:  Procedure Laterality Date  . COLONOSCOPY    . EYE SURGERY     cataract  . HEMORRHOID SURGERY    . SHOULDER SURGERY Left    rotator cuff  . UPPER GASTROINTESTINAL ENDOSCOPY      Allergies as of 08/12/2016      Reactions   Cymbalta [duloxetine Hcl] Other (See Comments)    Diarrhea Listed on MAR   Lipitor [atorvastatin] Other (See Comments)   myalgias Listed on MAR   Metformin And Related Other (See Comments)   Listed on MAR   Simvastatin Other (See Comments)   Listed on MAR   Verapamil    Sinus arrest      Medication List       Accurate as of 08/12/16 11:59 PM. Always use your most recent med list.          amLODipine 5 MG tablet Commonly known as:  NORVASC Take 5 mg by mouth daily.   aspirin 81 MG chewable tablet Chew 324 mg by mouth daily. 4 tablets   divalproex 125 MG capsule Commonly known as:  DEPAKOTE SPRINKLE Take 2 capsules (250mg ) by mouth each morning and take 3 capsules (375 mg) by mouth every evening.   docusate sodium 100 MG capsule Commonly known as:  COLACE Take 1 capsule (100 mg total) by mouth daily.   escitalopram 5 MG tablet Commonly known as:  LEXAPRO Take 10 mg by mouth daily. 2 tablets   feeding supplement (ENSURE ENLIVE) Liqd Take 237 mLs by mouth 4 (four) times daily -  before meals and at bedtime.   feeding supplement (PRO-STAT SUGAR FREE 64) Liqd Take 30 mLs by mouth 2 (two) times daily.   fluticasone 50 MCG/ACT nasal spray Commonly known as:  FLONASE Place 2 sprays into both nostrils daily.   loratadine 10 MG tablet Commonly known as:  CLARITIN Take 10 mg by mouth daily as needed for allergies.   metoprolol tartrate 25 mg/10 mL Susp Commonly known as:  LOPRESSOR Give 15 mls (75 mg) by mouth twice daily   morphine 20 MG/5ML solution Take 5 mg by mouth every 3 (three) hours. Dosage to be checked by two nurses prior to administration   omeprazole 20 MG capsule Commonly known as:  PRILOSEC Take 20 mg by mouth daily.   THERA VITAMIN PO Take 1 tablet by mouth daily.   UNABLE TO FIND Med Name: Med Pass initiate 4 oz  three times daily       Meds ordered this encounter  Medications  . DISCONTD: morphine 20 MG/5ML solution    Sig: Take 5 mg by mouth every 3 (three) hours. Dosage to be checked  by two nurses prior to administration    Immunization History  Administered Date(s) Administered  . Influenza Inj Mdck Quad Pf 12/22/2013  . Influenza, Seasonal, Injecte, Preservative Fre 12/02/2010, 12/22/2013  . Influenza,trivalent, recombinat, inj, PF 12/02/2010  . Influenza-Unspecified 12/06/2015  . PPD Test 11/07/2014, 06/08/2015, 06/25/2015  . Pneumococcal Conjugate-13 03/10/1998  . Pneumococcal Polysaccharide-23 10/23/2010  . Zoster 05/05/2007    Social History  Substance Use Topics  . Smoking status: Never Smoker  . Smokeless tobacco: Never Used  . Alcohol use No    Review of Systems  uto 2/2 DEMENTIA;nursing-eating less    Vitals:   08/12/16 1120  BP: (!) 149/73  Pulse: 88  Resp: 18  Temp: 97 F (36.1 C)   Body mass index is 16.72 kg/m. Physical Exam  GENERAL APPEARANCE: Alert,  No acute distress  SKIN: No diaphoresis rash HEENT: Unremarkable RESPIRATORY: Breathing is even, unlabored. Lung sounds are clear   CARDIOVASCULAR: Heart RRR no murmurs, rubs or gallops. No peripheral edema  GASTROINTESTINAL: Abdomen is soft, non-tender, not distended w/ normal bowel sounds.  GENITOURINARY: Bladder non tender, not distended  MUSCULOSKELETAL: No abnormal joints or musculature NEUROLOGIC: Cranial nerves 2-12 grossly intact;Quadriplegia PSYCHIATRIC: Dementia, no behavioral issues  Patient Active Problem List   Diagnosis Date Noted  . Bunion   . Corns and callosities   . Skin ulcers of both feet ([place]) 10/23/2015  . Vitamin B12 deficiency 10/23/2015  . FTT (failure to thrive) in adult 09/01/2015  . Lewy body dementia 09/01/2015  . Corn or callus 02/06/2015  . Dizziness 02/06/2015  . Neuropathy 02/06/2015  . Secondary parkinsonism ([place])   . Depression   . Anxiety   . Essential hypertension   . HLD (hyperlipidemia)   . Nonorganic psychosis   . Atrial fibrillation ([place]) 01/12/2015  . Sepsis ([place]) 01/12/2015  . Ischemic stroke ([place]) 01/12/2015  . Acute  encephalopathy   . Paroxysmal atrial fibrillation (H[facility])   . Schizophrenia ([place])   . Atrial fibrillation with RVR ([place]) 01/11/2015  . SVT (supraventricular tachycardia) ([place]) 01/11/2015  . Alzheimer disease   . Diabetes mellitus without complication ([place])   . HOCM (hypertrophic obstructive cardiomyopathy) ([place])   . HTN (hypertension) 01/07/2015  . Edema 01/06/2015  . Blister of skin without infection 01/05/2015  . Parkinsonian features 12/25/2014  . Weakness 12/18/2014  . Altered mental status 12/11/2014  . UTI (urinary tract infection) 12/02/2014  . GERD (gastroesophageal reflux disease) 12/02/2014  . Psychosis 12/02/2014  . Renal failure (ARF), acute on chronic (HCC) 11/24/2014  . MDD (major depressive disorder) 11/24/2014  . Pressure ulcer 11/24/2014  . Elevated troponin 11/23/2014  . Difficulty hearing 11/07/2014  . Alzheimer's dementia with behavioral  disturbance 11/03/2014  . Severe episode of recurrent major depressive disorder, without psychotic features ([place]) 11/02/2014  . Chronic kidney disease (CKD), stage III (moderate) 09/17/2014  . Rotator cuff arthropathy 07/21/2014  . Diabetic polyneuropathy ([place]) 05/21/2014  . Acid reflux 05/21/2014  . Aortic heart valve narrowing 02/24/2014  . Acquired deformities of toe 07/12/2013  . Hypertrophic obstructive cardiomyopathy ([place]) 07/04/2013  . Left ventricular outflow obstruction 05/31/2012  . Dynamic left ventricular outflow obstruction 05/31/2012  . Left ventricular hypertrophy 05/07/2011  . Sick sinus syndrome ([place]) 05/07/2011  . Cardiac conduction disorder 05/01/2011  . Diabetes mellitus ([place]) 05/01/2011  . Degeneration of intervertebral disc of lumbosacral region 01/27/2011  . Chronic kidney disease 12/16/2010  . Absence of bladder continence 12/16/2010  . Diverticulitis 06/03/2010  . Fibromyalgia 06/03/2010  . Arthritis, degenerative 06/03/2010  . Adenomatous colon polyp 08/03/2006    CMP     Component Value  Date/Time   NA 137 04/11/2016   K 4.5 04/11/2016   CL 113 (H) 01/15/2015 0518   CO2 18 (L) 01/15/2015 0518   GLUCOSE 139 (H) 01/15/2015 0518   BUN 14 04/11/2016   CREATININE 0.5 04/11/2016   CREATININE 0.69 01/15/2015 0518   CALCIUM 8.9 01/15/2015 0518   PROT 4.9 (L) 01/14/2015 0154   ALBUMIN 2.0 (L) 01/14/2015 0154   AST 25 01/14/2015 0154   ALT 31 01/14/2015 0154   ALKPHOS 93 01/14/2015 0154   BILITOT 1.1 01/14/2015 0154   GFRNONAA >60 01/15/2015 0518   GFRAA >60 01/15/2015 0518    Recent Labs  04/11/16  NA 137  K 4.5  BUN 14  CREATININE 0.5   No results for input(s): AST, ALT, ALKPHOS, BILITOT, PROT, ALBUMIN in the last 8760 hours. No results for input(s): WBC, NEUTROABS, HGB, HCT, MCV, PLT in the last 8760 hours. No results for input(s): CHOL, LDLCALC, TRIG in the last 8760 hours.  Invalid input(s): HCL No results found for: [facility] Lab Results  Component Value Date   TSH 3.421 01/12/2015   Lab Results  Component Value Date   HGBA1C 6.1 (H) 01/12/2015   Lab Results  Component Value Date   CHOL 217 (H) 01/12/2015   HDL 20 (L) 01/12/2015   LDLCALC 156 (H) 01/12/2015   TRIG 207 (H) 01/12/2015   CHOLHDL 10.9 01/12/2015    Significant Diagnostic Results in last 30 days:  No results found.  Assessment and Plan  Hypertrophic obstructive cardiomyopathy (HCC) No exacerbation; continue with metoprolol 75 mg twice a day  HTN (hypertension) Controlled; continue Norvasc 5 mg by mouth daily and metoprolol 75 mg by mouth twice a day  Schizophrenia ([place]) Mood is very stable plan to continue Depakote 250 mg every morning and 375 mg by mouth daily at bedtime    Webb Silversmith D. Sheppard Coil, MD

## 2016-08-13 DIAGNOSIS — I429 Cardiomyopathy, unspecified: Secondary | ICD-10-CM | POA: Diagnosis not present

## 2016-08-13 DIAGNOSIS — I359 Nonrheumatic aortic valve disorder, unspecified: Secondary | ICD-10-CM | POA: Diagnosis not present

## 2016-08-13 DIAGNOSIS — G3183 Dementia with Lewy bodies: Secondary | ICD-10-CM | POA: Diagnosis not present

## 2016-08-13 DIAGNOSIS — N189 Chronic kidney disease, unspecified: Secondary | ICD-10-CM | POA: Diagnosis not present

## 2016-08-13 DIAGNOSIS — I679 Cerebrovascular disease, unspecified: Secondary | ICD-10-CM | POA: Diagnosis not present

## 2016-08-13 DIAGNOSIS — I4891 Unspecified atrial fibrillation: Secondary | ICD-10-CM | POA: Diagnosis not present

## 2016-08-14 DIAGNOSIS — I4891 Unspecified atrial fibrillation: Secondary | ICD-10-CM | POA: Diagnosis not present

## 2016-08-14 DIAGNOSIS — I359 Nonrheumatic aortic valve disorder, unspecified: Secondary | ICD-10-CM | POA: Diagnosis not present

## 2016-08-14 DIAGNOSIS — I429 Cardiomyopathy, unspecified: Secondary | ICD-10-CM | POA: Diagnosis not present

## 2016-08-14 DIAGNOSIS — N189 Chronic kidney disease, unspecified: Secondary | ICD-10-CM | POA: Diagnosis not present

## 2016-08-14 DIAGNOSIS — G3183 Dementia with Lewy bodies: Secondary | ICD-10-CM | POA: Diagnosis not present

## 2016-08-14 DIAGNOSIS — I679 Cerebrovascular disease, unspecified: Secondary | ICD-10-CM | POA: Diagnosis not present

## 2016-08-15 DIAGNOSIS — G3183 Dementia with Lewy bodies: Secondary | ICD-10-CM | POA: Diagnosis not present

## 2016-08-15 DIAGNOSIS — I429 Cardiomyopathy, unspecified: Secondary | ICD-10-CM | POA: Diagnosis not present

## 2016-08-15 DIAGNOSIS — I679 Cerebrovascular disease, unspecified: Secondary | ICD-10-CM | POA: Diagnosis not present

## 2016-08-15 DIAGNOSIS — N189 Chronic kidney disease, unspecified: Secondary | ICD-10-CM | POA: Diagnosis not present

## 2016-08-15 DIAGNOSIS — I359 Nonrheumatic aortic valve disorder, unspecified: Secondary | ICD-10-CM | POA: Diagnosis not present

## 2016-08-15 DIAGNOSIS — I4891 Unspecified atrial fibrillation: Secondary | ICD-10-CM | POA: Diagnosis not present

## 2016-08-18 ENCOUNTER — Encounter: Payer: Self-pay | Admitting: Internal Medicine

## 2016-08-18 DIAGNOSIS — L97511 Non-pressure chronic ulcer of other part of right foot limited to breakdown of skin: Secondary | ICD-10-CM | POA: Diagnosis not present

## 2016-08-18 DIAGNOSIS — L89104 Pressure ulcer of unspecified part of back, stage 4: Secondary | ICD-10-CM | POA: Diagnosis not present

## 2016-08-18 DIAGNOSIS — G3183 Dementia with Lewy bodies: Secondary | ICD-10-CM | POA: Diagnosis not present

## 2016-08-18 DIAGNOSIS — L89150 Pressure ulcer of sacral region, unstageable: Secondary | ICD-10-CM | POA: Diagnosis not present

## 2016-08-18 DIAGNOSIS — I429 Cardiomyopathy, unspecified: Secondary | ICD-10-CM | POA: Diagnosis not present

## 2016-08-18 DIAGNOSIS — I679 Cerebrovascular disease, unspecified: Secondary | ICD-10-CM | POA: Diagnosis not present

## 2016-08-18 DIAGNOSIS — N189 Chronic kidney disease, unspecified: Secondary | ICD-10-CM | POA: Diagnosis not present

## 2016-08-18 DIAGNOSIS — I359 Nonrheumatic aortic valve disorder, unspecified: Secondary | ICD-10-CM | POA: Diagnosis not present

## 2016-08-18 DIAGNOSIS — I4891 Unspecified atrial fibrillation: Secondary | ICD-10-CM | POA: Diagnosis not present

## 2016-08-18 DIAGNOSIS — L89220 Pressure ulcer of left hip, unstageable: Secondary | ICD-10-CM | POA: Diagnosis not present

## 2016-08-20 DIAGNOSIS — I359 Nonrheumatic aortic valve disorder, unspecified: Secondary | ICD-10-CM | POA: Diagnosis not present

## 2016-08-20 DIAGNOSIS — I679 Cerebrovascular disease, unspecified: Secondary | ICD-10-CM | POA: Diagnosis not present

## 2016-08-20 DIAGNOSIS — I4891 Unspecified atrial fibrillation: Secondary | ICD-10-CM | POA: Diagnosis not present

## 2016-08-20 DIAGNOSIS — G3183 Dementia with Lewy bodies: Secondary | ICD-10-CM | POA: Diagnosis not present

## 2016-08-20 DIAGNOSIS — I429 Cardiomyopathy, unspecified: Secondary | ICD-10-CM | POA: Diagnosis not present

## 2016-08-20 DIAGNOSIS — N189 Chronic kidney disease, unspecified: Secondary | ICD-10-CM | POA: Diagnosis not present

## 2016-08-21 DIAGNOSIS — I4891 Unspecified atrial fibrillation: Secondary | ICD-10-CM | POA: Diagnosis not present

## 2016-08-21 DIAGNOSIS — I679 Cerebrovascular disease, unspecified: Secondary | ICD-10-CM | POA: Diagnosis not present

## 2016-08-21 DIAGNOSIS — I359 Nonrheumatic aortic valve disorder, unspecified: Secondary | ICD-10-CM | POA: Diagnosis not present

## 2016-08-21 DIAGNOSIS — G3183 Dementia with Lewy bodies: Secondary | ICD-10-CM | POA: Diagnosis not present

## 2016-08-21 DIAGNOSIS — I429 Cardiomyopathy, unspecified: Secondary | ICD-10-CM | POA: Diagnosis not present

## 2016-08-21 DIAGNOSIS — N189 Chronic kidney disease, unspecified: Secondary | ICD-10-CM | POA: Diagnosis not present

## 2016-08-22 DIAGNOSIS — I359 Nonrheumatic aortic valve disorder, unspecified: Secondary | ICD-10-CM | POA: Diagnosis not present

## 2016-08-22 DIAGNOSIS — I4891 Unspecified atrial fibrillation: Secondary | ICD-10-CM | POA: Diagnosis not present

## 2016-08-22 DIAGNOSIS — G3183 Dementia with Lewy bodies: Secondary | ICD-10-CM | POA: Diagnosis not present

## 2016-08-22 DIAGNOSIS — I679 Cerebrovascular disease, unspecified: Secondary | ICD-10-CM | POA: Diagnosis not present

## 2016-08-22 DIAGNOSIS — I429 Cardiomyopathy, unspecified: Secondary | ICD-10-CM | POA: Diagnosis not present

## 2016-08-22 DIAGNOSIS — N189 Chronic kidney disease, unspecified: Secondary | ICD-10-CM | POA: Diagnosis not present

## 2016-08-23 DIAGNOSIS — N189 Chronic kidney disease, unspecified: Secondary | ICD-10-CM | POA: Diagnosis not present

## 2016-08-23 DIAGNOSIS — I429 Cardiomyopathy, unspecified: Secondary | ICD-10-CM | POA: Diagnosis not present

## 2016-08-23 DIAGNOSIS — G3183 Dementia with Lewy bodies: Secondary | ICD-10-CM | POA: Diagnosis not present

## 2016-08-23 DIAGNOSIS — I679 Cerebrovascular disease, unspecified: Secondary | ICD-10-CM | POA: Diagnosis not present

## 2016-08-23 DIAGNOSIS — I4891 Unspecified atrial fibrillation: Secondary | ICD-10-CM | POA: Diagnosis not present

## 2016-08-23 DIAGNOSIS — I359 Nonrheumatic aortic valve disorder, unspecified: Secondary | ICD-10-CM | POA: Diagnosis not present

## 2016-08-25 DIAGNOSIS — I679 Cerebrovascular disease, unspecified: Secondary | ICD-10-CM | POA: Diagnosis not present

## 2016-08-25 DIAGNOSIS — I359 Nonrheumatic aortic valve disorder, unspecified: Secondary | ICD-10-CM | POA: Diagnosis not present

## 2016-08-25 DIAGNOSIS — N189 Chronic kidney disease, unspecified: Secondary | ICD-10-CM | POA: Diagnosis not present

## 2016-08-25 DIAGNOSIS — G3183 Dementia with Lewy bodies: Secondary | ICD-10-CM | POA: Diagnosis not present

## 2016-08-25 DIAGNOSIS — I4891 Unspecified atrial fibrillation: Secondary | ICD-10-CM | POA: Diagnosis not present

## 2016-08-25 DIAGNOSIS — I429 Cardiomyopathy, unspecified: Secondary | ICD-10-CM | POA: Diagnosis not present

## 2016-08-27 ENCOUNTER — Non-Acute Institutional Stay (SKILLED_NURSING_FACILITY): Payer: Medicare Other

## 2016-08-27 DIAGNOSIS — I4891 Unspecified atrial fibrillation: Secondary | ICD-10-CM | POA: Diagnosis not present

## 2016-08-27 DIAGNOSIS — N189 Chronic kidney disease, unspecified: Secondary | ICD-10-CM | POA: Diagnosis not present

## 2016-08-27 DIAGNOSIS — G3183 Dementia with Lewy bodies: Secondary | ICD-10-CM | POA: Diagnosis not present

## 2016-08-27 DIAGNOSIS — Z0000 Encounter for general adult medical examination without abnormal findings: Secondary | ICD-10-CM | POA: Diagnosis not present

## 2016-08-27 DIAGNOSIS — I429 Cardiomyopathy, unspecified: Secondary | ICD-10-CM | POA: Diagnosis not present

## 2016-08-27 DIAGNOSIS — I359 Nonrheumatic aortic valve disorder, unspecified: Secondary | ICD-10-CM | POA: Diagnosis not present

## 2016-08-27 DIAGNOSIS — I679 Cerebrovascular disease, unspecified: Secondary | ICD-10-CM | POA: Diagnosis not present

## 2016-08-27 NOTE — Progress Notes (Signed)
Subjective:   Emma Mcguire is a 83 y.o. female who presents for an Initial Medicare Annual Wellness Visit at [facility]; incapacitated and unable to answer questions appropriately, hospice pt.    Objective:    Today's Vitals   08/27/16 1242  BP: 140/70  Pulse: 90  Temp: 100.3 F (37.9 C)  TempSrc: Axillary  SpO2: 92%  Weight: 113 lb (51.3 kg)  Height: 5\' 9"  (1.753 m)   Body mass index is 16.69 kg/m.   Current Medications (verified) Outpatient Encounter Prescriptions as of 08/27/2016  Medication Sig  . Amino Acids-Protein Hydrolys (FEEDING SUPPLEMENT, PRO-STAT SUGAR FREE 64,) LIQD Take 30 mLs by mouth 2 (two) times daily.   Marland Kitchen amLODipine (NORVASC) 5 MG tablet Take 5 mg by mouth daily.  Marland Kitchen aspirin 81 MG chewable tablet Chew 324 mg by mouth daily. 4 tablets  . divalproex (DEPAKOTE SPRINKLE) 125 MG capsule Take 2 capsules (250mg ) by mouth each morning and take 3 capsules (375 mg) by mouth every evening.  . docusate sodium (COLACE) 100 MG capsule Take 1 capsule (100 mg total) by mouth daily.  Marland Kitchen escitalopram (LEXAPRO) 5 MG tablet Take 10 mg by mouth daily. 2 tablets  . feeding supplement, ENSURE ENLIVE, (ENSURE ENLIVE) LIQD Take 237 mLs by mouth 4 (four) times daily -  before meals and at bedtime.   . fluticasone (FLONASE) 50 MCG/ACT nasal spray Place 2 sprays into both nostrils daily.   Marland Kitchen loratadine (CLARITIN) 10 MG tablet Take 10 mg by mouth daily as needed for allergies.  . metoprolol tartrate (LOPRESSOR) 25 mg/10 mL SUSP Give 15 mls (75 mg) by mouth twice daily  . morphine 20 MG/5ML solution Take 5 mg by mouth every 3 (three) hours. Dosage to be checked by two nurses prior to administration  . morphine 20 MG/5ML solution Take 20 mg by mouth every 2 (two) hours as needed for pain.  . Multiple Vitamin (THERA VITAMIN PO) Take 1 tablet by mouth daily.  Marland Kitchen omeprazole (PRILOSEC) 20 MG capsule Take 20 mg by mouth daily.  Marland Kitchen UNABLE TO FIND Med Name: Med Pass initiate 4 oz   three times daily   No facility-administered encounter medications on file as of 08/27/2016.     Allergies (verified) Cymbalta [duloxetine hcl]; Lipitor [atorvastatin]; Metformin and related; Simvastatin; and Verapamil   History: Past Medical History:  Diagnosis Date  . Alzheimer disease   . Atrial fibrillation ([place])   . Bunion   . Chronic kidney disease   . Colon polyp   . Corns and callosities   . CRI (chronic renal insufficiency) 12/16/2010   Nephrology, Laurance Flatten, MD  . Diabetes mellitus without complication ([place]) 99/35/7017  . Dizzy   . Dynamic left ventricular outflow obstruction 05/31/2012   Resting gradient 52 mm hg  . Fibromyalgia   . GERD (gastroesophageal reflux disease)   . HOCM (hypertrophic obstructive cardiomyopathy) ([place])   . Hypertension   . Neuropathy   . Sick sinus syndrome ([place]) 05/07/2011   Past Surgical History:  Procedure Laterality Date  . COLONOSCOPY    . EYE SURGERY     cataract  . HEMORRHOID SURGERY    . SHOULDER SURGERY Left    rotator cuff  . UPPER GASTROINTESTINAL ENDOSCOPY     Family History  Problem Relation Age of Onset  . Stroke Mother   . Heart disease Mother   . Heart disease Father   . Diabetes Mellitus II Maternal Grandmother   . Heart disease  Maternal Grandfather   . Heart disease Paternal Grandfather   . Colon cancer Neg Hx    Social History   Occupational History  . Not on file.   Social History Main Topics  . Smoking status: Never Smoker  . Smokeless tobacco: Never Used  . Alcohol use No  . Drug use: No  . Sexual activity: Not on file    Tobacco Counseling Counseling given: Not Answered   Activities of Daily Living In your present state of health, do you have any difficulty performing the following activities: 08/27/2016  Hearing? Y  Vision? Y  Difficulty concentrating or making decisions? Y  Walking or climbing stairs? Y  Dressing or bathing? Y  Doing errands, shopping? Y  Preparing Food and eating ? Y    Using the Toilet? Y  In the past six months, have you accidently leaked urine? Y  Do you have problems with loss of bowel control? Y  Managing your Medications? Y  Managing your Finances? Y  Housekeeping or managing your Housekeeping? Y  Some recent data might be hidden    Immunizations and Health Maintenance Immunization History  Administered Date(s) Administered  . Influenza Inj Mdck Quad Pf 12/22/2013  . Influenza, Seasonal, Injecte, Preservative Fre 12/02/2010, 12/22/2013  . Influenza,trivalent, recombinat, inj, PF 12/02/2010  . Influenza-Unspecified 12/06/2015  . PPD Test 11/07/2014, 06/08/2015, 06/25/2015  . Pneumococcal Conjugate-13 03/10/1998  . Pneumococcal Polysaccharide-23 10/23/2010  . Zoster 05/05/2007   There are no preventive care reminders to display for this patient.  Patient Care Team: Hennie Duos, MD as PCP - General (Internal Medicine)  Indicate any recent Medical Services you may have received from other than [facility] providers in the past year (date may be approximate).     Assessment:   This is a routine wellness examination for Emma Mcguire.   Hearing/Vision screen No exam data present  Dietary issues and exercise activities discussed: Current Exercise Habits: The patient does not participate in regular exercise at present, Exercise limited by: Other - see comments (hopsice)  Goals    None     Depression Screen PHQ 2/9 Scores 08/27/2016  Exception Documentation Medical reason    Fall Risk Fall Risk  08/27/2016 01/03/2015  Falls in the past year? No No    Cognitive Function: MMSE - Mini Mental State Exam 08/27/2016  Not completed: Unable to complete        Screening Tests Health Maintenance  Topic Date Due  . HEMOGLOBIN A1C  03/10/2017 (Originally 07/12/2015)  . URINE MICROALBUMIN  03/10/2017 (Originally 07/15/1937)  . DEXA SCAN  03/11/2023 (Originally 07/15/1994)  . TETANUS/TDAP  03/11/2023 (Originally 07/14/1946)  . FOOT EXAM  07/15/2023  (Originally 07/15/1937)  . OPHTHALMOLOGY EXAM  07/15/2023 (Originally 07/15/1937)  . INFLUENZA VACCINE  10/08/2016  . PNA vac Low Risk Adult  Completed      Plan:    I have personally reviewed and addressed the Medicare Annual Wellness questionnaire and have noted the following in the patient's chart:  A. Medical and social history B. Use of alcohol, tobacco or illicit drugs  C. Current medications and supplements D. Functional ability and status E.  Nutritional status F.  Physical activity G. Advance directives H. List of other physicians I.  Hospitalizations, surgeries, and ER visits in previous 12 months J.  [place] to include hearing, vision, cognitive, depression L. Referrals and appointments - none  See attached scanned questionnaire for additional information.   Signed,   Rich Reining, RN Nurse Health  Advisor   Quick Notes   Health Maintenance: None, hospice pt     Abnormal Screen: None     Patient Concerns: None     Nurse Concerns: None

## 2016-08-27 NOTE — Patient Instructions (Signed)
Ms. Emma Mcguire , Thank you for taking time to come for your Medicare Wellness Visit. I appreciate your ongoing commitment to your health goals. Please review the following plan we discussed and let me know if I can assist you in the future.   Screening recommendations/referrals: Colonoscopy-hospice Mammogram-hospice Bone Density-hospice Recommended yearly ophthalmology/optometry visit for glaucoma screening and checkup Recommended yearly dental visit for hygiene and checkup  Vaccinations: Influenza vaccine-hospice Pneumococcal vaccine-hospice Tdap vaccine-hospice Shingles vaccine-hospice  Advanced directives: DNR in chart  Conditions/risks identified: None  Next appointment: Dr. Sheppard Coil makes monthly rounds   Preventive Care 38 Years and Older, Female Preventive care refers to lifestyle choices and visits with your health care provider that can promote health and wellness. What does preventive care include?  A yearly physical exam. This is also called an annual well check.  Dental exams once or twice a year.  Routine eye exams. Ask your health care provider how often you should have your eyes checked.  Personal lifestyle choices, including:  Daily care of your teeth and gums.  Regular physical activity.  Eating a healthy diet.  Avoiding tobacco and drug use.  Limiting alcohol use.  Practicing safe sex.  Taking low-dose aspirin every day.  Taking vitamin and mineral supplements as recommended by your health care provider. What happens during an annual well check? The services and screenings done by your health care provider during your annual well check will depend on your age, overall health, lifestyle risk factors, and family history of disease. Counseling  Your health care provider may ask you questions about your:  Alcohol use.  Tobacco use.  Drug use.  Emotional well-being.  Home and relationship well-being.  Sexual activity.  Eating  habits.  History of falls.  Memory and ability to understand (cognition).  Work and work Statistician.  Reproductive health. Screening  You may have the following tests or measurements:  Height, weight, and BMI.  Blood pressure.  Lipid and cholesterol levels. These may be checked every 5 years, or more frequently if you are over 55 years old.  Skin check.  Lung cancer screening. You may have this screening every year starting at age 81 if you have a 30-pack-year history of smoking and currently smoke or have quit within the past 15 years.  Fecal occult blood test (FOBT) of the stool. You may have this test every year starting at age 23.  Flexible sigmoidoscopy or colonoscopy. You may have a sigmoidoscopy every 5 years or a colonoscopy every 10 years starting at age 68.  Hepatitis C blood test.  Hepatitis B blood test.  Sexually transmitted disease (STD) testing.  Diabetes screening. This is done by checking your blood sugar (glucose) after you have not eaten for a while (fasting). You may have this done every 1-3 years.  Bone density scan. This is done to screen for osteoporosis. You may have this done starting at age 58.  Mammogram. This may be done every 1-2 years. Talk to your health care provider about how often you should have regular mammograms. Talk with your health care provider about your test results, treatment options, and if necessary, the need for more tests. Vaccines  Your health care provider may recommend certain vaccines, such as:  Influenza vaccine. This is recommended every year.  Tetanus, diphtheria, and acellular pertussis (Tdap, Td) vaccine. You may need a Td booster every 10 years.  Zoster vaccine. You may need this after age 8.  Pneumococcal 13-valent conjugate (PCV13) vaccine. One dose is recommended  after age 62.  Pneumococcal polysaccharide (PPSV23) vaccine. One dose is recommended after age 54. Talk to your health care provider about which  screenings and vaccines you need and how often you need them. This information is not intended to replace advice given to you by your health care provider. Make sure you discuss any questions you have with your health care provider. Document Released: 03/23/2015 Document Revised: 11/14/2015 Document Reviewed: 12/26/2014 Elsevier Interactive Patient Education  2017 [employer] Prevention in the Home Falls can cause injuries. They can happen to people of all ages. There are many things you can do to make your home safe and to help prevent falls. What can I do on the outside of my home?  Regularly fix the edges of walkways and driveways and fix any cracks.  Remove anything that might make you trip as you walk through a door, such as a raised step or threshold.  Trim any bushes or trees on the path to your home.  Use bright outdoor lighting.  Clear any walking paths of anything that might make someone trip, such as rocks or tools.  Regularly check to see if handrails are loose or broken. Make sure that both sides of any steps have handrails.  Any raised decks and porches should have guardrails on the edges.  Have any leaves, snow, or ice cleared regularly.  Use sand or salt on walking paths during winter.  Clean up any spills in your garage right away. This includes oil or grease spills. What can I do in the bathroom?  Use night lights.  Install grab bars by the toilet and in the tub and shower. Do not use towel bars as grab bars.  Use non-skid mats or decals in the tub or shower.  If you need to sit down in the shower, use a plastic, non-slip stool.  Keep the floor dry. Clean up any water that spills on the floor as soon as it happens.  Remove soap buildup in the tub or shower regularly.  Attach bath mats securely with double-sided non-slip rug tape.  Do not have throw rugs and other things on the floor that can make you trip. What can I do in the bedroom?  Use  night lights.  Make sure that you have a light by your bed that is easy to reach.  Do not use any sheets or blankets that are too big for your bed. They should not hang down onto the floor.  Have a firm chair that has side arms. You can use this for support while you get dressed.  Do not have throw rugs and other things on the floor that can make you trip. What can I do in the kitchen?  Clean up any spills right away.  Avoid walking on wet floors.  Keep items that you use a lot in easy-to-reach places.  If you need to reach something above you, use a strong step stool that has a grab bar.  Keep electrical cords out of the way.  Do not use floor polish or wax that makes floors slippery. If you must use wax, use non-skid floor wax.  Do not have throw rugs and other things on the floor that can make you trip. What can I do with my stairs?  Do not leave any items on the stairs.  Make sure that there are handrails on both sides of the stairs and use them. Fix handrails that are broken or loose. Make sure  that handrails are as long as the stairways.  Check any carpeting to make sure that it is firmly attached to the stairs. Fix any carpet that is loose or worn.  Avoid having throw rugs at the top or bottom of the stairs. If you do have throw rugs, attach them to the floor with carpet tape.  Make sure that you have a light switch at the top of the stairs and the bottom of the stairs. If you do not have them, ask someone to add them for you. What else can I do to help prevent falls?  Wear shoes that:  Do not have high heels.  Have rubber bottoms.  Are comfortable and fit you well.  Are closed at the toe. Do not wear sandals.  If you use a stepladder:  Make sure that it is fully opened. Do not climb a closed stepladder.  Make sure that both sides of the stepladder are locked into place.  Ask someone to hold it for you, if possible.  Clearly mark and make sure that you  can see:  Any grab bars or handrails.  First and last steps.  Where the edge of each step is.  Use tools that help you move around (mobility aids) if they are needed. These include:  Canes.  Walkers.  Scooters.  Crutches.  Turn on the lights when you go into a dark area. Replace any light bulbs as soon as they burn out.  Set up your furniture so you have a clear path. Avoid moving your furniture around.  If any of your floors are uneven, fix them.  If there are any pets around you, be aware of where they are.  Review your medicines with your doctor. Some medicines can make you feel dizzy. This can increase your chance of falling. Ask your doctor what other things that you can do to help prevent falls. This information is not intended to replace advice given to you by your health care provider. Make sure you discuss any questions you have with your health care provider. Document Released: 12/21/2008 Document Revised: 08/02/2015 Document Reviewed: 03/31/2014 Elsevier Interactive Patient Education  2017 [employer].

## 2016-08-29 ENCOUNTER — Other Ambulatory Visit: Payer: Self-pay | Admitting: *Deleted

## 2016-08-29 DIAGNOSIS — N189 Chronic kidney disease, unspecified: Secondary | ICD-10-CM | POA: Diagnosis not present

## 2016-08-29 DIAGNOSIS — G3183 Dementia with Lewy bodies: Secondary | ICD-10-CM | POA: Diagnosis not present

## 2016-08-29 DIAGNOSIS — I4891 Unspecified atrial fibrillation: Secondary | ICD-10-CM | POA: Diagnosis not present

## 2016-08-29 DIAGNOSIS — I429 Cardiomyopathy, unspecified: Secondary | ICD-10-CM | POA: Diagnosis not present

## 2016-08-29 DIAGNOSIS — I359 Nonrheumatic aortic valve disorder, unspecified: Secondary | ICD-10-CM | POA: Diagnosis not present

## 2016-08-29 DIAGNOSIS — I679 Cerebrovascular disease, unspecified: Secondary | ICD-10-CM | POA: Diagnosis not present

## 2016-08-29 MED ORDER — AMBULATORY NON FORMULARY MEDICATION: 0 refills | Status: DC

## 2016-08-29 NOTE — Telephone Encounter (Signed)
[facility] #[phone] Fax: [phone]  

## 2016-09-01 DIAGNOSIS — I679 Cerebrovascular disease, unspecified: Secondary | ICD-10-CM | POA: Diagnosis not present

## 2016-09-01 DIAGNOSIS — I359 Nonrheumatic aortic valve disorder, unspecified: Secondary | ICD-10-CM | POA: Diagnosis not present

## 2016-09-01 DIAGNOSIS — I429 Cardiomyopathy, unspecified: Secondary | ICD-10-CM | POA: Diagnosis not present

## 2016-09-01 DIAGNOSIS — N189 Chronic kidney disease, unspecified: Secondary | ICD-10-CM | POA: Diagnosis not present

## 2016-09-01 DIAGNOSIS — I4891 Unspecified atrial fibrillation: Secondary | ICD-10-CM | POA: Diagnosis not present

## 2016-09-01 DIAGNOSIS — G3183 Dementia with Lewy bodies: Secondary | ICD-10-CM | POA: Diagnosis not present

## 2016-09-02 DIAGNOSIS — I679 Cerebrovascular disease, unspecified: Secondary | ICD-10-CM | POA: Diagnosis not present

## 2016-09-02 DIAGNOSIS — I359 Nonrheumatic aortic valve disorder, unspecified: Secondary | ICD-10-CM | POA: Diagnosis not present

## 2016-09-02 DIAGNOSIS — I429 Cardiomyopathy, unspecified: Secondary | ICD-10-CM | POA: Diagnosis not present

## 2016-09-02 DIAGNOSIS — G3183 Dementia with Lewy bodies: Secondary | ICD-10-CM | POA: Diagnosis not present

## 2016-09-02 DIAGNOSIS — N189 Chronic kidney disease, unspecified: Secondary | ICD-10-CM | POA: Diagnosis not present

## 2016-09-02 DIAGNOSIS — I4891 Unspecified atrial fibrillation: Secondary | ICD-10-CM | POA: Diagnosis not present

## 2016-09-03 DIAGNOSIS — I679 Cerebrovascular disease, unspecified: Secondary | ICD-10-CM | POA: Diagnosis not present

## 2016-09-03 DIAGNOSIS — I429 Cardiomyopathy, unspecified: Secondary | ICD-10-CM | POA: Diagnosis not present

## 2016-09-03 DIAGNOSIS — G3183 Dementia with Lewy bodies: Secondary | ICD-10-CM | POA: Diagnosis not present

## 2016-09-03 DIAGNOSIS — N189 Chronic kidney disease, unspecified: Secondary | ICD-10-CM | POA: Diagnosis not present

## 2016-09-03 DIAGNOSIS — I4891 Unspecified atrial fibrillation: Secondary | ICD-10-CM | POA: Diagnosis not present

## 2016-09-03 DIAGNOSIS — I359 Nonrheumatic aortic valve disorder, unspecified: Secondary | ICD-10-CM | POA: Diagnosis not present

## 2016-09-05 DIAGNOSIS — I4891 Unspecified atrial fibrillation: Secondary | ICD-10-CM | POA: Diagnosis not present

## 2016-09-05 DIAGNOSIS — G3183 Dementia with Lewy bodies: Secondary | ICD-10-CM | POA: Diagnosis not present

## 2016-09-05 DIAGNOSIS — I359 Nonrheumatic aortic valve disorder, unspecified: Secondary | ICD-10-CM | POA: Diagnosis not present

## 2016-09-05 DIAGNOSIS — I429 Cardiomyopathy, unspecified: Secondary | ICD-10-CM | POA: Diagnosis not present

## 2016-09-05 DIAGNOSIS — N189 Chronic kidney disease, unspecified: Secondary | ICD-10-CM | POA: Diagnosis not present

## 2016-09-05 DIAGNOSIS — I679 Cerebrovascular disease, unspecified: Secondary | ICD-10-CM | POA: Diagnosis not present

## 2016-09-07 DIAGNOSIS — E1159 Type 2 diabetes mellitus with other circulatory complications: Secondary | ICD-10-CM | POA: Diagnosis not present

## 2016-09-07 DIAGNOSIS — R1310 Dysphagia, unspecified: Secondary | ICD-10-CM | POA: Diagnosis not present

## 2016-09-07 DIAGNOSIS — L8990 Pressure ulcer of unspecified site, unspecified stage: Secondary | ICD-10-CM | POA: Diagnosis not present

## 2016-09-07 DIAGNOSIS — G9009 Other idiopathic peripheral autonomic neuropathy: Secondary | ICD-10-CM | POA: Diagnosis not present

## 2016-09-07 DIAGNOSIS — G3183 Dementia with Lewy bodies: Secondary | ICD-10-CM | POA: Diagnosis not present

## 2016-09-07 DIAGNOSIS — M2450 Contracture, unspecified joint: Secondary | ICD-10-CM | POA: Diagnosis not present

## 2016-09-07 DIAGNOSIS — K219 Gastro-esophageal reflux disease without esophagitis: Secondary | ICD-10-CM | POA: Diagnosis not present

## 2016-09-07 DIAGNOSIS — I679 Cerebrovascular disease, unspecified: Secondary | ICD-10-CM | POA: Diagnosis not present

## 2016-09-07 DIAGNOSIS — I4891 Unspecified atrial fibrillation: Secondary | ICD-10-CM | POA: Diagnosis not present

## 2016-09-07 DIAGNOSIS — J309 Allergic rhinitis, unspecified: Secondary | ICD-10-CM | POA: Diagnosis not present

## 2016-09-07 DIAGNOSIS — R630 Anorexia: Secondary | ICD-10-CM | POA: Diagnosis not present

## 2016-09-07 DIAGNOSIS — F319 Bipolar disorder, unspecified: Secondary | ICD-10-CM | POA: Diagnosis not present

## 2016-09-07 DIAGNOSIS — I10 Essential (primary) hypertension: Secondary | ICD-10-CM | POA: Diagnosis not present

## 2016-09-07 DIAGNOSIS — N189 Chronic kidney disease, unspecified: Secondary | ICD-10-CM | POA: Diagnosis not present

## 2016-09-07 DIAGNOSIS — E785 Hyperlipidemia, unspecified: Secondary | ICD-10-CM | POA: Diagnosis not present

## 2016-09-07 DIAGNOSIS — I429 Cardiomyopathy, unspecified: Secondary | ICD-10-CM | POA: Diagnosis not present

## 2016-09-07 DIAGNOSIS — R634 Abnormal weight loss: Secondary | ICD-10-CM | POA: Diagnosis not present

## 2016-09-07 DIAGNOSIS — I359 Nonrheumatic aortic valve disorder, unspecified: Secondary | ICD-10-CM | POA: Diagnosis not present

## 2016-09-08 DIAGNOSIS — I4891 Unspecified atrial fibrillation: Secondary | ICD-10-CM | POA: Diagnosis not present

## 2016-09-08 DIAGNOSIS — I429 Cardiomyopathy, unspecified: Secondary | ICD-10-CM | POA: Diagnosis not present

## 2016-09-08 DIAGNOSIS — I359 Nonrheumatic aortic valve disorder, unspecified: Secondary | ICD-10-CM | POA: Diagnosis not present

## 2016-09-08 DIAGNOSIS — L97511 Non-pressure chronic ulcer of other part of right foot limited to breakdown of skin: Secondary | ICD-10-CM | POA: Diagnosis not present

## 2016-09-08 DIAGNOSIS — I679 Cerebrovascular disease, unspecified: Secondary | ICD-10-CM | POA: Diagnosis not present

## 2016-09-08 DIAGNOSIS — N189 Chronic kidney disease, unspecified: Secondary | ICD-10-CM | POA: Diagnosis not present

## 2016-09-08 DIAGNOSIS — L89220 Pressure ulcer of left hip, unstageable: Secondary | ICD-10-CM | POA: Diagnosis not present

## 2016-09-08 DIAGNOSIS — L89104 Pressure ulcer of unspecified part of back, stage 4: Secondary | ICD-10-CM | POA: Diagnosis not present

## 2016-09-08 DIAGNOSIS — G3183 Dementia with Lewy bodies: Secondary | ICD-10-CM | POA: Diagnosis not present

## 2016-09-08 DIAGNOSIS — L89154 Pressure ulcer of sacral region, stage 4: Secondary | ICD-10-CM | POA: Diagnosis not present

## 2016-09-11 DIAGNOSIS — I679 Cerebrovascular disease, unspecified: Secondary | ICD-10-CM | POA: Diagnosis not present

## 2016-09-11 DIAGNOSIS — N189 Chronic kidney disease, unspecified: Secondary | ICD-10-CM | POA: Diagnosis not present

## 2016-09-11 DIAGNOSIS — I4891 Unspecified atrial fibrillation: Secondary | ICD-10-CM | POA: Diagnosis not present

## 2016-09-11 DIAGNOSIS — I429 Cardiomyopathy, unspecified: Secondary | ICD-10-CM | POA: Diagnosis not present

## 2016-09-11 DIAGNOSIS — I359 Nonrheumatic aortic valve disorder, unspecified: Secondary | ICD-10-CM | POA: Diagnosis not present

## 2016-09-11 DIAGNOSIS — G3183 Dementia with Lewy bodies: Secondary | ICD-10-CM | POA: Diagnosis not present

## 2016-09-12 ENCOUNTER — Non-Acute Institutional Stay (SKILLED_NURSING_FACILITY): Payer: Medicare Other | Admitting: Internal Medicine

## 2016-09-12 ENCOUNTER — Encounter: Payer: Self-pay | Admitting: Internal Medicine

## 2016-09-12 DIAGNOSIS — G20 Parkinson's disease: Secondary | ICD-10-CM | POA: Diagnosis not present

## 2016-09-12 DIAGNOSIS — G3183 Dementia with Lewy bodies: Secondary | ICD-10-CM | POA: Diagnosis not present

## 2016-09-12 DIAGNOSIS — R627 Adult failure to thrive: Secondary | ICD-10-CM

## 2016-09-12 DIAGNOSIS — Z515 Encounter for palliative care: Secondary | ICD-10-CM

## 2016-09-12 DIAGNOSIS — I359 Nonrheumatic aortic valve disorder, unspecified: Secondary | ICD-10-CM | POA: Diagnosis not present

## 2016-09-12 DIAGNOSIS — I429 Cardiomyopathy, unspecified: Secondary | ICD-10-CM | POA: Diagnosis not present

## 2016-09-12 DIAGNOSIS — I4891 Unspecified atrial fibrillation: Secondary | ICD-10-CM | POA: Diagnosis not present

## 2016-09-12 DIAGNOSIS — N189 Chronic kidney disease, unspecified: Secondary | ICD-10-CM | POA: Diagnosis not present

## 2016-09-12 DIAGNOSIS — I679 Cerebrovascular disease, unspecified: Secondary | ICD-10-CM | POA: Diagnosis not present

## 2016-09-12 NOTE — Progress Notes (Signed)
Location:  [place] Room Number: [place] of Service:  SNF ([id number])  Hennie Duos, MD  Patient Care Team: Hennie Duos, MD as PCP - General (Internal Medicine)  Extended Emergency Contact Information Primary Emergency Contact: Shreffler,James Address: [address] [place], [place] [address] [place] Phone: [phone] Relation: Son Secondary Emergency Contact: Rudell Cobb [place] Phone: [phone] Relation: Other    Allergies: Cymbalta [duloxetine hcl]; Lipitor [atorvastatin]; Metformin and related; Simvastatin; and Verapamil  Chief Complaint  Patient presents with  . Medical Management of Chronic Issues    routine visit    HPI: Patient is 83 y.o. female who Who is being seen for routine issues; her only issue now is that she has gone into a deep decline and she is now acutely dying. Patient appears comfortable, patient is eating and drinking practically nothing medications will need to be stopped and morphine but in place.  Past Medical History:  Diagnosis Date  . Altered mental status 12/11/2014  . Alzheimer disease   . Atrial fibrillation ([place])   . Bunion   . Chronic kidney disease   . Colon polyp   . Corns and callosities   . CRI (chronic renal insufficiency) 12/16/2010   Nephrology, Laurance Flatten, MD  . Diabetes mellitus without complication ([place]) 50/27/7412  . Dizzy   . Dynamic left ventricular outflow obstruction 05/31/2012   Resting gradient 52 mm hg  . Fibromyalgia   . GERD (gastroesophageal reflux disease)   . HOCM (hypertrophic obstructive cardiomyopathy) ([place])   . Hypertension   . Neuropathy   . Pressure ulcer 11/24/2014  . Renal failure (ARF), acute on chronic ([place]) 11/24/2014  . Sick sinus syndrome ([place]) 05/07/2011    Past Surgical History:  Procedure Laterality Date  . COLONOSCOPY    . EYE SURGERY     cataract  . HEMORRHOID SURGERY    . SHOULDER SURGERY  Left    rotator cuff  . UPPER GASTROINTESTINAL ENDOSCOPY      Allergies as of 09/12/2016      Reactions   Cymbalta [duloxetine Hcl] Other (See Comments)   Diarrhea Listed on MAR   Lipitor [atorvastatin] Other (See Comments)   myalgias Listed on MAR   Metformin And Related Other (See Comments)   Listed on MAR   Simvastatin Other (See Comments)   Listed on MAR   Verapamil    Sinus arrest      Medication List       Accurate as of 09/12/16 12:58 PM. Always use your most recent med list.          atropine 1 % ophthalmic solution Apply 5 drops SL every 2 hours as needed for increase secretions.   morphine 20 MG/5ML solution Take 20 mg by mouth every 2 (two) hours as needed for pain.   OXYGEN Inhale into the lungs. 2 liter continuous for support and comfort   UNABLE TO FIND Med Name: Med Pass initiate 4 oz  three times daily       Meds ordered this encounter  Medications  . OXYGEN    Sig: Inhale into the lungs. 2 liter continuous for support and comfort  . atropine 1 % ophthalmic solution    Sig: Apply 5 drops SL every 2 hours as needed for increase secretions.    Immunization History  Administered Date(s) Administered  . Influenza Inj Mdck Quad Pf  12/22/2013  . Influenza, Seasonal, Injecte, Preservative Fre 12/02/2010, 12/22/2013  . Influenza,trivalent, recombinat, inj, PF 12/02/2010  . Influenza-Unspecified 12/06/2015  . PPD Test 11/07/2014, 06/08/2015, 06/25/2015  . Pneumococcal Conjugate-13 03/10/1998  . Pneumococcal Polysaccharide-23 10/23/2010  . Zoster 05/05/2007    Social History  Substance Use Topics  . Smoking status: Never Smoker  . Smokeless tobacco: Never Used  . Alcohol use No    Review of Systems-Unable to obtain secondary to condition and dementia    Vitals:   09/12/16 1233  BP: (!) 149/73  Pulse: 94  Resp: 20  Temp: (!) 97 F (36.1 C)   Body mass index is 16.72 kg/m. Physical Exam  GENERAL APPEARANCE: Eyes open, No acute  distress  SKIN: No diaphoresis rash HEENT: Unremarkable RESPIRATORY: Breathing is even, unlabored. Lung sounds are clear   CARDIOVASCULAR: Heart RRR no murmurs, rubs or gallops. No peripheral edema  GASTROINTESTINAL: Abdomen is soft, non-tender, not distended w/ normal bowel sounds.  GENITOURINARY: Bladder non tender, not distended  MUSCULOSKELETAL: Wasting and contractures NEUROLOGIC: Cranial nerves 2-12 grossly intact; quadriplegia with some minimal upper extremity movement PSYCHIATRIC: Sensorium is depressed mood is calm  Patient Active Problem List   Diagnosis Date Noted  . Bunion   . Corns and callosities   . Skin ulcers of both feet ([place]) 10/23/2015  . Vitamin B12 deficiency 10/23/2015  . FTT (failure to thrive) in adult 09/01/2015  . Lewy body dementia 09/01/2015  . Corn or callus 02/06/2015  . Dizziness 02/06/2015  . Neuropathy 02/06/2015  . Secondary parkinsonism ([place])   . Depression   . Anxiety   . Essential hypertension   . HLD (hyperlipidemia)   . Nonorganic psychosis   . Atrial fibrillation ([place]) 01/12/2015  . Sepsis ([place]) 01/12/2015  . Ischemic stroke ([place]) 01/12/2015  . Acute encephalopathy   . Paroxysmal atrial fibrillation ([facility])   . Schizophrenia ([place])   . Atrial fibrillation with RVR ([place]) 01/11/2015  . SVT (supraventricular tachycardia) ([place]) 01/11/2015  . Alzheimer disease   . Diabetes mellitus without complication ([place])   . HOCM (hypertrophic obstructive cardiomyopathy) ([place])   . HTN (hypertension) 01/07/2015  . Edema 01/06/2015  . Blister of skin without infection 01/05/2015  . Parkinsonian features 12/25/2014  . Weakness 12/18/2014  . Altered mental status 12/11/2014  . UTI (urinary tract infection) 12/02/2014  . GERD (gastroesophageal reflux disease) 12/02/2014  . Psychosis 12/02/2014  . Renal failure (ARF), acute on chronic ([facility]) 11/24/2014  . MDD (major depressive disorder) 11/24/2014  . Pressure ulcer 11/24/2014  . Elevated troponin  11/23/2014  . Difficulty hearing 11/07/2014  . Alzheimer's dementia with behavioral disturbance 11/03/2014  . Severe episode of recurrent major depressive disorder, without psychotic features ([place]) 11/02/2014  . Chronic kidney disease (CKD), stage III (moderate) 09/17/2014  . Rotator cuff arthropathy 07/21/2014  . Diabetic polyneuropathy ([place]) 05/21/2014  . Acid reflux 05/21/2014  . Aortic heart valve narrowing 02/24/2014  . Acquired deformities of toe 07/12/2013  . Hypertrophic obstructive cardiomyopathy ([place]) 07/04/2013  . Left ventricular outflow obstruction 05/31/2012  . Dynamic left ventricular outflow obstruction 05/31/2012  . Left ventricular hypertrophy 05/07/2011  . Sick sinus syndrome ([place]) 05/07/2011  . Cardiac conduction disorder 05/01/2011  . Diabetes mellitus ([place]) 05/01/2011  . Degeneration of intervertebral disc of lumbosacral region 01/27/2011  . Chronic kidney disease 12/16/2010  . Absence of bladder continence 12/16/2010  . Diverticulitis 06/03/2010  . Fibromyalgia 06/03/2010  . Arthritis, degenerative 06/03/2010  . Adenomatous colon polyp 08/03/2006    CMP  Component Value Date/Time   NA 137 04/11/2016   K 4.5 04/11/2016   CL 113 (H) 01/15/2015 0518   CO2 18 (L) 01/15/2015 0518   GLUCOSE 139 (H) 01/15/2015 0518   BUN 14 04/11/2016   CREATININE 0.5 04/11/2016   CREATININE 0.69 01/15/2015 0518   CALCIUM 8.9 01/15/2015 0518   PROT 4.9 (L) 01/14/2015 0154   ALBUMIN 2.0 (L) 01/14/2015 0154   AST 25 01/14/2015 0154   ALT 31 01/14/2015 0154   ALKPHOS 93 01/14/2015 0154   BILITOT 1.1 01/14/2015 0154   GFRNONAA >60 01/15/2015 0518   GFRAA >60 01/15/2015 0518    Recent Labs  04/11/16  NA 137  K 4.5  BUN 14  CREATININE 0.5   No results for input(s): AST, ALT, ALKPHOS, BILITOT, PROT, ALBUMIN in the last 8760 hours. No results for input(s): WBC, NEUTROABS, HGB, HCT, MCV, PLT in the last 8760 hours. No results for input(s): CHOL, LDLCALC, TRIG in the  last 8760 hours.  Invalid input(s): HCL No results found for: [facility] Lab Results  Component Value Date   TSH 3.421 01/12/2015   Lab Results  Component Value Date   HGBA1C 6.1 (H) 01/12/2015   Lab Results  Component Value Date   CHOL 217 (H) 01/12/2015   HDL 20 (L) 01/12/2015   LDLCALC 156 (H) 01/12/2015   TRIG 207 (H) 01/12/2015   CHOLHDL 10.9 01/12/2015    Significant Diagnostic Results in last 30 days:  No results found.  Assessment and Plan  IN STAGE PARKINSON'S/FTT/DYING- patient is for comfort care completely; his medicines have been stopped or stopping; morphine 20 mg/ml 5 mg every 2 hours when necessary; patient has when necessary O2 available; with hospice will continue supportive care    Time spent greater than 25 minutes; in discussion with hospice, orders and patient visit Webb Silversmith D. Sheppard Coil, MD

## 2016-09-21 ENCOUNTER — Encounter: Payer: Self-pay | Admitting: Internal Medicine

## 2016-09-21 NOTE — Assessment & Plan Note (Signed)
Controlled; continue Norvasc 5 mg by mouth daily and metoprolol 75 mg by mouth twice a day

## 2016-09-21 NOTE — Assessment & Plan Note (Signed)
Mood is very stable plan to continue Depakote 250 mg every morning and 375 mg by mouth daily at bedtime

## 2016-09-21 NOTE — Assessment & Plan Note (Signed)
No exacerbation; continue with metoprolol 75 mg twice a day

## 2016-09-28 ENCOUNTER — Encounter: Payer: Self-pay | Admitting: Internal Medicine

## 2016-10-08 DEATH — deceased

## 2016-12-04 ENCOUNTER — Encounter: Payer: Self-pay | Admitting: Internal Medicine
